# Patient Record
Sex: Male | Born: 1960 | Race: White | Hispanic: No | Marital: Married
Health system: Midwestern US, Community
[De-identification: ages and names within clinical notes are randomized; demographics above are authoritative.]

## PROBLEM LIST (undated history)

## (undated) DIAGNOSIS — F1021 Alcohol dependence, in remission: Secondary | ICD-10-CM

## (undated) DIAGNOSIS — I472 Ventricular tachycardia: Secondary | ICD-10-CM

## (undated) DIAGNOSIS — I4729 Other ventricular tachycardia: Secondary | ICD-10-CM

## (undated) DIAGNOSIS — Z789 Other specified health status: Secondary | ICD-10-CM

## (undated) DIAGNOSIS — E785 Hyperlipidemia, unspecified: Secondary | ICD-10-CM

## (undated) DIAGNOSIS — I255 Ischemic cardiomyopathy: Secondary | ICD-10-CM

## (undated) DIAGNOSIS — I252 Old myocardial infarction: Secondary | ICD-10-CM

## (undated) DIAGNOSIS — I10 Essential (primary) hypertension: Secondary | ICD-10-CM

## (undated) DIAGNOSIS — F329 Major depressive disorder, single episode, unspecified: Secondary | ICD-10-CM

## (undated) DIAGNOSIS — I2510 Atherosclerotic heart disease of native coronary artery without angina pectoris: Secondary | ICD-10-CM

## (undated) HISTORY — PX: CORONARY ANGIOPLASTY WITH STENT PLACEMENT: SHX49

## (undated) HISTORY — DX: Major depressive disorder, single episode, unspecified: F32.9

## (undated) HISTORY — DX: Other specified health status: Z78.9

## (undated) HISTORY — PX: CARDIAC CATHETERIZATION: SHX172

## (undated) HISTORY — DX: Ischemic cardiomyopathy: I25.5

## (undated) HISTORY — PX: WRIST SURGERY: SHX841

## (undated) HISTORY — PX: TONSILLECTOMY: SUR1361

## (undated) HISTORY — DX: Alcohol dependence, in remission: F10.21

## (undated) HISTORY — DX: Old myocardial infarction: I25.2

---

## 1998-05-24 ENCOUNTER — Emergency Department (HOSPITAL_COMMUNITY): Admission: EM | Admit: 1998-05-24 | Discharge: 1998-05-25 | Payer: Self-pay | Admitting: Emergency Medicine

## 1998-06-02 ENCOUNTER — Emergency Department (HOSPITAL_COMMUNITY): Admission: EM | Admit: 1998-06-02 | Discharge: 1998-06-02 | Payer: Self-pay | Admitting: Emergency Medicine

## 2006-06-06 ENCOUNTER — Emergency Department (HOSPITAL_COMMUNITY): Admission: EM | Admit: 2006-06-06 | Discharge: 2006-06-06 | Payer: Self-pay | Admitting: Emergency Medicine

## 2014-02-19 DIAGNOSIS — I252 Old myocardial infarction: Secondary | ICD-10-CM

## 2014-02-19 HISTORY — DX: Old myocardial infarction: I25.2

## 2014-02-28 ENCOUNTER — Inpatient Hospital Stay (HOSPITAL_COMMUNITY)
Admission: EM | Admit: 2014-02-28 | Discharge: 2014-03-01 | DRG: 247 | Disposition: A | Discharge status: Home / Self Care | Payer: Self-pay | Attending: Cardiovascular Disease | Admitting: Cardiovascular Disease

## 2014-02-28 ENCOUNTER — Other Ambulatory Visit: Payer: Self-pay

## 2014-02-28 ENCOUNTER — Emergency Department (HOSPITAL_COMMUNITY): Payer: Self-pay

## 2014-02-28 ENCOUNTER — Encounter (HOSPITAL_COMMUNITY)
Admission: EM | Disposition: A | Discharge status: Home / Self Care | Payer: Self-pay | Source: Home / Self Care | Attending: Cardiovascular Disease

## 2014-02-28 ENCOUNTER — Encounter (HOSPITAL_COMMUNITY): Payer: Self-pay | Admitting: Emergency Medicine

## 2014-02-28 DIAGNOSIS — I213 ST elevation (STEMI) myocardial infarction of unspecified site: Secondary | ICD-10-CM

## 2014-02-28 DIAGNOSIS — E780 Pure hypercholesterolemia: Secondary | ICD-10-CM | POA: Diagnosis present

## 2014-02-28 DIAGNOSIS — R072 Precordial pain: Secondary | ICD-10-CM

## 2014-02-28 DIAGNOSIS — I252 Old myocardial infarction: Secondary | ICD-10-CM | POA: Diagnosis present

## 2014-02-28 DIAGNOSIS — I2510 Atherosclerotic heart disease of native coronary artery without angina pectoris: Secondary | ICD-10-CM

## 2014-02-28 DIAGNOSIS — I472 Ventricular tachycardia: Secondary | ICD-10-CM | POA: Diagnosis not present

## 2014-02-28 DIAGNOSIS — I2119 ST elevation (STEMI) myocardial infarction involving other coronary artery of inferior wall: Principal | ICD-10-CM | POA: Diagnosis present

## 2014-02-28 DIAGNOSIS — I959 Hypotension, unspecified: Secondary | ICD-10-CM | POA: Diagnosis present

## 2014-02-28 DIAGNOSIS — R001 Bradycardia, unspecified: Secondary | ICD-10-CM | POA: Diagnosis present

## 2014-02-28 DIAGNOSIS — I4729 Other ventricular tachycardia: Secondary | ICD-10-CM

## 2014-02-28 DIAGNOSIS — Z8249 Family history of ischemic heart disease and other diseases of the circulatory system: Secondary | ICD-10-CM

## 2014-02-28 DIAGNOSIS — I255 Ischemic cardiomyopathy: Secondary | ICD-10-CM | POA: Diagnosis present

## 2014-02-28 DIAGNOSIS — I2109 ST elevation (STEMI) myocardial infarction involving other coronary artery of anterior wall: Secondary | ICD-10-CM

## 2014-02-28 DIAGNOSIS — F419 Anxiety disorder, unspecified: Secondary | ICD-10-CM | POA: Diagnosis present

## 2014-02-28 HISTORY — PX: LEFT HEART CATH: SHX5478

## 2014-02-28 LAB — MRSA PCR SCREENING: MRSA by PCR: NEGATIVE

## 2014-02-28 LAB — LIPID PANEL
Cholesterol: 163 mg/dL (ref 0–200)
HDL: 54 mg/dL (ref >39)
LDL CALC: 94 mg/dL (ref 0–99)
Total CHOL/HDL Ratio: 3 RATIO
Triglycerides: 73 mg/dL (ref <150)
VLDL: 15 mg/dL (ref 0–40)

## 2014-02-28 LAB — BASIC METABOLIC PANEL
Anion gap: 14 (ref 5–15)
BUN: 12 mg/dL (ref 6–23)
CO2: 22 meq/L (ref 19–32)
Calcium: 8.4 mg/dL (ref 8.4–10.5)
Chloride: 105 mEq/L (ref 96–112)
Creatinine, Ser: 0.8 mg/dL (ref 0.50–1.35)
GFR calc Af Amer: >90 mL/min (ref >90)
GFR calc non Af Amer: >90 mL/min (ref >90)
GLUCOSE: 118 mg/dL — AB (ref 70–99)
POTASSIUM: 4.3 meq/L (ref 3.7–5.3)
SODIUM: 141 meq/L (ref 137–147)

## 2014-02-28 LAB — CBC
HEMATOCRIT: 38.6 % — AB (ref 39.0–52.0)
Hemoglobin: 13.7 g/dL (ref 13.0–17.0)
MCH: 33.2 pg (ref 26.0–34.0)
MCHC: 35.5 g/dL (ref 30.0–36.0)
MCV: 93.5 fL (ref 78.0–100.0)
PLATELETS: 210 10*3/uL (ref 150–400)
RBC: 4.13 MIL/uL — AB (ref 4.22–5.81)
RDW: 12.1 % (ref 11.5–15.5)
WBC: 8.1 10*3/uL (ref 4.0–10.5)

## 2014-02-28 LAB — GLUCOSE, CAPILLARY: GLUCOSE-CAPILLARY: 110 mg/dL — AB (ref 70–99)

## 2014-02-28 LAB — HEMOGLOBIN A1C
Hgb A1c MFr Bld: 5.3 % (ref <5.7)
MEAN PLASMA GLUCOSE: 105 mg/dL (ref <117)

## 2014-02-28 LAB — TROPONIN I: Troponin I: 6.18 ng/mL (ref <0.30)

## 2014-02-28 SURGERY — LEFT HEART CATH
Anesthesia: LOCAL | Laterality: Bilateral

## 2014-02-28 MED ORDER — LISINOPRIL 5 MG PO TABS
5.0000 mg | ORAL_TABLET | Freq: Every day | ORAL | Status: DC
  Administered 2014-02-28 – 2014-03-01 (×2): 5 mg via ORAL
  Filled 2014-02-28 (×2): qty 1

## 2014-02-28 MED ORDER — SODIUM CHLORIDE 0.9 % IV SOLN
INTRAVENOUS | Status: AC
  Administered 2014-02-28 (×2): via INTRAVENOUS

## 2014-02-28 MED ORDER — TICAGRELOR 90 MG PO TABS
ORAL_TABLET | ORAL | Status: AC
  Filled 2014-02-28: qty 2

## 2014-02-28 MED ORDER — ONDANSETRON HCL 4 MG/2ML IJ SOLN: 4.0000 mg | Freq: Four times a day (QID) | INTRAMUSCULAR | Status: DC | PRN

## 2014-02-28 MED ORDER — BIVALIRUDIN 250 MG IV SOLR
INTRAVENOUS | Status: AC
  Filled 2014-02-28: qty 250

## 2014-02-28 MED ORDER — FENTANYL CITRATE 0.05 MG/ML IJ SOLN
INTRAMUSCULAR | Status: AC
  Filled 2014-02-28: qty 2

## 2014-02-28 MED ORDER — TICAGRELOR 90 MG PO TABS
ORAL_TABLET | ORAL | Status: DC
Start: 2014-02-28 — End: 2014-02-28
  Filled 2014-02-28: qty 2

## 2014-02-28 MED ORDER — NITROGLYCERIN 1 MG/10 ML FOR IR/CATH LAB
INTRA_ARTERIAL | Status: AC
  Filled 2014-02-28: qty 10

## 2014-02-28 MED ORDER — ACETAMINOPHEN 325 MG PO TABS
650.0000 mg | ORAL_TABLET | ORAL | Status: DC | PRN
  Administered 2014-02-28 – 2014-03-01 (×2): 650 mg via ORAL
  Filled 2014-02-28 (×2): qty 2

## 2014-02-28 MED ORDER — MORPHINE SULFATE 4 MG/ML IJ SOLN
4.0000 mg | Freq: Once | INTRAMUSCULAR | Status: AC
  Administered 2014-02-28: 4 mg via INTRAVENOUS

## 2014-02-28 MED ORDER — BIVALIRUDIN BOLUS VIA INFUSION
0.1000 mg/kg | Freq: Once | INTRAVENOUS | Status: DC
  Filled 2014-02-28: qty 10

## 2014-02-28 MED ORDER — MORPHINE SULFATE 4 MG/ML IJ SOLN
INTRAMUSCULAR | Status: AC
  Filled 2014-02-28: qty 1

## 2014-02-28 MED ORDER — ATORVASTATIN CALCIUM 80 MG PO TABS
80.0000 mg | ORAL_TABLET | Freq: Every day | ORAL | Status: DC
  Administered 2014-02-28: 80 mg via ORAL
  Filled 2014-02-28 (×2): qty 1

## 2014-02-28 MED ORDER — CARVEDILOL 3.125 MG PO TABS
3.1250 mg | ORAL_TABLET | Freq: Two times a day (BID) | ORAL | Status: DC
  Administered 2014-02-28 (×2): 3.125 mg via ORAL
  Administered 2014-03-01: 6.25 mg via ORAL
  Filled 2014-02-28 (×5): qty 1

## 2014-02-28 MED ORDER — NITROGLYCERIN IN D5W 200-5 MCG/ML-% IV SOLN
INTRAVENOUS | Status: AC
  Filled 2014-02-28: qty 250

## 2014-02-28 MED ORDER — LIDOCAINE HCL (PF) 1 % IJ SOLN
INTRAMUSCULAR | Status: AC
  Filled 2014-02-28: qty 30

## 2014-02-28 MED ORDER — SODIUM CHLORIDE 0.9 % IV SOLN
0.2500 mg/kg/h | INTRAVENOUS | Status: AC
  Administered 2014-02-28: 0.25 mg/kg/h via INTRAVENOUS
  Filled 2014-02-28: qty 250

## 2014-02-28 MED ORDER — TICAGRELOR 90 MG PO TABS
90.0000 mg | ORAL_TABLET | Freq: Two times a day (BID) | ORAL | Status: DC
  Administered 2014-02-28 – 2014-03-01 (×3): 90 mg via ORAL
  Filled 2014-02-28 (×4): qty 1

## 2014-02-28 MED ORDER — METOPROLOL TARTRATE 25 MG PO TABS
25.0000 mg | ORAL_TABLET | Freq: Two times a day (BID) | ORAL | Status: DC
Start: 2014-02-28 — End: 2014-02-28

## 2014-02-28 MED ORDER — ASPIRIN 81 MG PO CHEW
81.0000 mg | CHEWABLE_TABLET | Freq: Every day | ORAL | Status: DC
  Administered 2014-02-28 – 2014-03-01 (×2): 81 mg via ORAL
  Filled 2014-02-28 (×2): qty 1

## 2014-02-28 MED ORDER — HEPARIN (PORCINE) IN NACL 2-0.9 UNIT/ML-% IJ SOLN
INTRAMUSCULAR | Status: AC
  Filled 2014-02-28: qty 1000

## 2014-02-28 MED ORDER — ATROPINE SULFATE 0.1 MG/ML IJ SOLN
INTRAMUSCULAR | Status: AC
  Filled 2014-02-28: qty 10

## 2014-02-28 MED ORDER — VERAPAMIL HCL 2.5 MG/ML IV SOLN
INTRAVENOUS | Status: AC
  Filled 2014-02-28: qty 2

## 2014-02-28 MED ORDER — ASPIRIN 300 MG RE SUPP
300.0000 mg | RECTAL | Status: AC
  Administered 2014-02-28: 300 mg via RECTAL

## 2014-02-28 NOTE — ED Notes (Signed)
The pt is c/o mid-chest pain  For 45 min - 2 hours.  C/o severe pain .  Iv per ems sl nitro  x1 given enroute.  Pt refused po aspirin.    bp 109/ 68 after sl nitro.  Alert on arrival pale skin warm and dry.  No prev  ihistory

## 2014-02-28 NOTE — H&P (Signed)
   Admission History and Physical     Patient ID: Mitchell [name], MRN: [id number], DOB: 05/16/1961 53 y.o. Date of Encounter: 02/28/2014, 4:54 AM  Primary Physician: None  Chief Complaint:  Chest Pain  History of Present Illness: Mitchell [name] is a 53 y.o. male without past medical history, not on any medications, who presents via EMS 2 hours after onset of chest pain, found to have inferior STEMI.  He initially thought he was having heartburn but pain, n/v worsened.  He received SL ntg in the field which made him hypotensive.   States he was in his usual state of health prior to this, he was out drinking this evening.  Drinks 1x/week.  Does not smoke.  Lives at home with his mother.   On exam in the trauma bay he is pale, uncomfortable.  Significant CP.  Vomited aspirin so received dose PR.  4000u heparin bolus given.    No past medical history on file.   No past surgical history on file.    Current Facility-Administered Medications  Medication Dose Route Frequency Provider Last Rate Last Dose  . morphine 4 MG/ML injection           . nitroGLYCERIN 0.2 mg/mL in dextrose 5 % infusion            No current outpatient prescriptions on file.      Allergies: No Known Allergies   Social History:  The patient  does not smoke.  Drinks socially.  Lives at home with his morhter.   Family History:  The patient denies significant family history of CAD  ROS:  Please see the history of present illness.   All other systems reviewed and negative.   Vital Signs: Blood pressure 116/6, pulse 62, resp. rate 18, height 5\' 10"  (1.778 m), weight 90.719 kg (200 lb), SpO2 100.00%.  PHYSICAL EXAM: General:  Ashen, uncomfortable due to chest pain HEENT: normal Lymph: no adenopathy Neck: no JVD Endocrine:  No thryomegaly Vascular: No carotid bruits; FA pulses 2+ bilaterally without bruits Cardiac:  normal S1, S2; RRR; no murmur Lungs:  clear to auscultation bilaterally, no wheezing,  rhonchi or rales Abd: soft, nontender, no hepatomegaly Ext: no edema Musculoskeletal:  No deformities, BUE and BLE strength normal and equal Skin: warm and dry Neuro:  CNs 2-12 intact, no focal abnormalities noted Psych:  Normal affect   EKG:  3mm inferior ST elevation with anterior reciprocal changes.  Anterolateral 1mm ST elevation.   Labs:   No results found for this basename: WBC, HGB, HCT, MCV, PLT   No results found for this basename: NA, K, CL, CO2, BUN, CREATININE, CALCIUM, LABALBU, PROT, BILITOT, ALKPHOS, ALT, AST, GLUCOSE,  in the last 168 hours No results found for this basename: CKTOTAL, CKMB, TROPONINI,  in the last 72 hours No results found for this basename: CHOL, HDL, LDLCALC, TRIG   No results found for this basename: DDIMER    Radiology/Studies:  No results found.   ASSESSMENT AND PLAN:   1. STEMI - to cath lab for primary PCI.  Post cath management per interventional team. - monitor for RV involvement post cath with low threshold to give fluids.  Hold ntg.    Signed,  Yaakov GuthrieJoseph Sivak, MD 02/28/2014, 4:54 AM

## 2014-02-28 NOTE — CV Procedure (Signed)
Cardiac Catheterization Procedure Note  Name: Mitchell [name] MRN: [id number] DOB: 22-Aug-1960  Procedure: Left Heart Cath, Selective Coronary Angiography, LV angiography,  PTCA/Stent of proximal RCA  Indication: acute inferior STEMI   Medications:  Sedation:  0 mg IV Versed, 100 mcg IV Fentanyl  Contrast:  125 ml Omnipaque  Diagnostic Procedure Details: The right ulnar pulse was absent with no detected flow. Thus, I avoided right radial cath. The right groin was prepped, draped, and anesthetized with 1% lidocaine. Using the modified Seldinger technique, a 6 French sheath was introduced into the right femoral artery. Standard Judkins catheters were used for selective coronary angiography and left ventriculography. Catheter exchanges were performed over a wire.  The diagnostic procedure was well-tolerated without immediate complications.  Procedural Findings:  Hemodynamics: AO:  126/80   mmHg LV:  130/2    mmHg LVEDP: 21  mmHg  Coronary angiography: Coronary dominance: right    Left Main:  Normal.   Left Anterior Descending (LAD):   Normal in size with minor irregularities.  1st diagonal (D1):  Large in size with no significant disease.  2nd diagonal (D2):  Small in size with minor irregularities.  3rd diagonal (D3):  Small in size with minor irregularities.  Circumflex (LCx):  Normal in size and nondominant. The vessel has minor irregularities.  1st obtuse marginal:  Medium in size with minor irregularities.  2nd obtuse marginal:  Normal in size with no significant disease.  3rd obtuse marginal:  Medium in size with no significant disease.   Ramus Intermedius:  Normal in size. The vessel has minor irregularities.  Right Coronary Artery: Large in size and dominant. The vessel is occluded proximally which is the culprit for inferior ST elevation myocardial infarction.  Posterior descending artery: Normal  Posterior AV segment: Normal  Posterolateral branchs:   Normal  Left ventriculography: Left ventricular systolic function is mildly reduced , LVEF is estimated at 45-50 %, there is no significant mitral regurgitation . Moderate nasal in fair wall hypokinesis  PCI Procedure Note:  Following the diagnostic procedure, the decision was made to proceed with PCI.  Weight-based bivalirudin was given for anticoagulation. Once a therapeutic ACT was achieved, a 6 JamaicaFrench JR 4 guide catheter was inserted.  A run through coronary guidewire was used to cross the lesion.  The lesion was predilated with a 2.5 x 12 balloon.  The patient has significant sinus bradycardia and was given 1 mg of atropine with improvement. He was also hypotensive but improved with IV fluids without pressors. The lesion was then stented with a 3.5 x 18 Xience drug-eluting stent.  The stent was postdilated with a 4.0 x 15 and then 4.5 x 15 noncompliant balloon.  Following PCI, there was 0% residual stenosis and TIMI-3 flow. Final angiography confirmed an excellent result. Femoral hemostasis was achieved with Perclose.  The patient tolerated the PCI procedure well. There were no immediate procedural complications.  The patient was transferred to the post catheterization recovery area for further monitoring.  PCI Data: Vessel - right coronary artery/Segment - proximal Percent Stenosis (pre)  : 100% TIMI-flow : 0 Stent : 3.5 x 18 Xience drug-eluting stent Percent Stenosis (post) : 0% TIMI-flow (post) 3  Final Conclusions:  1. Severe one-vessel coronary artery disease with thrombotic occlusion of the proximal right coronary artery. Otherwise no obstructive disease. 2. Mildly reduced LV systolic function with an ejection fraction of 45-40% and mildly elevated left ventricular end-diastolic pressure. 3. Successful angioplasty and drug-eluting stent placement to the proximal  right coronary artery.  Recommendations:  Dual antiplatelet therapy for at least 12 months. I started small dose metoprolol.  Consider a small dose ACE inhibitor if blood pressure tolerates.  Lorine BearsMuhammad Arida MD, [facility] 02/28/2014, 5:51 AM

## 2014-02-28 NOTE — ED Notes (Signed)
\  [id number]\

## 2014-02-28 NOTE — Progress Notes (Signed)
CARDIAC REHAB PHASE I   [id number]  Pt refused ambulation, c/o fatigue and chest discomfort. Pt describes as "soreness" unlike his anginal symptoms.  RN made aware.  Education completed including MI book, stent, medication compliance, NTG use and when to call 911, diet and exercise. Pt given instructions how to watch MI video.  Pt oriented to outpatient cardiac rehab program.  At pt request, referral will be sent to [place] [facility].  Understanding verbalized.   Rion, FranklinJoann Hamilton

## 2014-02-28 NOTE — Progress Notes (Signed)
Patient ID: Mitchell LooseDewayne [name], male   DOB: [birth year], 53 y.o.   MRN: [id number]  Patient s/p inferior STEMI, had PCI earlier this morning.  Resting comfortably with no CP.  BP/HR stable, add low dose Coreg and lisinopril.  To get echo today.   Marca AnconaDalton McLean 02/28/2014 8:33 AM

## 2014-02-28 NOTE — ED Notes (Signed)
To cath lab at [phone]

## 2014-02-28 NOTE — ED Provider Notes (Signed)
CSN: [id number]     Arrival date & time 02/28/14  [id number] History   First MD Initiated Contact with Patient 02/28/14 [phone]     Chief Complaint  Patient presents with  . Chest Pain     (Consider location/radiation/quality/duration/timing/severity/associated sxs/prior Treatment) HPI 53 yo male presents to the ER from home via EMS with complaint of chest pain.  Pt was alerted as a CODE STEMI.  Pt with onset of chest pain about an hour prior to arrival.  Pt has been drinking tonight.  He does not see a doctor on a regular basis, takes no medications, does not smoke, and denies family history of CAD.  Pt with inferior ST elevations.  Pt refused aspirin from EMS.  He took 1 ntg and refused others, no change in pain.  Pt has had sob, nausea, diaphoresis.  Pt with drop in BP to systolic of 100 with ntg.   History reviewed. No pertinent past medical history. History reviewed. No pertinent past surgical history. No family history on file. History  Substance Use Topics  . Smoking status: Never Smoker   . Smokeless tobacco: Not on file  . Alcohol Use: No    Review of Systems  Unable to perform ROS: Acuity of condition      Allergies  Review of patient's allergies indicates no known allergies.  Home Medications   Prior to Admission medications   Not on File   BP 118/79  Pulse 74  Temp(Src) 98.4 F (36.9 C) (Oral)  Resp 14  Ht 5\' 11"  (1.803 m)  Wt 215 lb 6.2 oz (97.7 kg)  BMI 30.05 kg/m2  SpO2 100% Physical Exam  Nursing note and vitals reviewed. Constitutional: He is oriented to person, place, and time. He appears well-developed and well-nourished. He appears distressed.  HENT:  Head: Normocephalic and atraumatic.  Nose: Nose normal.  Mouth/Throat: Oropharynx is clear and moist.  Eyes: Conjunctivae and EOM are normal. Pupils are equal, round, and reactive to light.  Neck: Normal range of motion. Neck supple. No JVD present. No tracheal deviation present. No thyromegaly present.   Cardiovascular: Normal rate, regular rhythm, normal heart sounds and intact distal pulses.  Exam reveals no gallop and no friction rub.   No murmur heard. Pulmonary/Chest: Effort normal and breath sounds normal. No stridor. No respiratory distress. He has no wheezes. He has no rales. He exhibits no tenderness.  Abdominal: Soft. Bowel sounds are normal. He exhibits no distension and no mass. There is no tenderness. There is no rebound and no guarding.  Musculoskeletal: Normal range of motion. He exhibits no edema and no tenderness.  Lymphadenopathy:    He has no cervical adenopathy.  Neurological: He is alert and oriented to person, place, and time. He displays normal reflexes. He exhibits normal muscle tone. Coordination normal.  Skin: Skin is warm. No rash noted. He is diaphoretic. No erythema. There is pallor.    ED Course  Procedures (including critical care time) Labs Review Labs Reviewed  BASIC METABOLIC PANEL - Abnormal; Notable for the following:    Glucose, Bld 118 (*)    All other components within normal limits  CBC - Abnormal; Notable for the following:    RBC 4.13 (*)    HCT 38.6 (*)    All other components within normal limits  TROPONIN I - Abnormal; Notable for the following:    Troponin I 6.18 (*)    All other components within normal limits  MRSA PCR SCREENING  LIPID PANEL  HEMOGLOBIN A1C    Imaging Review Dg Chest Portable 1 View  02/28/2014   CLINICAL DATA:  Code ST elevation myocardial infarction. Initial encounter.  EXAM: PORTABLE CHEST - 1 VIEW  COMPARISON:  None.  FINDINGS: The lungs are well-aerated and clear. There is no evidence of focal opacification, pleural effusion or pneumothorax.  The cardiomediastinal silhouette is borderline normal in size. No acute osseous abnormalities are seen.  IMPRESSION: No acute cardiopulmonary process seen.   Electronically Signed   By: Roanna RaiderJeffery  Chang M.D.   On: 02/28/2014 05:44     EKG Interpretation   Date/Time:   Saturday February 28 2014 04:29:50 EDT Ventricular Rate:  67 PR Interval:  243 QRS Duration: 106 QT Interval:  396 QTC Calculation: 418 R Axis:   -15 Text Interpretation:  Prolonged PR interval Inferior infarct, acute (RCA)  Borderline ST elevation, anterior leads Lateral leads are also involved  Probable RV involvement, suggest recording right precordial leads Baseline  wander in lead(s) II III aVF V3 V6 ** ** ACUTE MI / STEMI ** ** No old  tracing to compare Confirmed by Josha Weekley  MD, Caidon Foti ([id number]) on 02/28/2014  8:41:59 AM     CRITICAL CARE Performed by: Olivia MackieTTER,Altair Appenzeller M Total critical care time: 60 min Critical care time was exclusive of separately billable procedures and treating other patients. Critical care was necessary to treat or prevent imminent or life-threatening deterioration. Critical care was time spent personally by me on the following activities: development of treatment plan with patient and/or surrogate as well as nursing, discussions with consultants, evaluation of patient's response to treatment, examination of patient, obtaining history from patient or surrogate, ordering and performing treatments and interventions, ordering and review of laboratory studies, ordering and review of radiographic studies, pulse oximetry and re-evaluation of patient's condition.  MDM   Final diagnoses:  ST elevation myocardial infarction (STEMI), unspecified artery    53 yo male with inferior MI.  Pt to be taken emergently to the cath lab for PCI.  Pt has received rectal aspirin, heparin, morphine.  Pt is critically ill,     Olivia Mackielga M Akaash Vandewater, MD 02/28/14 [phone]

## 2014-02-28 NOTE — ED Notes (Signed)
Heparin 4000 uinits iv

## 2014-02-28 NOTE — Progress Notes (Signed)
CRITICAL VALUE ALERT  Critical value received:  Troponin 6.18-post PCI  Date of notification:  02/28/14  Time of notification:  0800  Critical value read back:Yes.    Nurse who received alert:  Wilfred CurtisMilford, Uzma Hellmer Marie   MD notified (1st page):    Time of first page:    MD notified (2nd page):  Time of second page:  Responding MD:    Time MD responded:

## 2014-02-28 NOTE — Progress Notes (Signed)
02/28/14 0600  Clinical Encounter Type  Visited With Patient;Family;Health care provider  Visit Type Initial;Code;ED  Stress Factors  Family Stress Factors Health changes;Lack of knowledge   Chaplain responded to a code stemi page at 4:11AM. Patient was placed in Trauma C briefly but was not able to speak much. Chaplain was notified that the patient's mother was in the waiting room. Patient was moved to the Cath Lab. Chaplain escorted patient's mother to the short stay waiting room. Patient was emotional and worried but understood that all she could do was wait for the patient's procedure to conclude for more information. Chaplain informed the medical team in the cath lab of the mother's presence. Chaplain sat with patient's mother as the physician informed her of how the procedure went. During this consultation the patient's mother explained that the patient has been under a lot of stress, especially financially and has also been drinking alcohol and not eating well. Patient's mother also requested a consult with a CSW regarding financial concerns with the patient's medications. Chaplain passed on this information to the patient's RN. Chaplain escorted patient's mother to the 2H waiting area. Patient was alert and talking and patient's mother was able to visit. Chaplain will continue to provide emotional and spiritual support for patient and patient's family as needed. Cranston NeighborStrother, Taniesha Glanz R, Chaplain 6:55 AM

## 2014-03-01 DIAGNOSIS — I472 Ventricular tachycardia: Secondary | ICD-10-CM

## 2014-03-01 DIAGNOSIS — I255 Ischemic cardiomyopathy: Secondary | ICD-10-CM

## 2014-03-01 LAB — BASIC METABOLIC PANEL
ANION GAP: 15 (ref 5–15)
BUN: 16 mg/dL (ref 6–23)
CHLORIDE: 104 meq/L (ref 96–112)
CO2: 21 mEq/L (ref 19–32)
CREATININE: 0.91 mg/dL (ref 0.50–1.35)
Calcium: 8.5 mg/dL (ref 8.4–10.5)
GFR calc Af Amer: >90 mL/min (ref >90)
Glucose, Bld: 102 mg/dL — ABNORMAL HIGH (ref 70–99)
POTASSIUM: 3.8 meq/L (ref 3.7–5.3)
Sodium: 140 mEq/L (ref 137–147)

## 2014-03-01 LAB — CBC
HCT: 38.4 % — ABNORMAL LOW (ref 39.0–52.0)
HEMOGLOBIN: 13.1 g/dL (ref 13.0–17.0)
MCH: 32.5 pg (ref 26.0–34.0)
MCHC: 34.1 g/dL (ref 30.0–36.0)
MCV: 95.3 fL (ref 78.0–100.0)
Platelets: 207 10*3/uL (ref 150–400)
RBC: 4.03 MIL/uL — ABNORMAL LOW (ref 4.22–5.81)
RDW: 12.2 % (ref 11.5–15.5)
WBC: 8 10*3/uL (ref 4.0–10.5)

## 2014-03-01 LAB — MAGNESIUM: MAGNESIUM: 2.1 mg/dL (ref 1.5–2.5)

## 2014-03-01 MED ORDER — LOVASTATIN 40 MG PO TABS: 40.0000 mg | ORAL_TABLET | Freq: Every day | ORAL | Status: DC

## 2014-03-01 MED ORDER — LISINOPRIL 5 MG PO TABS: 5.0000 mg | ORAL_TABLET | Freq: Every day | ORAL | Status: DC

## 2014-03-01 MED ORDER — TICAGRELOR 90 MG PO TABS: 90.0000 mg | ORAL_TABLET | Freq: Two times a day (BID) | ORAL | Status: DC

## 2014-03-01 MED ORDER — ALPRAZOLAM 0.5 MG PO TABS: 0.5000 mg | ORAL_TABLET | Freq: Two times a day (BID) | ORAL | Status: DC | PRN

## 2014-03-01 MED ORDER — CARVEDILOL 6.25 MG PO TABS
6.2500 mg | ORAL_TABLET | Freq: Two times a day (BID) | ORAL | Status: DC
  Filled 2014-03-01 (×2): qty 1

## 2014-03-01 MED ORDER — ALPRAZOLAM 0.5 MG PO TABS
0.5000 mg | ORAL_TABLET | Freq: Two times a day (BID) | ORAL | Status: DC | PRN
  Administered 2014-03-01 (×2): 0.5 mg via ORAL
  Filled 2014-03-01 (×2): qty 1

## 2014-03-01 MED ORDER — CARVEDILOL 6.25 MG PO TABS: 6.2500 mg | ORAL_TABLET | Freq: Two times a day (BID) | ORAL | Status: DC

## 2014-03-01 MED ORDER — ASPIRIN 81 MG PO CHEW: 81.0000 mg | CHEWABLE_TABLET | Freq: Every day | ORAL | Status: DC

## 2014-03-01 NOTE — Progress Notes (Signed)
Attempted to call report to 2W x2. Was told that receiving RN will call back to get report.   Alba DestineMisty L Ennis, RN, BSN

## 2014-03-01 NOTE — Care Management Note (Signed)
    Page 1 of 1   03/01/2014     3:42:28 PM CARE MANAGEMENT NOTE 03/01/2014  Patient:  Bartl,DEWAYNE   Account Number:  [id number]  Date Initiated:  03/01/2014  Documentation initiated by:  [facility]  Subjective/Objective Assessment:   adm: 1. STEMI  2. S/P PCI of RCA DES     Action/Plan:   discharge planning   Anticipated DC Date:  03/01/2014   Anticipated DC Plan:  [place]  CM consult  [facility] [facility]  Medication Assistance      Choice offered to / List presented to:             Status of service:  Completed, signed off Medicare Important Message given?   (If response is "NO", the following Medicare IM given date fields will be blank) Date Medicare IM given:   Medicare IM given by:   Date Additional Medicare IM given:   Additional Medicare IM given by:    Discharge Disposition:  HOME/SELF CARE  Per UR Regulation:    If discussed at Long Length of Stay Meetings, dates discussed:    Comments:  03/01/14 15:30 CM met with pt in room and gave him a free 30 day trial card for Brilinta; a [place] letter with a list of participating pharmacies; and a [facility] pamphlet.  Pt verbalized understanding 30 Day Brilinta card should give the [facility] enough time to get hime established to arrange refills; pt verbalized understanding of [place] letter and it's parameters; pt verbalized understanding he is to [place] to the [facility] any weekday morning from 9-10 and ask for: AN [place]; AN APPOINTMENT FOR A NAVIGATOR TO [place]; and AN APPOINTMENT FOR A FOLLOW UP CARE APPOINTMENT.  No other CM needs were communicated.  Mariane Masters, BSN, CM [phone].

## 2014-03-01 NOTE — Progress Notes (Signed)
SUBJECTIVE: No further chest pain.  No dyspnea.  He is not sleeping well.  Had 25 beat run NSVT last night.   Scheduled Meds: . aspirin  81 mg Oral Daily  . atorvastatin  80 mg Oral q1800  . carvedilol  6.25 mg Oral BID WC  . lisinopril  5 mg Oral Daily  . ticagrelor  90 mg Oral BID   Continuous Infusions:  PRN Meds:.acetaminophen, ALPRAZolam, ondansetron (ZOFRAN) IV    Filed Vitals:   03/01/14 0400 03/01/14 0500 03/01/14 0600 03/01/14 0700  BP: 107/53 115/65 106/52 121/66  Pulse: 59 64 57 74  Temp: 98.4 F (36.9 C)     TempSrc: Oral     Resp: 18 19 20 17   Height:      Weight:      SpO2: 96% 96% 99% 99%    Intake/Output Summary (Last 24 hours) at 03/01/14 0807 Last data filed at 03/01/14 0700  Gross per 24 hour  Intake 2107.5 ml  Output   2675 ml  Net -567.5 ml    LABS: Basic Metabolic Panel:  Recent Labs  16/02/9609/10/15 0700 03/01/14 0253  NA 141 140  K 4.3 3.8  CL 105 104  CO2 22 21  GLUCOSE 118* 102*  BUN 12 16  CREATININE 0.80 0.91  CALCIUM 8.4 8.5   Liver Function Tests: No results found for this basename: AST, ALT, ALKPHOS, BILITOT, PROT, ALBUMIN,  in the last 72 hours No results found for this basename: LIPASE, AMYLASE,  in the last 72 hours CBC:  Recent Labs  02/28/14 0700 03/01/14 0253  WBC 8.1 8.0  HGB 13.7 13.1  HCT 38.6* 38.4*  MCV 93.5 95.3  PLT 210 207   Cardiac Enzymes:  Recent Labs  02/28/14 0700  TROPONINI 6.18*   BNP: No components found with this basename: POCBNP,  D-Dimer: No results found for this basename: DDIMER,  in the last 72 hours Hemoglobin A1C:  Recent Labs  02/28/14 0700  HGBA1C 5.3   Fasting Lipid Panel:  Recent Labs  02/28/14 0700  CHOL 163  HDL 54  LDLCALC 94  TRIG 73  CHOLHDL 3.0   Thyroid Function Tests: No results found for this basename: TSH, T4TOTAL, FREET3, T3FREE, THYROIDAB,  in the last 72 hours Anemia Panel: No results found for this basename: VITAMINB12, FOLATE, FERRITIN, TIBC,  IRON, RETICCTPCT,  in the last 72 hours  RADIOLOGY: Dg Chest Portable 1 View  02/28/2014   CLINICAL DATA:  Code ST elevation myocardial infarction. Initial encounter.  EXAM: PORTABLE CHEST - 1 VIEW  COMPARISON:  None.  FINDINGS: The lungs are well-aerated and clear. There is no evidence of focal opacification, pleural effusion or pneumothorax.  The cardiomediastinal silhouette is borderline normal in size. No acute osseous abnormalities are seen.  IMPRESSION: No acute cardiopulmonary process seen.   Electronically Signed   By: Roanna RaiderJeffery  Chang M.D.   On: 02/28/2014 05:44    PHYSICAL EXAM General: NAD Neck: No JVD, no thyromegaly or thyroid nodule.  Lungs: Clear to auscultation bilaterally with normal respiratory effort. CV: Nondisplaced PMI.  Heart regular S1/S2, no S3/S4, no murmur.  No peripheral edema.  No carotid bruit.  Normal pedal pulses.  Abdomen: Soft, nontender, no hepatosplenomegaly, no distention.  Neurologic: Alert and oriented x 3.  Psych: Normal affect. Extremities: No clubbing or cyanosis.   TELEMETRY: Reviewed telemetry pt in NSR, 25 beat run NSVT last night  ASSESSMENT AND PLAN: [age] yo with family history of premature CAD (brother) presented  with inferior MI after heavy drinking, found to have thrombotic occlusion proximal RCA s/p DES early am 10/10.  1. CAD: s/p DES to proximal RCA. Doing well, no chest pain.  - Walk with rehab - May go to telemetry.  - Continue ASA, ticagrelor, atorvastatin, Coreg, and lisinopril. 2. Ischemic cardiomyopathy: EF 45-50% on LV-gram, echo report not in computer though done yesterday.  - Will need to review echo.  - Increase Coreg to 6.25 mg bid, continue lisinopril.  3. NSVT: Run 25 beats NSVT around 24 hours post-PCI.  I will watch him one more night in the hospital.  As above, increasing Coreg and will add magnesium level to labs.  4. Disposition: Home tomorrow if stable.   Mitchell [name] 03/01/2014 8:12 AM

## 2014-03-01 NOTE — Discharge Summary (Signed)
Physician Discharge Summary  Patient ID: Mitchell [name] MRN: [id number] DOB/AGE: 1961-04-01 53 y.o.  Admit date: 02/28/2014 Discharge date: 03/01/2014  Primary Discharge Diagnosis 1. STEMI 2. S/P PCI of RCA DES  Secondary Discharge Diagnosis 1.  Hypercholesterolemia 2. Anxiety   Primary Cardiologist: McLean,Dalton  Significant Diagnostic Studies: 1 Cardiac Cath 02/28/2014  Dr. Kirke CorinArida Coronary angiography:  Coronary dominance: right  Left Main: Normal.  Left Anterior Descending (LAD): Normal in size with minor irregularities.  1st diagonal (D1): Large in size with no significant disease.  2nd diagonal (D2): Small in size with minor irregularities.  3rd diagonal (D3): Small in size with minor irregularities.  Circumflex (LCx): Normal in size and nondominant. The vessel has minor irregularities.  1st obtuse marginal: Medium in size with minor irregularities.  2nd obtuse marginal: Normal in size with no significant disease.  3rd obtuse marginal: Medium in size with no significant disease.  Ramus Intermedius: Normal in size. The vessel has minor irregularities.  Right Coronary Artery: Large in size and dominant. The vessel is occluded proximally which is the culprit for inferior ST elevation myocardial infarction.  Posterior descending artery: Normal  Posterior AV segment: Normal  Posterolateral branchs: Normal Left ventriculography: Left ventricular systolic function is mildly reduced , LVEF is estimated at 45-50 %, there is no significant mitral regurgitation . Moderate nasal in fair wall hypokinesis  2. PCI Data:  Vessel - right coronary artery/Segment - proximal  Percent Stenosis (pre) : 100%  TIMI-flow : 0  Stent : 3.5 x 18 Xience drug-eluting stent  Percent Stenosis (post) : 0%  TIMI-flow (post) 3  3. Echocardiogram Left ventricle: The cavity size was normal. Wall thickness was normal. Systolic function was normal. The estimated ejection fraction was in the  range of 55% to 60%. There is hypokinesis of the basal-midinferolateral and inferior myocardium. Doppler parameters are consistent with abnormal left ventricular relaxation (grade 1 diastolic dysfunction). - Aortic valve: Mildly calcified annulus. Trileaflet. There was no significant regurgitation. - Left atrium: The atrium was at the upper limits of normal in size. - Right atrium: Central venous pressure (est): 3 mm Hg. 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 No defect or patent foramen ovale was identified. - Tricuspid valve: There was trivial regurgitation. - Pulmonary arteries: Systolic pressure could not be accurately estimated. - Pericardium, extracardiac: There was no pericardial effusion.  Impressions:  - Normal wall thickness with LVEF 55-60%, mid to basal inferolateral hypokinesis, grade 1 diastolic dysfunction. Upper normal left atrial size. 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 Unable to assess PASP.   Consults:  None  Hospital Course:   Mitchell [name] is a 53 year old male patient with no prior cardiac history, or any other medical history. He presented to the emergency room via EMS 2 hours after onset of chest pain was found to have inferior ST elevation MI. The patient senses were heartburn, but not pain with nausea and vomiting.   He was sent emergently to the cardiac catheterization lab where a cardiac catheterization was completed by Dr.Arida. Results revealed severe one vessel coronary artery disease with thrombotic occlusion of the proximal right coronary artery. Otherwise, no obstructive disease. He had mildly reduced LV function with EF of 4045% with mildly elevated LV and diastolic pressure. The patient underwent successful angioplasty with drug-eluting stent placement to the proximal right coronary artery.   Overnight, after his procedure. He did have episode of  nonsustained ventricular tachycardia.  He was seen by Dr. Sharol Harness on rounds and was asked to stay over one more night to evaluate for further ventricular arrhythmias. The patient was unhappy about having to stay. His mother arrived to visit and he was adamant about going home to include leaving AMA. The patient is being discharged today after discussed with Dr. Sharol Harness. He is provided. Prescriptions for antiplatelet therapy, to include a 30 day free coupon for Brlinta. This is gone over in great detail with his mother prior to him leaving. A followup appointment was requested in our office for post hospitalization followup. It is uncertain whether he will keep that appointment.      Discharge Exam: Labs:   Lab Results  Component Value Date   WBC 8.0 03/01/2014   HGB 13.1 03/01/2014   HCT 38.4* 03/01/2014   MCV 95.3 03/01/2014   PLT 207 03/01/2014     Recent Labs Lab 03/01/14 0253  NA 140  K 3.8  CL 104  CO2 21  BUN 16  CREATININE 0.91  CALCIUM 8.5  GLUCOSE 102*   Lab Results  Component Value Date   TROPONINI 6.18* 02/28/2014    Lab Results  Component Value Date   CHOL 163 02/28/2014   Lab Results  Component Value Date   HDL 54 02/28/2014   Lab Results  Component Value Date   LDLCALC 94 02/28/2014   Lab Results  Component Value Date   TRIG 73 02/28/2014   Lab Results  Component Value Date   CHOLHDL 3.0 02/28/2014   No results found for this basename: LDLDIRECT      Radiology: Dg Chest Portable 1 View  02/28/2014   CLINICAL DATA:  Code ST elevation myocardial infarction. Initial encounter.  EXAM: PORTABLE CHEST - 1 VIEW  COMPARISON:  None.  FINDINGS: The lungs are well-aerated and clear. There is no evidence of focal opacification, pleural effusion or pneumothorax.  The cardiomediastinal silhouette is borderline normal in size. No acute osseous abnormalities are seen.  IMPRESSION: No acute cardiopulmonary process seen.   Electronically Signed   By: Roanna Raider  M.D.   On: 02/28/2014 05:44    WUJ:WJXBJ bradycardia with 1st degree A-V block with Premature atrial complexes Left anterior fascicular block Inferior infarct , age undetermined T wave abnormality, consider lateral ischemia  FOLLOW UP PLANS AND APPOINTMENTS     Discharge Instructions   Diet - low sodium heart healthy    Complete by:  As directed      Increase activity slowly    Complete by:  As directed             Medication List         ALPRAZolam 0.5 MG tablet  Commonly known as:  XANAX  Take 1 tablet (0.5 mg total) by mouth 2 (two) times daily as needed for anxiety.     aspirin 81 MG chewable tablet  Chew 1 tablet (81 mg total) by mouth daily.     carvedilol 6.25 MG tablet  Commonly known as:  COREG  Take 1 tablet (6.25 mg total) by mouth 2 (two) times daily with a meal.     lisinopril 5 MG tablet  Commonly known as:  PRINIVIL,ZESTRIL  Take 1 tablet (5 mg total) by mouth daily.     lovastatin 40 MG tablet  Commonly known as:  MEVACOR  Take 1 tablet (40 mg total) by mouth at bedtime.     ticagrelor 90 MG Tabs tablet  Commonly known as:  [employer]  Take 1 tablet (90 mg total) by mouth 2 (two) times daily.     ticagrelor 90 MG Tabs tablet  Commonly known as:  BRILINTA  Take 1 tablet (90 mg total) by mouth 2 (two) times daily.       Follow-up Information   Follow up with [facility]([place]). (Our office will call you for appt.)    Specialty:  Cardiology   Contact information:   [address], [address] [place] [address] [phone]        Time spent with patient to include physician time:35 minutes  Signed: Bettey MareKathryn M. Lawrence NP  03/01/2014, 3:15 PM Co-Sign MD

## 2014-03-01 NOTE — Progress Notes (Signed)
Order for transfer verified, pt informed and verbalized understanding. Room 2W10 available, belongings packed. Elink notified. Pt up and out of bed. C/o pain to back (sciatica), refused pain med and heating pack. Pt transported to 2W via wheelchair with belongings. Pt stated he would leave hospital as soon as mother arrived with clothes,Pt very irritable apologize to RN for behavior but wants to go home.Reiterated the need to be here since just had a heart injury, pt verbalized understanding. Receiving nurse updated. Will cont to monitor.

## 2014-03-01 NOTE — Progress Notes (Signed)
Pt. Refused to stay was going to leave [facility]a but the pa came up and discharged him. He refused to go over his discharge papers so I handed them to him and he left

## 2014-03-02 LAB — POCT I-STAT, CHEM 8
BUN: 12 mg/dL (ref 6–23)
CREATININE: 0.7 mg/dL (ref 0.50–1.35)
Calcium, Ion: 1.11 mmol/L — ABNORMAL LOW (ref 1.12–1.23)
Chloride: 107 mEq/L (ref 96–112)
GLUCOSE: 138 mg/dL — AB (ref 70–99)
HEMATOCRIT: 39 % (ref 39.0–52.0)
Hemoglobin: 13.3 g/dL (ref 13.0–17.0)
POTASSIUM: 3.5 meq/L — AB (ref 3.7–5.3)
Sodium: 145 mEq/L (ref 137–147)
TCO2: 21 mmol/L (ref 0–100)

## 2014-03-02 LAB — POCT ACTIVATED CLOTTING TIME: Activated Clotting Time: 394 seconds

## 2014-03-02 MED FILL — Sodium Chloride IV Soln 0.9%: INTRAVENOUS | Qty: 50 | Status: AC

## 2014-03-04 ENCOUNTER — Inpatient Hospital Stay (HOSPITAL_COMMUNITY)
Admission: EM | Admit: 2014-03-04 | Discharge: 2014-03-06 | DRG: 392 | Disposition: A | Discharge status: Home / Self Care | Payer: Self-pay | Attending: Internal Medicine | Admitting: Internal Medicine

## 2014-03-04 ENCOUNTER — Other Ambulatory Visit: Payer: Self-pay

## 2014-03-04 ENCOUNTER — Emergency Department (HOSPITAL_COMMUNITY): Payer: Self-pay

## 2014-03-04 ENCOUNTER — Encounter (HOSPITAL_COMMUNITY): Payer: Self-pay | Admitting: Emergency Medicine

## 2014-03-04 ENCOUNTER — Observation Stay (HOSPITAL_COMMUNITY): Payer: Self-pay

## 2014-03-04 DIAGNOSIS — I25118 Atherosclerotic heart disease of native coronary artery with other forms of angina pectoris: Secondary | ICD-10-CM

## 2014-03-04 DIAGNOSIS — K5900 Constipation, unspecified: Principal | ICD-10-CM

## 2014-03-04 DIAGNOSIS — E785 Hyperlipidemia, unspecified: Secondary | ICD-10-CM

## 2014-03-04 DIAGNOSIS — F411 Generalized anxiety disorder: Secondary | ICD-10-CM

## 2014-03-04 DIAGNOSIS — I2119 ST elevation (STEMI) myocardial infarction involving other coronary artery of inferior wall: Secondary | ICD-10-CM

## 2014-03-04 DIAGNOSIS — N200 Calculus of kidney: Secondary | ICD-10-CM | POA: Diagnosis present

## 2014-03-04 DIAGNOSIS — R0789 Other chest pain: Secondary | ICD-10-CM

## 2014-03-04 DIAGNOSIS — R079 Chest pain, unspecified: Secondary | ICD-10-CM

## 2014-03-04 DIAGNOSIS — R0602 Shortness of breath: Secondary | ICD-10-CM

## 2014-03-04 DIAGNOSIS — I252 Old myocardial infarction: Secondary | ICD-10-CM

## 2014-03-04 DIAGNOSIS — R1010 Upper abdominal pain, unspecified: Secondary | ICD-10-CM

## 2014-03-04 DIAGNOSIS — R109 Unspecified abdominal pain: Secondary | ICD-10-CM

## 2014-03-04 DIAGNOSIS — R0600 Dyspnea, unspecified: Secondary | ICD-10-CM

## 2014-03-04 DIAGNOSIS — F419 Anxiety disorder, unspecified: Secondary | ICD-10-CM

## 2014-03-04 DIAGNOSIS — I2510 Atherosclerotic heart disease of native coronary artery without angina pectoris: Secondary | ICD-10-CM | POA: Diagnosis present

## 2014-03-04 DIAGNOSIS — I472 Ventricular tachycardia: Secondary | ICD-10-CM | POA: Diagnosis present

## 2014-03-04 HISTORY — DX: Atherosclerotic heart disease of native coronary artery without angina pectoris: I25.10

## 2014-03-04 HISTORY — DX: Atherosclerotic heart disease of native coronary artery with other forms of angina pectoris: I25.118

## 2014-03-04 HISTORY — DX: Hyperlipidemia, unspecified: E78.5

## 2014-03-04 HISTORY — DX: Other ventricular tachycardia: I47.29

## 2014-03-04 HISTORY — DX: Generalized anxiety disorder: F41.1

## 2014-03-04 HISTORY — DX: Ventricular tachycardia: I47.2

## 2014-03-04 HISTORY — DX: Anxiety disorder, unspecified: F41.9

## 2014-03-04 LAB — CBC WITH DIFFERENTIAL/PLATELET
BASOS PCT: 1 % (ref 0–1)
Basophils Absolute: 0 10*3/uL (ref 0.0–0.1)
Eosinophils Absolute: 0.2 10*3/uL (ref 0.0–0.7)
Eosinophils Relative: 3 % (ref 0–5)
HCT: 43.6 % (ref 39.0–52.0)
HEMOGLOBIN: 15.8 g/dL (ref 13.0–17.0)
Lymphocytes Relative: 20 % (ref 12–46)
Lymphs Abs: 1.5 10*3/uL (ref 0.7–4.0)
MCH: 33.2 pg (ref 26.0–34.0)
MCHC: 36.2 g/dL — ABNORMAL HIGH (ref 30.0–36.0)
MCV: 91.6 fL (ref 78.0–100.0)
MONO ABS: 0.6 10*3/uL (ref 0.1–1.0)
MONOS PCT: 8 % (ref 3–12)
NEUTROS ABS: 5 10*3/uL (ref 1.7–7.7)
Neutrophils Relative %: 68 % (ref 43–77)
Platelets: 230 10*3/uL (ref 150–400)
RBC: 4.76 MIL/uL (ref 4.22–5.81)
RDW: 11.8 % (ref 11.5–15.5)
WBC: 7.3 10*3/uL (ref 4.0–10.5)

## 2014-03-04 LAB — COMPREHENSIVE METABOLIC PANEL
ALBUMIN: 4.1 g/dL (ref 3.5–5.2)
ALT: 16 U/L (ref 0–53)
ANION GAP: 17 — AB (ref 5–15)
AST: 22 U/L (ref 0–37)
Alkaline Phosphatase: 122 U/L — ABNORMAL HIGH (ref 39–117)
BILIRUBIN TOTAL: 0.8 mg/dL (ref 0.3–1.2)
BUN: 17 mg/dL (ref 6–23)
CHLORIDE: 99 meq/L (ref 96–112)
CO2: 22 meq/L (ref 19–32)
Calcium: 9.6 mg/dL (ref 8.4–10.5)
Creatinine, Ser: 1.05 mg/dL (ref 0.50–1.35)
GFR calc Af Amer: >90 mL/min (ref >90)
GFR, EST NON AFRICAN AMERICAN: 79 mL/min — AB (ref >90)
Glucose, Bld: 82 mg/dL (ref 70–99)
Potassium: 4.2 mEq/L (ref 3.7–5.3)
Sodium: 138 mEq/L (ref 137–147)
Total Protein: 8.1 g/dL (ref 6.0–8.3)

## 2014-03-04 LAB — HEPARIN LEVEL (UNFRACTIONATED): Heparin Unfractionated: 0.35 IU/mL (ref 0.30–0.70)

## 2014-03-04 LAB — TROPONIN I
TROPONIN I: 1.92 ng/mL — AB (ref <0.30)
Troponin I: 1.63 ng/mL (ref <0.30)

## 2014-03-04 LAB — SEDIMENTATION RATE: Sed Rate: 30 mm/hr — ABNORMAL HIGH (ref 0–16)

## 2014-03-04 LAB — PRO B NATRIURETIC PEPTIDE: Pro B Natriuretic peptide (BNP): 16.2 pg/mL (ref 0–125)

## 2014-03-04 MED ORDER — HEPARIN BOLUS VIA INFUSION
4000.0000 [IU] | Freq: Once | INTRAVENOUS | Status: AC
  Administered 2014-03-04: 4000 [IU] via INTRAVENOUS
  Filled 2014-03-04: qty 4000

## 2014-03-04 MED ORDER — ASPIRIN 81 MG PO CHEW
81.0000 mg | CHEWABLE_TABLET | Freq: Every day | ORAL | Status: DC
  Administered 2014-03-05 – 2014-03-06 (×2): 81 mg via ORAL
  Filled 2014-03-04 (×2): qty 1

## 2014-03-04 MED ORDER — PANTOPRAZOLE SODIUM 40 MG PO TBEC
40.0000 mg | DELAYED_RELEASE_TABLET | Freq: Every day | ORAL | Status: DC
  Administered 2014-03-05 – 2014-03-06 (×2): 40 mg via ORAL
  Filled 2014-03-04 (×2): qty 1

## 2014-03-04 MED ORDER — MORPHINE SULFATE 4 MG/ML IJ SOLN
4.0000 mg | Freq: Once | INTRAMUSCULAR | Status: AC
  Administered 2014-03-04: 4 mg via INTRAVENOUS
  Filled 2014-03-04: qty 1

## 2014-03-04 MED ORDER — ALPRAZOLAM 0.5 MG PO TABS
0.5000 mg | ORAL_TABLET | Freq: Two times a day (BID) | ORAL | Status: DC | PRN
  Administered 2014-03-04 – 2014-03-05 (×3): 0.5 mg via ORAL
  Filled 2014-03-04 (×2): qty 1
  Filled 2014-03-04: qty 2

## 2014-03-04 MED ORDER — HEPARIN (PORCINE) IN NACL 100-0.45 UNIT/ML-% IJ SOLN
1400.0000 [IU]/h | INTRAMUSCULAR | Status: DC
  Administered 2014-03-04: 1200 [IU]/h via INTRAVENOUS
  Administered 2014-03-05 – 2014-03-06 (×2): 1400 [IU]/h via INTRAVENOUS
  Filled 2014-03-04 (×3): qty 250

## 2014-03-04 MED ORDER — DOCUSATE SODIUM 100 MG PO CAPS
100.0000 mg | ORAL_CAPSULE | Freq: Every day | ORAL | Status: DC
  Administered 2014-03-05: 100 mg via ORAL
  Filled 2014-03-04 (×2): qty 1

## 2014-03-04 MED ORDER — ONDANSETRON HCL 4 MG/2ML IJ SOLN
4.0000 mg | Freq: Once | INTRAMUSCULAR | Status: AC
  Administered 2014-03-04: 4 mg via INTRAVENOUS
  Filled 2014-03-04: qty 2

## 2014-03-04 MED ORDER — LISINOPRIL 5 MG PO TABS
5.0000 mg | ORAL_TABLET | Freq: Every day | ORAL | Status: DC
  Administered 2014-03-05 – 2014-03-06 (×2): 5 mg via ORAL
  Filled 2014-03-04 (×2): qty 1

## 2014-03-04 MED ORDER — CARVEDILOL 3.125 MG PO TABS
3.1250 mg | ORAL_TABLET | Freq: Two times a day (BID) | ORAL | Status: DC
  Administered 2014-03-05 – 2014-03-06 (×2): 3.125 mg via ORAL
  Filled 2014-03-04 (×4): qty 1

## 2014-03-04 MED ORDER — ONDANSETRON HCL 4 MG/2ML IJ SOLN: 4.0000 mg | Freq: Four times a day (QID) | INTRAMUSCULAR | Status: DC | PRN

## 2014-03-04 MED ORDER — MORPHINE SULFATE 4 MG/ML IJ SOLN
6.0000 mg | Freq: Once | INTRAMUSCULAR | Status: DC
  Filled 2014-03-04: qty 2

## 2014-03-04 MED ORDER — ACETAMINOPHEN 325 MG PO TABS
650.0000 mg | ORAL_TABLET | ORAL | Status: DC | PRN
  Administered 2014-03-05: 650 mg via ORAL
  Filled 2014-03-04: qty 2

## 2014-03-04 MED ORDER — TICAGRELOR 90 MG PO TABS
90.0000 mg | ORAL_TABLET | Freq: Two times a day (BID) | ORAL | Status: DC
  Administered 2014-03-04 – 2014-03-05 (×2): 90 mg via ORAL
  Filled 2014-03-04 (×3): qty 1

## 2014-03-04 MED ORDER — MORPHINE SULFATE 4 MG/ML IJ SOLN
4.0000 mg | Freq: Once | INTRAMUSCULAR | Status: AC
  Administered 2014-03-04: 4 mg via INTRAVENOUS

## 2014-03-04 MED ORDER — GI COCKTAIL ~~LOC~~
30.0000 mL | Freq: Once | ORAL | Status: DC
  Filled 2014-03-04: qty 30

## 2014-03-04 MED ORDER — PRAVASTATIN SODIUM 10 MG PO TABS
10.0000 mg | ORAL_TABLET | Freq: Every day | ORAL | Status: DC
  Administered 2014-03-04 – 2014-03-05 (×2): 10 mg via ORAL
  Filled 2014-03-04 (×3): qty 1

## 2014-03-04 NOTE — ED Notes (Signed)
PA at the bedside.

## 2014-03-04 NOTE — H&P (Signed)
Pt. Seen and examined. Agree with the NP/PA-C note as written. Mr. Mitchell [name] is a 53 year old male who presented with acute onset chest pain this weekend and was noted to have significant inferior ST elevation. He had an occluded right coronary artery and underwent PCI and drug-eluting stent placement. Subsequent to this his symptoms resolved. He was noted to have nonsustained VT overnight and the following day was anxious to leave and left AGAINST MEDICAL ADVICE. He re\re presents today with upper midepigastric/subxiphoid pain which is similar in intensity to his MI but in a different location. He reports his symptoms are worse when lying down and better when sitting up. He also has not had a bowel movement in 5 days, and previously had bowel movements twice daily. He denies any flatulence. He has also had no nausea or vomiting. He denies dysuria. His EKG shows inferior Q waves with anterolateral T wave inversions and is essentially unchanged from his EKG after his MI.  Troponin is elevated however lower than the previous troponin on presentation and could represent continued decline.  Differential diagnosis includes: Unlikely acute coronary syndrome, constipation, gastritis and/or gastric ulcer, GERD, or possibly post MI pericarditis (however it is quite early for this finding and there are no supportive EKG changes).  PLAN: Agree with admission and had a cardiac enzymes. We will heparinize in case this is recurrent acute coronary syndrome however I am fairly certain that it is not. He does have a tender abdomen on exam which is somewhat distended. Bowel sounds were present, but somewhat high-pitched. I suspect he may have significant constipation. We will check an abdominal x-ray, start a PPI and stool softener and try a GI cocktail to see if that resolves his symptoms. I also recommend a limited echocardiogram to evaluate for pericarditis and/or pericardial effusion as well as changes in LV/RV function,  given his presentation with hypotension and bradycardia.  Chrystie NoseKenneth C. Breyonna Nault, MD, [facility]ACC Attending Cardiologist [facility]

## 2014-03-04 NOTE — Progress Notes (Signed)
ANTICOAGULATION CONSULT NOTE - Follow Up Consult  Pharmacy Consult for heparin Indication: chest pain/ACS  Labs:  Recent Labs  03/04/14 1155 03/04/14 1950 03/04/14 2300  HGB 15.8  --   --   HCT 43.6  --   --   PLT 230  --   --   HEPARINUNFRC  --   --  0.35  CREATININE 1.05  --   --   TROPONINI 1.92* 1.63*  --     Assessment/Plan:  53yo male therapeutic on heparin with initial dosing for CP. Will continue gtt at current rate and confirm stable with am labs.   Vernard GamblesVeronda Kylan Veach, PharmD, BCPS  03/04/2014,11:41 PM

## 2014-03-04 NOTE — Discharge Planning (Signed)
[facility] Community Health & Eligibility Specialist  Spoke to the patient regarding primary care resources and the [facility] orange [facility]. Orange card application and instructions given. Patient and family at bedside instructed to contact me once discharged to further go over orange card application. Resource guide and my contact information also provided for any future questions or concerns.

## 2014-03-04 NOTE — ED Notes (Signed)
Pt reports onset last night of sob, dull mid chest pains, and headache. Having back pain but hx of same. Pt had recent stent placed on Saturday and left hospital ama. ekg done at triage, airway intact.

## 2014-03-04 NOTE — ED Provider Notes (Signed)
CSN: [id number]     Arrival date & time 03/04/14  1121 History   First MD Initiated Contact with Patient 03/04/14 1130     Chief Complaint  Patient presents with  . Shortness of Breath  . Chest Pain     (Consider location/radiation/quality/duration/timing/severity/associated sxs/prior Treatment) HPI Comments: Pt comes in today with worsening sob over the last couple of days. Pt left ama after having a stemi and stent placed. Pt states that the sob has gotten worse over the last couple of days. States that he is having generalized left chest pain which is similar to what he had while in the hospital. Sob is constant. Denies fever or cough. Denies extremity swelling. Pt states that he is also having an extremely bad headache and dizziness. Pt states that he feels off balance the room is not spinning. Unable to access visual changes as states that he needs glasses already.state that the xanax help with some of the symptoms  The history is provided by the patient. No language interpreter was used.    History reviewed. No pertinent past medical history. History reviewed. No pertinent past surgical history. History reviewed. No pertinent family history. History  Substance Use Topics  . Smoking status: Never Smoker   . Smokeless tobacco: Not on file  . Alcohol Use: No    Review of Systems  All other systems reviewed and are negative.     Allergies  Review of patient's allergies indicates no known allergies.  Home Medications   Prior to Admission medications   Medication Sig Start Date End Date Taking? Authorizing Provider  ALPRAZolam Prudy Feeler(XANAX) 0.5 MG tablet Take 1 tablet (0.5 mg total) by mouth 2 (two) times daily as needed for anxiety. 03/01/14   Jodelle GrossKathryn M Lawrence, NP  aspirin 81 MG chewable tablet Chew 1 tablet (81 mg total) by mouth daily. 03/01/14   Jodelle GrossKathryn M Lawrence, NP  carvedilol (COREG) 6.25 MG tablet Take 1 tablet (6.25 mg total) by mouth 2 (two) times daily with a meal.  03/01/14   Jodelle GrossKathryn M Lawrence, NP  lisinopril (PRINIVIL,ZESTRIL) 5 MG tablet Take 1 tablet (5 mg total) by mouth daily. 03/01/14   Jodelle GrossKathryn M Lawrence, NP  lovastatin (MEVACOR) 40 MG tablet Take 1 tablet (40 mg total) by mouth at bedtime. 03/01/14   Jodelle GrossKathryn M Lawrence, NP  ticagrelor (BRILINTA) 90 MG TABS tablet Take 1 tablet (90 mg total) by mouth 2 (two) times daily. 03/01/14   Jodelle GrossKathryn M Lawrence, NP  ticagrelor (BRILINTA) 90 MG TABS tablet Take 1 tablet (90 mg total) by mouth 2 (two) times daily. 03/01/14   Jodelle GrossKathryn M Lawrence, NP   BP 106/78  Pulse 64  Temp(Src) 97.6 F (36.4 C) (Oral)  Resp 16  SpO2 100% Physical Exam  Nursing note and vitals reviewed. Constitutional: He is oriented to person, place, and time. He appears well-developed and well-nourished.  HENT:  Head: Normocephalic and atraumatic.  Cardiovascular: Normal rate and regular rhythm.   Pulmonary/Chest: Breath sounds normal.  Pt is sob, but able to speak in full sentences  Abdominal: Soft. Bowel sounds are normal. There is no tenderness.  Musculoskeletal: Normal range of motion.  Neurological: He is alert and oriented to person, place, and time. He exhibits normal muscle tone. Coordination normal.  Skin: Skin is warm and dry.    ED Course  Procedures (including critical care time) Labs Review Labs Reviewed  CBC WITH DIFFERENTIAL - Abnormal; Notable for the following:    MCHC 36.2 (*)  All other components within normal limits  COMPREHENSIVE METABOLIC PANEL - Abnormal; Notable for the following:    Alkaline Phosphatase 122 (*)    GFR calc non Af Amer 79 (*)    Anion gap 17 (*)    All other components within normal limits  TROPONIN I - Abnormal; Notable for the following:    Troponin I 1.92 (*)    All other components within normal limits  PRO B NATRIURETIC PEPTIDE    Imaging Review Ct Head Wo Contrast  03/04/2014   CLINICAL DATA:  Headache and dizziness beginning last night. No known injury. Initial  encounter.  EXAM: CT HEAD WITHOUT CONTRAST  TECHNIQUE: Contiguous axial images were obtained from the base of the skull through the vertex without intravenous contrast.  COMPARISON:  None.  FINDINGS: The brain appears normal without hemorrhage, infarct, mass lesion, mass effect, midline shift or abnormal extra-axial fluid collection. The calvarium is intact. No hydrocephalus or pneumocephalus. There is partial visualization of mild appearing mucosal thickening in the right maxillary sinus.  IMPRESSION: No acute abnormality.   Electronically Signed   By: Drusilla Kannerhomas  Dalessio M.D.   On: 03/04/2014 13:12   Dg Chest Portable 1 View  03/04/2014   CLINICAL DATA:  53 year old male with acute chest pain and shortness of Breath. Initial encounter.  EXAM: PORTABLE CHEST - 1 VIEW  COMPARISON:  02/28/2014.  FINDINGS: Portable AP upright view at 1217 hr. Lung volumes remain normal. Allowing for portable technique, the lungs are clear. Cardiac and mediastinal contours stable and within normal limits for AP technique. No pneumothorax or effusion.  IMPRESSION: Negative, no acute cardiopulmonary abnormality.   Electronically Signed   By: Augusto GambleLee  Hall M.D.   On: 03/04/2014 12:55     Date: 03/04/2014  Rate: 84  Rhythm: normal sinus rhythm  QRS Axis: left  Intervals: normal  ST/T Wave abnormalities: nonspecific T wave changes  Conduction Disutrbances:none  Narrative Interpretation:   Old EKG Reviewed: unchanged   MDM   Final diagnoses:  SOB (shortness of breath)  Chest pain, unspecified chest pain type    Pt to be seen by cardiology for likely admission.    Teressa LowerVrinda Gavina Dildine, NP 03/04/14 1433

## 2014-03-04 NOTE — ED Notes (Signed)
Troponin 1.92 received from lab. EDP aware.

## 2014-03-04 NOTE — ED Notes (Signed)
Patient returned from X-ray 

## 2014-03-04 NOTE — ED Notes (Signed)
Mitchell [name], Mother, [phone]

## 2014-03-04 NOTE — ED Notes (Signed)
Attempted report 

## 2014-03-04 NOTE — ED Notes (Signed)
Pt reporting severe sudden onset epigastric pain and pressure. Pt states that it feels the same as when he had his MI but in a different spot.. EDP aware.

## 2014-03-04 NOTE — Progress Notes (Signed)
I agree with the assessment and plan  Lysle PearlRachel Renly Guedes, PharmD, BCPS Pager # [phone] 03/04/2014 4:25 PM

## 2014-03-04 NOTE — H&P (Signed)
Patient ID: Mitchell [name] MRN: [id number], DOB/AGE: [birth year]   Admit date: 03/04/2014   Primary Physician: No primary provider on file. Primary Cardiologist: Dr. Shirlee LatchMcLean  Pt. Profile:  53 year old Caucasian male with past medical history of hyperlipidemia and recently diagnosed coronary artery disease status post inferior STEMI present with persistent dyspnea  Problem List  Past Medical History  Diagnosis Date  . Hyperlipidemia   . CAD (coronary artery disease)     inferior STEMI 02/28/2014 single v dx prox RCA occlusion treated with DES  . NSVT (nonsustained ventricular tachycardia)     in the setting of inferior STEMI    Past Surgical History  Procedure Laterality Date  . Cardiac catheterization      02/28/2014 prox RCA single vessel occlusion treated with DES     Allergies  No Known Allergies  HPI  The patient is a 53 year old Caucasian male with past medical history of hyperlipidemia and recently diagnosed coronary artery disease status post inferior STEMI. Patient was admitted on 02/28/2014 for interior STEMI, he underwent cardiac catheterization in the morning of 03/01/2014, which showed single-vessel disease with proximal RCA occlusion treated with drug-eluting stent. Per cath report, his EF was 45-50% on LV gram. Prior to cardiac catheterization, he had a hypotensive episodes with sublingual nitroglycerin. Echocardiogram obtained on admission showed EF 55-60%, hypokinesis of the basal mid inferior lateral and inferior myocardium, grade 1 diastolic dysfunction, atrial septal aneurysm, no pericardial effusion. Post cardiac catheterization, he had a 25 beat run of nonsustained VT. Patient was planned to stay overnight for observation, however he eventually decided to leave AMA. He was given carvedilol, lisinopril, lovastatin, aspirin and Brilinta.  Since his discharge, he had some chest soreness after MI worse with palpation. He also noted a constant dyspnea and  dizziness since his cardiac catheterization. He is not sure what causing this symptom however he states the symptom is constant and is not related to exertion. He has also been constipated, he has not had any bowel movement since Sunday 03/01/2014. During the mean time, he states he has been compliant with his cardiac medications since discharge. He decided [facility]. While in the ED, patient had one episode of mid substernal/epigastric pain. Better sitting up, worse when laying down. He denies any significant nausea or vomiting, lower extremity edema orthopnea or paroxysmal nocturnal dyspnea. He has no recent fever or chill, however does have sore throat started yesterday. EKG was obtained in the ED which showed T-wave inversions in inferior lead and T wave inversion in V5 and V6 is unchanged since the EKG from his prior admission, however does have more poor R wave progression in anterior lead. Cardiology was consulted for chest/epigastric pain. Of note, his troponin is now 1.9 decreased from over 6 from 4 days ago. His chest x-ray shows no sign of heart failure and no acute pulmonary abnormalities. CT of the head was obtained in the ED which did not reveal any his acute abnormality.  Home Medications  Prior to Admission medications   Medication Sig Start Date End Date Taking? Authorizing Provider  ALPRAZolam Prudy Feeler(XANAX) 0.5 MG tablet Take 1 tablet (0.5 mg total) by mouth 2 (two) times daily as needed for anxiety. 03/01/14  Yes Jodelle GrossKathryn M Lawrence, NP  aspirin 81 MG chewable tablet Chew 1 tablet (81 mg total) by mouth daily. 03/01/14  Yes Jodelle GrossKathryn M Lawrence, NP  carvedilol (COREG) 6.25 MG tablet Take 1 tablet (6.25 mg total) by mouth  2 (two) times daily with a meal. 03/01/14  Yes Jodelle Gross, NP  lisinopril (PRINIVIL,ZESTRIL) 5 MG tablet Take 1 tablet (5 mg total) by mouth daily. 03/01/14  Yes Jodelle Gross, NP  lovastatin (MEVACOR) 40 MG tablet  Take 1 tablet (40 mg total) by mouth at bedtime. 03/01/14  Yes Jodelle Gross, NP  ticagrelor (BRILINTA) 90 MG TABS tablet Take 1 tablet (90 mg total) by mouth 2 (two) times daily. 03/01/14  Yes Jodelle Gross, NP    Family History  Family History  Problem Relation Age of Onset  . Heart attack Brother 51  . Hypertension Father     Social History  History   Social History  . Marital Status: Legally Separated    Spouse Name: N/A    Number of Children: N/A  . Years of Education: N/A   Occupational History  . Not on file.   Social History Main Topics  . Smoking status: Never Smoker   . Smokeless tobacco: Not on file  . Alcohol Use: No  . Drug Use: Not on file  . Sexual Activity: Not on file   Other Topics Concern  . Not on file   Social History Narrative  . No narrative on file     Review of Systems General:  No chills, fever, night sweats or weight changes.  Cardiovascular:  No dyspnea on exertion, edema, orthopnea, palpitations, paroxysmal nocturnal dyspnea. + chest pain Dermatological: No rash, lesions/masses Respiratory: +cough x 1 day, dyspnea since last MI Urologic: No hematuria, dysuria Abdominal:   No nausea, vomiting, diarrhea, bright red blood per rectum, melena, or hematemesis Neurologic:  No visual changes, wkns, changes in mental status. All other systems reviewed and are otherwise negative except as noted above.  Physical Exam  Blood pressure 110/78, pulse 56, temperature 97.6 F (36.4 C), temperature source Oral, resp. rate 16, SpO2 100.00%.  General: Pleasant, NAD Psych: Normal affect. Neuro: Alert and oriented X 3. Moves all extremities spontaneously. HEENT: Normal  Neck: Supple without bruits or JVD. Lungs:  Resp regular and unlabored, CTA. Heart: RRR no s3, s4, or murmurs. Abdomen: Soft, non-tender, non-distended, BS + x 4.  Extremities: No clubbing, cyanosis or edema. DP/PT/Radials 2+ and equal bilaterally.  Labs  Troponin  [facility]) No results found for this basename: TROPIPOC,  in the last 72 hours  Recent Labs  03/04/14 1155  TROPONINI 1.92*   Lab Results  Component Value Date   WBC 7.3 03/04/2014   HGB 15.8 03/04/2014   HCT 43.6 03/04/2014   MCV 91.6 03/04/2014   PLT 230 03/04/2014     Recent Labs Lab 03/04/14 1155  NA 138  K 4.2  CL 99  CO2 22  BUN 17  CREATININE 1.05  CALCIUM 9.6  PROT 8.1  BILITOT 0.8  ALKPHOS 122*  ALT 16  AST 22  GLUCOSE 82   Lab Results  Component Value Date   CHOL 163 02/28/2014   HDL 54 02/28/2014   LDLCALC 94 02/28/2014   TRIG 73 02/28/2014   No results found for this basename: DDIMER     Radiology/Studies  Ct Head Wo Contrast  03/04/2014   CLINICAL DATA:  Headache and dizziness beginning last night. No known injury. Initial encounter.  EXAM: CT HEAD WITHOUT CONTRAST  TECHNIQUE: Contiguous axial images were obtained from the base of the skull through the vertex without intravenous contrast.  COMPARISON:  None.  FINDINGS: The brain appears normal without hemorrhage, infarct,  mass lesion, mass effect, midline shift or abnormal extra-axial fluid collection. The calvarium is intact. No hydrocephalus or pneumocephalus. There is partial visualization of mild appearing mucosal thickening in the right maxillary sinus.  IMPRESSION: No acute abnormality.   Electronically Signed   By: Drusilla Kannerhomas  Dalessio M.D.   On: 03/04/2014 13:12   Dg Chest Portable 1 View  03/04/2014   CLINICAL DATA:  53 year old male with acute chest pain and shortness of Breath. Initial encounter.  EXAM: PORTABLE CHEST - 1 VIEW  COMPARISON:  02/28/2014.  FINDINGS: Portable AP upright view at 1217 hr. Lung volumes remain normal. Allowing for portable technique, the lungs are clear. Cardiac and mediastinal contours stable and within normal limits for AP technique. No pneumothorax or effusion.  IMPRESSION: Negative, no acute cardiopulmonary abnormality.   Electronically Signed   By: Augusto GambleLee   Hall M.D.   On: 03/04/2014 12:55   Dg Chest Portable 1 View  02/28/2014   CLINICAL DATA:  Code ST elevation myocardial infarction. Initial encounter.  EXAM: PORTABLE CHEST - 1 VIEW  COMPARISON:  None.  FINDINGS: The lungs are well-aerated and clear. There is no evidence of focal opacification, pleural effusion or pneumothorax.  The cardiomediastinal silhouette is borderline normal in size. No acute osseous abnormalities are seen.  IMPRESSION: No acute cardiopulmonary process seen.   Electronically Signed   By: Roanna RaiderJeffery  Chang M.D.   On: 02/28/2014 05:44    ECG   EKG was obtained in the ED which showed T-wave inversions in inferior leads and T wave inversion in V5 and V6 is unchanged since the EKG from his prior admission, however does have more poor R wave progression in anterior lead  Echocardiogram 10/10/20o15  - Left ventricle: The cavity size was normal. Wall thickness was normal. Systolic function was normal. The estimated ejection fraction was in the range of 55% to 60%. There is hypokinesis of the basal-midinferolateral and inferior myocardium. Doppler parameters are consistent with abnormal left ventricular relaxation (grade 1 diastolic dysfunction). - Aortic valve: Mildly calcified annulus. Trileaflet. There was no significant regurgitation. - Left atrium: The atrium was at the upper limits of normal in size. - Right atrium: Central venous pressure (est): 3 mm Hg. 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 No defect or patent foramen ovale was identified. - Tricuspid valve: There was trivial regurgitation. - Pulmonary arteries: Systolic pressure could not be accurately estimated. - Pericardium, extracardiac: There was no pericardial effusion.  Impressions:  - Normal wall thickness with LVEF 55-60%, mid to basal inferolateral hypokinesis, grade 1 diastolic dysfunction. Upper normal left atrial size. The interatrial  septum was hypermobile and suggestive of atrial septal aneurysm. Unable to assess PASP.      ASSESSMENT AND PLAN  1. Chest pain/Epigastric pain  - location is different from prior MI, admit to tele, trend trop  - may be related to constipation, will give stool softener and GI cocktail  - avoid nitrate with recent inferior MI, esp since he had hypotension with nitrate during last admission  - low suspicion for instent rethrombosis, will get limited echo to check wall motion, RV function and pericardial effusion (unlikely to be dressler's only 3 days after MI, however CP does improve with leaning forward and sitting up)  2. Dyspnea: maybe related to constipation, if does not improve, may change Brilinta to Effient to see if has SE with Brilinta  - no significant HF sign on exam, however suffered major inferior MI, check RV  function on echo to make sure no RV failure  3. Constipation: will get abdominal X ray, stool softener 4. Hyperlipidemia 5. Anxiety 6. H/o NSVT after MI  Signed, Azalee Course, Cordelia Poche 03/04/2014, 3:45 PM

## 2014-03-04 NOTE — Progress Notes (Signed)
ANTICOAGULATION CONSULT NOTE - Initial Consult  Pharmacy Consult for heparin Indication: ACS  No Known Allergies  Patient Measurements:   Heparin Dosing Weight: 93.2 kg  Vital Signs: Temp: 97.6 F (36.4 C) (10/14 1135) Temp Source: Oral (10/14 1135) BP: 110/78 mmHg (10/14 1538) Pulse Rate: 56 (10/14 1538)  Labs:  Recent Labs  03/04/14 1155  HGB 15.8  HCT 43.6  PLT 230  CREATININE 1.05  TROPONINI 1.92*    The CrCl is unknown because both a height and weight (above a minimum accepted value) are required for this calculation.   Medical History: Past Medical History  Diagnosis Date  . Hyperlipidemia   . CAD (coronary artery disease)     inferior STEMI 02/28/2014 single v dx prox RCA occlusion treated with DES  . NSVT (nonsustained ventricular tachycardia)     in the setting of inferior STEMI    Medications:  No PTA anticoagulation  Assessment: 53 y/o M w/ SOB, chest pain, HA, back pain.  Pt had STEMI and stent placed on Saturday and left hospital AMA. CXR shows no acute cardiopulmonary abnormality.  POC troponin 1.92, pulse 56, BP 110/78, afebrile.  CrCl 96.8 mL/min.  CBC, Hgb, Hct WNL.  Goal of Therapy:  Goal heparin level 0.3 - 0.7 Monitor antiplatelets by anticoagulation protocol: yes  Plan:  Heparin bolus IV 4000u x 1, then heparin 1200 units/hr F/u CBC, 6 hour heparin level, daily heparin level  Mitchell [name], Mitchell [name] 03/04/2014,4:12 PM

## 2014-03-04 NOTE — ED Notes (Signed)
Pt given ginger ale, sitting up at bedside.

## 2014-03-05 DIAGNOSIS — I2510 Atherosclerotic heart disease of native coronary artery without angina pectoris: Secondary | ICD-10-CM

## 2014-03-05 DIAGNOSIS — R1010 Upper abdominal pain, unspecified: Secondary | ICD-10-CM

## 2014-03-05 DIAGNOSIS — R0602 Shortness of breath: Secondary | ICD-10-CM

## 2014-03-05 LAB — BASIC METABOLIC PANEL
Anion gap: 14 (ref 5–15)
BUN: 21 mg/dL (ref 6–23)
CHLORIDE: 102 meq/L (ref 96–112)
CO2: 22 meq/L (ref 19–32)
CREATININE: 1.07 mg/dL (ref 0.50–1.35)
Calcium: 8.9 mg/dL (ref 8.4–10.5)
GFR calc Af Amer: 90 mL/min — ABNORMAL LOW (ref >90)
GFR calc non Af Amer: 77 mL/min — ABNORMAL LOW (ref >90)
GLUCOSE: 92 mg/dL (ref 70–99)
Potassium: 4.2 mEq/L (ref 3.7–5.3)
Sodium: 138 mEq/L (ref 137–147)

## 2014-03-05 LAB — CBC
HCT: 38.2 % — ABNORMAL LOW (ref 39.0–52.0)
Hemoglobin: 13.5 g/dL (ref 13.0–17.0)
MCH: 32.4 pg (ref 26.0–34.0)
MCHC: 35.3 g/dL (ref 30.0–36.0)
MCV: 91.6 fL (ref 78.0–100.0)
Platelets: 204 10*3/uL (ref 150–400)
RBC: 4.17 MIL/uL — ABNORMAL LOW (ref 4.22–5.81)
RDW: 11.9 % (ref 11.5–15.5)
WBC: 6.6 10*3/uL (ref 4.0–10.5)

## 2014-03-05 LAB — LIPID PANEL
CHOL/HDL RATIO: 3.8 ratio
Cholesterol: 140 mg/dL (ref 0–200)
HDL: 37 mg/dL — AB (ref >39)
LDL CALC: 83 mg/dL (ref 0–99)
TRIGLYCERIDES: 102 mg/dL (ref <150)
VLDL: 20 mg/dL (ref 0–40)

## 2014-03-05 LAB — HEPARIN LEVEL (UNFRACTIONATED)
HEPARIN UNFRACTIONATED: 0.26 [IU]/mL — AB (ref 0.30–0.70)
HEPARIN UNFRACTIONATED: 0.53 [IU]/mL (ref 0.30–0.70)

## 2014-03-05 LAB — TROPONIN I
TROPONIN I: 1.73 ng/mL — AB (ref <0.30)
Troponin I: 1.38 ng/mL (ref <0.30)

## 2014-03-05 MED ORDER — MORPHINE SULFATE 2 MG/ML IJ SOLN
1.0000 mg | INTRAMUSCULAR | Status: DC | PRN
  Administered 2014-03-05: 1 mg via INTRAVENOUS

## 2014-03-05 MED ORDER — PRASUGREL HCL 10 MG PO TABS
10.0000 mg | ORAL_TABLET | Freq: Every day | ORAL | Status: DC
  Administered 2014-03-05: 10 mg via ORAL
  Filled 2014-03-05 (×2): qty 1

## 2014-03-05 MED ORDER — PRASUGREL HCL 10 MG PO TABS
10.0000 mg | ORAL_TABLET | Freq: Every day | ORAL | Status: DC
  Filled 2014-03-05: qty 1

## 2014-03-05 MED ORDER — MORPHINE SULFATE 2 MG/ML IJ SOLN
INTRAMUSCULAR | Status: AC
  Filled 2014-03-05: qty 1

## 2014-03-05 MED ORDER — SENNOSIDES-DOCUSATE SODIUM 8.6-50 MG PO TABS
1.0000 | ORAL_TABLET | Freq: Two times a day (BID) | ORAL | Status: DC
  Administered 2014-03-05 – 2014-03-06 (×3): 1 via ORAL
  Filled 2014-03-05 (×4): qty 1

## 2014-03-05 NOTE — Progress Notes (Signed)
ANTICOAGULATION CONSULT NOTE - Follow Up Consult  Pharmacy Consult for heparin Indication: ACS  No Known Allergies  Patient Measurements: Height: 5' 10.87" (180 cm) Weight: 213 lb 6.5 oz (96.8 kg) IBW/kg (Calculated) : 74.99 Heparin Dosing Weight: 93.2 kg  Vital Signs: Temp: 97.8 F (36.6 C) (10/15 1345) Temp Source: Oral (10/15 1345) BP: 110/62 mmHg (10/15 1345) Pulse Rate: 68 (10/15 1345)  Labs:  Recent Labs  03/04/14 1155 03/04/14 1950 03/04/14 2300 03/05/14 0420 03/05/14 0612 03/05/14 1200  HGB 15.8  --   --  13.5  --   --   HCT 43.6  --   --  38.2*  --   --   PLT 230  --   --  204  --   --   HEPARINUNFRC  --   --  0.35 0.26*  --  0.53  CREATININE 1.05  --   --  1.07  --   --   TROPONINI 1.92* 1.63* 1.73*  --  1.38*  --     Estimated Creatinine Clearance: 94.5 ml/min (by C-G formula based on Cr of 1.07).   Medical History: Past Medical History  Diagnosis Date  . Hyperlipidemia   . CAD (coronary artery disease)     inferior STEMI 02/28/2014 single v dx prox RCA occlusion treated with DES  . NSVT (nonsustained ventricular tachycardia)     in the setting of inferior STEMI      Assessment: 53 y/o M w/ SOB, chest pain, HA, back pain.  Pt had STEMI and stent placed on Saturday 02/28/14 and left hospital AMA. CXR shows no acute cardiopulmonary abnormality. No ECG changes, Tp trending down from initial event last week, VSS.  CP improved today.  Cr 1, K > 4,  CBC, Hgb, Hct WNL.  Heparin drip 1400 uts/hr HL 0.53 at goal, no bleeding   Goal of Therapy:  Goal heparin level 0.3 - 0.7 Monitor antiplatelets by anticoagulation protocol: yes  Plan:  Continue heparin drip 1400 uts/hr HL, CBC daily  Leota SauersLisa Padraic Marinos Pharm.D. CPP, BCPS Clinical Pharmacist [phone] 03/05/2014 2:58 PM

## 2014-03-05 NOTE — Progress Notes (Signed)
Patient ID: Mitchell [name], male   DOB: May 26, 1960, 53 y.o.   MRN: [id number]    Subjective:  No pain still a little light headed and abdomen a bit better with small BM   Objective:  Filed Vitals:   03/04/14 1744 03/04/14 1822 03/04/14 2000 03/05/14 0451  BP: 105/71 109/75 136/88 108/58  Pulse: 80 69 63 61  Temp:  97.8 F (36.6 C) 97.9 F (36.6 C) 98.3 F (36.8 C)  TempSrc:  Oral Oral Oral  Resp: 19 20 19 20   Height:      Weight:    213 lb 6.5 oz (96.8 kg)  SpO2: 97% 99% 97% 98%    Intake/Output from previous day: No intake or output data in the 24 hours ending 03/05/14 1007  Physical Exam: .Affect appropriate Healthy:  appears stated age HEENT: normal Neck supple with no adenopathy JVP normal no bruits no thyromegaly Lungs clear with no wheezing and good diaphragmatic motion Heart:  S1/S2 no murmur, no rub, gallop or click PMI normal Abdomen: benighn, BS positve, no tenderness, no AAA no bruit.  No HSM or HJR Distal pulses intact with no bruits No edema Neuro non-focal Skin warm and dry No muscular weakness   Lab Results: Basic Metabolic Panel:  Recent Labs  11/91/4710/14/15 1155 03/05/14 0420  NA 138 138  K 4.2 4.2  CL 99 102  CO2 22 22  GLUCOSE 82 92  BUN 17 21  CREATININE 1.05 1.07  CALCIUM 9.6 8.9   Liver Function Tests:  Recent Labs  03/04/14 1155  AST 22  ALT 16  ALKPHOS 122*  BILITOT 0.8  PROT 8.1  ALBUMIN 4.1   CBC:  Recent Labs  03/04/14 1155 03/05/14 0420  WBC 7.3 6.6  NEUTROABS 5.0  --   HGB 15.8 13.5  HCT 43.6 38.2*  MCV 91.6 91.6  PLT 230 204   Cardiac Enzymes:  Recent Labs  03/04/14 1950 03/04/14 2300 03/05/14 0612  TROPONINI 1.63* 1.73* 1.38*   Fasting Lipid Panel:  Recent Labs  03/05/14 0420  CHOL 140  HDL 37*  LDLCALC 83  TRIG [id number]  CHOLHDL 3.8    Imaging: Dg Abd 1 View  03/04/2014   CLINICAL DATA:  Heart catheterization 02/28/2014. Epigastric pain. Abdominal pain.  EXAM: ABDOMEN - 1 VIEW   COMPARISON:  None.  FINDINGS: There is no bowel dilatation to suggest obstruction. There is no evidence of pneumoperitoneum, portal venous gas or pneumatosis. There is a 11 mm calcification projecting over the lower pole of the left kidney concerning for nephrolithiasis. There are no pathologic calcifications along the expected course of the ureters.The osseous structures are unremarkable.  IMPRESSION: 1. Left nephrolithiasis.   Electronically Signed   By: Elige KoHetal  Patel   On: 03/04/2014 19:30   Ct Head Wo Contrast  03/04/2014   CLINICAL DATA:  Headache and dizziness beginning last night. No known injury. Initial encounter.  EXAM: CT HEAD WITHOUT CONTRAST  TECHNIQUE: Contiguous axial images were obtained from the base of the skull through the vertex without intravenous contrast.  COMPARISON:  None.  FINDINGS: The brain appears normal without hemorrhage, infarct, mass lesion, mass effect, midline shift or abnormal extra-axial fluid collection. The calvarium is intact. No hydrocephalus or pneumocephalus. There is partial visualization of mild appearing mucosal thickening in the right maxillary sinus.  IMPRESSION: No acute abnormality.   Electronically Signed   By: Drusilla Kannerhomas  Dalessio M.D.   On: 03/04/2014 13:12   Dg Chest Portable 1 View  03/04/2014  CLINICAL DATA:  53 year old male with acute chest pain and shortness of Breath. Initial encounter.  EXAM: PORTABLE CHEST - 1 VIEW  COMPARISON:  02/28/2014.  FINDINGS: Portable AP upright view at 1217 hr. Lung volumes remain normal. Allowing for portable technique, the lungs are clear. Cardiac and mediastinal contours stable and within normal limits for AP technique. No pneumothorax or effusion.  IMPRESSION: Negative, no acute cardiopulmonary abnormality.   Electronically Signed   By: Augusto GambleLee  Hall M.D.   On: 03/04/2014 12:55    Cardiac Studies:  ECG:   SR IMI no acute ST elevation    Telemetry:   NSR no VT  Echo:  EF 55-60%  No pericardial effusion    Medications:   . aspirin  81 mg Oral Daily  . carvedilol  3.125 mg Oral BID WC  . docusate sodium  100 mg Oral Daily  . gi cocktail  30 mL Oral Once  . lisinopril  5 mg Oral Daily  . morphine      . pantoprazole  40 mg Oral Daily  . pravastatin  10 mg Oral q1800  . ticagrelor  90 mg Oral BID     . heparin 1,400 Units/hr (03/05/14 0959)    Assessment/Plan:  Chest Pain:  Atypical  No acute ECG changes  Troponin coming down from initial event 5 days ago.  No indication for repeat cath Dyspnea:  Likely from Brillinta change to Effient No signs CHF EF normal no effusion Constipation:  Improved continue stool softener KUB ok    Charlton HawsPeter Willard Farquharson 03/05/2014, 10:07 AM

## 2014-03-05 NOTE — Progress Notes (Signed)
ANTICOAGULATION CONSULT NOTE - Follow Up Consult  Pharmacy Consult for Heparin  Indication: chest pain/ACS  No Known Allergies  Patient Measurements: Height: 5' 10.87" (180 cm) Weight: 213 lb 6.5 oz (96.8 kg) IBW/kg (Calculated) : 74.99  Vital Signs: Temp: 98.3 F (36.8 C) (10/15 0451) Temp Source: Oral (10/15 0451) BP: 108/58 mmHg (10/15 0451) Pulse Rate: 61 (10/15 0451)  Labs:  Recent Labs  03/04/14 1155 03/04/14 1950 03/04/14 2300 03/05/14 0420  HGB 15.8  --   --  13.5  HCT 43.6  --   --  38.2*  PLT 230  --   --  204  HEPARINUNFRC  --   --  0.35 0.26*  CREATININE 1.05  --   --   --   TROPONINI 1.92* 1.63* 1.73*  --     Estimated Creatinine Clearance: 96.3 ml/min (by C-G formula based on Cr of 1.05).   Assessment: Sub-therapeutic heparin level, other labs as above, no issues per RN.   Goal of Therapy:  Heparin level 0.3-0.7 units/ml Monitor platelets by anticoagulation protocol: Yes   Plan:  -Increase heparin to 1400 units/hr -1300 HL -Daily CBC/HL -Monitor for bleeding  Abran DukeLedford, Ople Girgis 03/05/2014,5:44 AM

## 2014-03-05 NOTE — ED Provider Notes (Signed)
Medical screening examination/treatment/procedure(s) were performed by non-physician practitioner and as supervising physician I was immediately available for consultation/collaboration.   EKG Interpretation None        Shon Batonourtney F Demetrius Barrell, MD 03/05/14 1736

## 2014-03-05 NOTE — Care Management Note (Signed)
    Page 1 of 1   03/05/2014     12:21:13 PM CARE MANAGEMENT NOTE 03/05/2014  Patient:  Mitchell [name],Mitchell [name]   Account Number:  [id number]  Date Initiated:  03/05/2014  Documentation initiated by:  [facility]  Subjective/Objective Assessment:   adm: presented with acute onset chest pain     Action/Plan:   discharge planning   Anticipated DC Date:  03/06/2014   Anticipated DC Plan:  [place]  CM consult      Choice offered to / List presented to:             Status of service:  Completed, signed off Medicare Important Message given?   (If response is "NO", the following Medicare IM given date fields will be blank) Date Medicare IM given:   Medicare IM given by:   Date Additional Medicare IM given:   Additional Medicare IM given by:    Discharge Disposition:  HOME/SELF CARE  Per UR Regulation:    If discussed at Long Length of Stay Meetings, dates discussed:    Comments:  03/05/14 11:00 CM met with pt and pt's mother (caretaker) and gave pt free 30 day trial Effient card.  Pt's mother states they have an appointment with the [facility] on March 11, 2014 at 3:00pm and are motivated to keep it.  Pt was recently discharge from [facility] this past weekend and came back to the ED with SOB.  RN states the change in medication (to Effient) may prevent SOB symptoms.  No other CM needs were communicated.  Mariane Masters, BSN, CM [phone].

## 2014-03-05 NOTE — Progress Notes (Signed)
UR completed 

## 2014-03-06 ENCOUNTER — Other Ambulatory Visit: Payer: Self-pay

## 2014-03-06 ENCOUNTER — Telehealth: Payer: Self-pay | Admitting: Nurse Practitioner

## 2014-03-06 ENCOUNTER — Encounter (HOSPITAL_COMMUNITY): Payer: Self-pay | Admitting: Nurse Practitioner

## 2014-03-06 DIAGNOSIS — R0789 Other chest pain: Secondary | ICD-10-CM

## 2014-03-06 DIAGNOSIS — K5900 Constipation, unspecified: Secondary | ICD-10-CM

## 2014-03-06 DIAGNOSIS — I2119 ST elevation (STEMI) myocardial infarction involving other coronary artery of inferior wall: Secondary | ICD-10-CM

## 2014-03-06 HISTORY — DX: Constipation, unspecified: K59.00

## 2014-03-06 LAB — CBC
HCT: 37.3 % — ABNORMAL LOW (ref 39.0–52.0)
HEMOGLOBIN: 13.3 g/dL (ref 13.0–17.0)
MCH: 32.8 pg (ref 26.0–34.0)
MCHC: 35.7 g/dL (ref 30.0–36.0)
MCV: 92.1 fL (ref 78.0–100.0)
Platelets: 205 10*3/uL (ref 150–400)
RBC: 4.05 MIL/uL — ABNORMAL LOW (ref 4.22–5.81)
RDW: 12.1 % (ref 11.5–15.5)
WBC: 7.1 10*3/uL (ref 4.0–10.5)

## 2014-03-06 LAB — HEPARIN LEVEL (UNFRACTIONATED): HEPARIN UNFRACTIONATED: 0.54 [IU]/mL (ref 0.30–0.70)

## 2014-03-06 MED ORDER — NITROGLYCERIN 0.4 MG SL SUBL: 0.4000 mg | SUBLINGUAL_TABLET | SUBLINGUAL | Status: DC | PRN

## 2014-03-06 MED ORDER — PRASUGREL HCL 10 MG PO TABS: 10.0000 mg | ORAL_TABLET | Freq: Every day | ORAL | Status: DC

## 2014-03-06 NOTE — Telephone Encounter (Signed)
New message     TCM appt on 03-12-14 per Thayer Ohmhris.

## 2014-03-06 NOTE — Discharge Instructions (Signed)
**  PLEASE REMEMBER TO BRING ALL OF YOUR MEDICATIONS TO EACH OF YOUR FOLLOW-UP OFFICE VISITS. ° °NO HEAVY LIFTING X 4 WEEKS. °NO SEXUAL ACTIVITY X 4 WEEKS. °NO DRIVING X 2 WEEKS. °NO SOAKING BATHS, HOT TUBS, POOLS, ETC., X 7 DAYS. ° ° °

## 2014-03-06 NOTE — Progress Notes (Signed)
Patient ID: Mitchell [name], male   DOB: [birth year], 53 y.o.   MRN: [id number]    Subjective:  No pain or dyspnea  Objective:  Filed Vitals:   03/05/14 0451 03/05/14 1345 03/05/14 2036 03/06/14 0611  BP: 108/58 110/62 119/75 105/68  Pulse: 61 68 65 58  Temp: 98.3 F (36.8 C) 97.8 F (36.6 C) 98.4 F (36.9 C) 97.7 F (36.5 C)  TempSrc: Oral Oral Oral Oral  Resp: 20 20 18 18   Height:      Weight: 213 lb 6.5 oz (96.8 kg)     SpO2: 98% 99% 98% 98%    Intake/Output from previous day:  Intake/Output Summary (Last 24 hours) at 03/06/14 0826 Last data filed at 03/05/14 1700  Gross per 24 hour  Intake    480 ml  Output      0 ml  Net    480 ml    Physical Exam: .Affect appropriate Healthy:  appears stated age HEENT: normal Neck supple with no adenopathy JVP normal no bruits no thyromegaly Lungs clear with no wheezing and good diaphragmatic motion Heart:  S1/S2 no murmur, no rub, gallop or click PMI normal Abdomen: benighn, BS positve, no tenderness, no AAA no bruit.  No HSM or HJR Distal pulses intact with no bruits No edema Neuro non-focal Skin warm and dry No muscular weakness   Lab Results: Basic Metabolic Panel:  Recent Labs  40/98/1110/14/15 1155 03/05/14 0420  NA 138 138  K 4.2 4.2  CL 99 102  CO2 22 22  GLUCOSE 82 92  BUN 17 21  CREATININE 1.05 1.07  CALCIUM 9.6 8.9   Liver Function Tests:  Recent Labs  03/04/14 1155  AST 22  ALT 16  ALKPHOS 122*  BILITOT 0.8  PROT 8.1  ALBUMIN 4.1   CBC:  Recent Labs  03/04/14 1155 03/05/14 0420 03/06/14 0305  WBC 7.3 6.6 7.1  NEUTROABS 5.0  --   --   HGB 15.8 13.5 13.3  HCT 43.6 38.2* 37.3*  MCV 91.6 91.6 92.1  PLT 230 204 205   Cardiac Enzymes:  Recent Labs  03/04/14 1950 03/04/14 2300 03/05/14 0612  TROPONINI 1.63* 1.73* 1.38*   Fasting Lipid Panel:  Recent Labs  03/05/14 0420  CHOL 140  HDL 37*  LDLCALC 83  TRIG [id number]  CHOLHDL 3.8    Imaging: Dg Abd 1 View  03/04/2014    CLINICAL DATA:  Heart catheterization 02/28/2014. Epigastric pain. Abdominal pain.  EXAM: ABDOMEN - 1 VIEW  COMPARISON:  None.  FINDINGS: There is no bowel dilatation to suggest obstruction. There is no evidence of pneumoperitoneum, portal venous gas or pneumatosis. There is a 11 mm calcification projecting over the lower pole of the left kidney concerning for nephrolithiasis. There are no pathologic calcifications along the expected course of the ureters.The osseous structures are unremarkable.  IMPRESSION: 1. Left nephrolithiasis.   Electronically Signed   By: Elige KoHetal  Patel   On: 03/04/2014 19:30   Ct Head Wo Contrast  03/04/2014   CLINICAL DATA:  Headache and dizziness beginning last night. No known injury. Initial encounter.  EXAM: CT HEAD WITHOUT CONTRAST  TECHNIQUE: Contiguous axial images were obtained from the base of the skull through the vertex without intravenous contrast.  COMPARISON:  None.  FINDINGS: The brain appears normal without hemorrhage, infarct, mass lesion, mass effect, midline shift or abnormal extra-axial fluid collection. The calvarium is intact. No hydrocephalus or pneumocephalus. There is partial visualization of mild appearing mucosal thickening in the  right maxillary sinus.  IMPRESSION: No acute abnormality.   Electronically Signed   By: Drusilla Kannerhomas  Dalessio M.D.   On: 03/04/2014 13:12   Dg Chest Portable 1 View  03/04/2014   CLINICAL DATA:  53 year old male with acute chest pain and shortness of Breath. Initial encounter.  EXAM: PORTABLE CHEST - 1 VIEW  COMPARISON:  02/28/2014.  FINDINGS: Portable AP upright view at 1217 hr. Lung volumes remain normal. Allowing for portable technique, the lungs are clear. Cardiac and mediastinal contours stable and within normal limits for AP technique. No pneumothorax or effusion.  IMPRESSION: Negative, no acute cardiopulmonary abnormality.   Electronically Signed   By: Augusto GambleLee  Hall M.D.   On: 03/04/2014 12:55    Cardiac Studies:  ECG:   SR IMI no  acute ST elevation    Telemetry:   NSR no VT  Echo:  EF 55-60%  No pericardial effusion   Medications:   . aspirin  81 mg Oral Daily  . carvedilol  3.125 mg Oral BID WC  . gi cocktail  30 mL Oral Once  . lisinopril  5 mg Oral Daily  . pantoprazole  40 mg Oral Daily  . prasugrel  10 mg Oral QHS  . pravastatin  10 mg Oral q1800  . senna-docusate  1 tablet Oral BID     . heparin 1,400 Units/hr (03/06/14 0457)    Assessment/Plan:  Chest Pain:  Atypical  No acute ECG changes  Troponin coming down from initial event 5 days ago.  No indication for repeat cath Dyspnea:  Likely from Brillinta changed  to Effient No signs CHF EF normal no effusion and normal CXR Constipation:  Improved  KUB ok  D/C home only med change will be Effient   Mitchell [name] 03/06/2014, 8:26 AM

## 2014-03-06 NOTE — Progress Notes (Signed)
Pt discharge home. Verbal discharge order given by Ward Givenshris Berge, NP. Pt was given prescriptions and effient card. Discharge instructions reviewed.

## 2014-03-06 NOTE — Telephone Encounter (Signed)
Patient contacted regarding discharge from [facility] on 03/06/2014.  Patient understands to follow up with provider Ward Givenshris Berge on 10/22 at [facility] am [facility]. Patient understands discharge instructions? yes Patient understands medications and regiment? yes  Patient understands to bring all medications to this visit? yes  Spoke with pt - he reports having chest congestion and obvious nasal congestion.  Denies any SOB or CP.  He will call back if any questions or needs.

## 2014-03-06 NOTE — Discharge Summary (Signed)
Discharge Summary   Patient ID: Mitchell LooseDewayne [name],  MRN: [id number], DOB/AGE: 29-Jun-1960 53 y.o.  Admit date: 03/04/2014 Discharge date: 03/06/2014  Primary Care Provider: No primary provider on file. Primary Cardiologist: Golden Circle. McLean, MD   Discharge Diagnoses Principal Problem:   Dyspnea  **Brilinta d/c'd and placed on effient.  Active Problems:   CAD (coronary artery disease), native coronary artery   ST elevation myocardial infarction (STEMI) of inferior wall, subsequent episode of care  **Inf STEMI 02/28/2014 with DES to the RCA @ that time.   Constipation   Midsternal chest pain   Hyperlipidemia   Anxiety   Left Nephrolithiasis noted on abd u/s 03/04/2014  Allergies No Known Allergies  Procedures  KUB 10.14.2015  FINDINGS:  There is no bowel dilatation to suggest obstruction. There is no  evidence of pneumoperitoneum, portal venous gas or pneumatosis.  There is a 11 mm calcification projecting over the lower pole of the  left kidney concerning for nephrolithiasis. There are no pathologic  calcifications along the expected course of the ureters.The osseous  structures are unremarkable. _____________   History of Present Illness  53 y/o male with the above problem list.  He was admitted to [facility] on 02/28/2014 with acute inferior STEMI and underwent cath revealing an occluded RCA, which was successfully treated with a DES.  EF was 55-60% by echo.  Post-cath, he was placed on ASA/Brilinta and subsequently left AMA.  Since leaving the hospital, he experienced persistent dyspnea and dizziness along with chest soreness and constipation.  He presented back [facility] ED on 10/14, where ECG was not significantly changed from his prior admission.  His troponin was elevated but lower than d/c troponin.  He was admitted for further evaluation.  Hospital Course  Troponin continued to trend down and was felt to be elevated secondary to prior event as opposed to recurrent  ischemia/infarct.  Because of persistent subjective dyspnea, brilinta therapy was discontinued and effient was started.  With this switch, dyspnea improved.  Pt complained of constipation throughout hospitalization.  KUB showed only left nephrolithiasis.  He was treated with stool softeners and did have a small BM with some relief.  He will be discharged home today in good condition and we have arranged for f/u next week in the office.  Discharge Vitals Blood pressure 105/68, pulse 58, temperature 97.7 F (36.5 C), temperature source Oral, resp. rate 18, height 5' 10.87" (1.8 m), weight 213 lb 6.5 oz (96.8 kg), SpO2 98.00%.  Filed Weights   03/04/14 1539 03/05/14 0451  Weight: 215 lb 6.2 oz (97.7 kg) 213 lb 6.5 oz (96.8 kg)    Labs  CBC  Recent Labs  03/04/14 1155 03/05/14 0420 03/06/14 0305  WBC 7.3 6.6 7.1  NEUTROABS 5.0  --   --   HGB 15.8 13.5 13.3  HCT 43.6 38.2* 37.3*  MCV 91.6 91.6 92.1  PLT 230 204 205   Basic Metabolic Panel  Recent Labs  03/04/14 1155 03/05/14 0420  NA 138 138  K 4.2 4.2  CL 99 102  CO2 22 22  GLUCOSE 82 92  BUN 17 21  CREATININE 1.05 1.07  CALCIUM 9.6 8.9   Liver Function Tests  Recent Labs  03/04/14 1155  AST 22  ALT 16  ALKPHOS 122*  BILITOT 0.8  PROT 8.1  ALBUMIN 4.1   Cardiac Enzymes  Recent Labs  03/04/14 1950 03/04/14 2300 03/05/14 0612  TROPONINI 1.63* 1.73* 1.38*   Fasting Lipid Panel  Recent Labs  03/05/14 0420  CHOL 140  HDL 37*  LDLCALC 83  TRIG 161102  CHOLHDL 3.8   Disposition  Pt is being discharged home today in good condition.  Follow-up Plans & Appointments  Follow-up Information   Follow up with Nicolasa Duckinghristopher Berge, NP On 03/12/2014. (10:00 AM)    Specialty:  Nurse Practitioner   Contact information:   [address] [place] [address] [phone]      Discharge Medications    Medication List    STOP taking these medications       ticagrelor 90 MG Tabs tablet    Commonly known as:  BRILINTA      TAKE these medications       ALPRAZolam 0.5 MG tablet  Commonly known as:  XANAX  Take 1 tablet (0.5 mg total) by mouth 2 (two) times daily as needed for anxiety.     aspirin 81 MG chewable tablet  Chew 1 tablet (81 mg total) by mouth daily.     carvedilol 6.25 MG tablet  Commonly known as:  COREG  Take 1 tablet (6.25 mg total) by mouth 2 (two) times daily with a meal.     lisinopril 5 MG tablet  Commonly known as:  PRINIVIL,ZESTRIL  Take 1 tablet (5 mg total) by mouth daily.     lovastatin 40 MG tablet  Commonly known as:  MEVACOR  Take 1 tablet (40 mg total) by mouth at bedtime.     nitroGLYCERIN 0.4 MG SL tablet  Commonly known as:  NITROSTAT  Place 1 tablet (0.4 mg total) under the tongue every 5 (five) minutes as needed for chest pain.     prasugrel 10 MG Tabs tablet  Commonly known as:  EFFIENT  Take 1 tablet (10 mg total) by mouth at bedtime.       Outstanding Labs/Studies  F/U lipids/lft's in 6 wks.  Duration of Discharge Encounter   Greater than 30 minutes including physician time.  SignedNicolasa Ducking, Christopher Berge NP 03/06/2014, 10:02 AM

## 2014-03-11 ENCOUNTER — Ambulatory Visit: Payer: Self-pay | Attending: Internal Medicine | Admitting: Internal Medicine

## 2014-03-11 ENCOUNTER — Encounter: Payer: Self-pay | Admitting: Internal Medicine

## 2014-03-11 VITALS — BP 126/82 | HR 67 | Temp 98.0°F | Resp 16

## 2014-03-11 DIAGNOSIS — I2119 ST elevation (STEMI) myocardial infarction involving other coronary artery of inferior wall: Secondary | ICD-10-CM | POA: Insufficient documentation

## 2014-03-11 DIAGNOSIS — R079 Chest pain, unspecified: Secondary | ICD-10-CM | POA: Insufficient documentation

## 2014-03-11 DIAGNOSIS — F419 Anxiety disorder, unspecified: Secondary | ICD-10-CM | POA: Insufficient documentation

## 2014-03-11 DIAGNOSIS — Z139 Encounter for screening, unspecified: Secondary | ICD-10-CM | POA: Insufficient documentation

## 2014-03-11 DIAGNOSIS — G8929 Other chronic pain: Secondary | ICD-10-CM

## 2014-03-11 DIAGNOSIS — K5900 Constipation, unspecified: Secondary | ICD-10-CM | POA: Insufficient documentation

## 2014-03-11 DIAGNOSIS — R0789 Other chest pain: Secondary | ICD-10-CM

## 2014-03-11 DIAGNOSIS — M545 Low back pain, unspecified: Secondary | ICD-10-CM | POA: Insufficient documentation

## 2014-03-11 DIAGNOSIS — E785 Hyperlipidemia, unspecified: Secondary | ICD-10-CM | POA: Insufficient documentation

## 2014-03-11 HISTORY — DX: Other chronic pain: G89.29

## 2014-03-11 HISTORY — DX: Low back pain, unspecified: M54.50

## 2014-03-11 LAB — COMPLETE METABOLIC PANEL WITH GFR
ALT: 21 U/L (ref 0–53)
AST: 37 U/L (ref 0–37)
Albumin: 4.5 g/dL (ref 3.5–5.2)
Alkaline Phosphatase: 91 U/L (ref 39–117)
BUN: 15 mg/dL (ref 6–23)
CALCIUM: 9.1 mg/dL (ref 8.4–10.5)
CHLORIDE: 105 meq/L (ref 96–112)
CO2: 19 mEq/L (ref 19–32)
Creat: 0.83 mg/dL (ref 0.50–1.35)
GFR, Est Non African American: >89 mL/min
Glucose, Bld: 90 mg/dL (ref 70–99)
Potassium: 5.6 mEq/L — ABNORMAL HIGH (ref 3.5–5.3)
SODIUM: 137 meq/L (ref 135–145)
TOTAL PROTEIN: 7.2 g/dL (ref 6.0–8.3)
Total Bilirubin: 0.6 mg/dL (ref 0.2–1.2)

## 2014-03-11 LAB — HEMOGLOBIN A1C
HEMOGLOBIN A1C: 5.3 % (ref <5.7)
Mean Plasma Glucose: 105 mg/dL (ref <117)

## 2014-03-11 MED ORDER — MAGNESIUM HYDROXIDE 400 MG/5ML PO SUSP: 5.0000 mL | Freq: Every day | ORAL | Status: DC | PRN

## 2014-03-11 MED ORDER — ALPRAZOLAM 0.5 MG PO TABS: 0.5000 mg | ORAL_TABLET | Freq: Two times a day (BID) | ORAL | Status: DC | PRN

## 2014-03-11 NOTE — Progress Notes (Signed)
Patient Demographics  Mitchell LooseDewayne Kosloski, is a 53 y.o. male  ZOX:[id number]:[id number]  WJX:[id number]  DOB - [birth year]  CC:  Chief Complaint  Patient presents with  . Hospitalization Follow-up       HPI: Mitchell [name] is a 53 y.o. male here today to establish medical care.Patient was recently hospitalized and found to have an acute inferior ST elevation MI with occluded RCA patient underwent cardiac cath her and had  drug-eluting stent placed, initially was started on bladder which caused him symptoms of shortness of breath subsequently it was changed to Effient patient reports improvement in shortness of breath but currently he mentioned he still has on and off chest pain which is more on right side nonradiating which is sharp and last for 10-15 seconds, as per patient he does not have the pain right now, chest pain which he had prior to the MI which was heavy pressure-like, his blood pressure is well controlled  and has been compliant with his medications his EKG today shows sinus bradycardia but has minimal ST-T changes compared to last one . Patient is also complaining of constipation he has tried S2 soft but with minimal BM, also has chronic low back pain patient denies any recent fall or trauma, and the pain sometimes radiate down to the legs. Patient has No headache, No chest pain, No abdominal pain - No Nausea, No new weakness tingling or numbness, No Cough - SOB.  No Known Allergies Past Medical History  Diagnosis Date  . Hyperlipidemia   . CAD (coronary artery disease)     a. 02/2014 Inf STEMI/PCI: LM nl, LAD min irregs, Diags 1/2/3 min irregs, LCX min irregs, OM1/2/3 min irregs, RI min irregs, RCA 100p (3.5x18 Xience DES), PDA/PL nl, EF 45-50% basal inf HK.  Marland Kitchen. NSVT (nonsustained ventricular tachycardia)     a. in the setting of inferior STEMI;  b. 02/2014 Echo: EF 55-60%, Gr 1 DD, basal-midinferolateral/inf HK, Gr 1 DD, triv TR.   Current Outpatient Prescriptions on File Prior  to Visit  Medication Sig Dispense Refill  . aspirin 81 MG chewable tablet Chew 1 tablet (81 mg total) by mouth daily.      . carvedilol (COREG) 6.25 MG tablet Take 1 tablet (6.25 mg total) by mouth 2 (two) times daily with a meal.  60 tablet  10  . lisinopril (PRINIVIL,ZESTRIL) 5 MG tablet Take 1 tablet (5 mg total) by mouth daily.  30 tablet  10  . lovastatin (MEVACOR) 40 MG tablet Take 1 tablet (40 mg total) by mouth at bedtime.  30 tablet  6  . nitroGLYCERIN (NITROSTAT) 0.4 MG SL tablet Place 1 tablet (0.4 mg total) under the tongue every 5 (five) minutes as needed for chest pain.  25 tablet  3  . prasugrel (EFFIENT) 10 MG TABS tablet Take 1 tablet (10 mg total) by mouth at bedtime.  30 tablet  6   No current facility-administered medications on file prior to visit.   Family History  Problem Relation Age of Onset  . Heart attack Brother 51  . Hypertension Father    History   Social History  . Marital Status: Legally Separated    Spouse Name: N/A    Number of Children: N/A  . Years of Education: N/A   Occupational History  . Not on file.   Social History Main Topics  . Smoking status: Never Smoker   . Smokeless tobacco: Not on file  . Alcohol Use: No  . Drug  Use: Not on file  . Sexual Activity: Not on file   Other Topics Concern  . Not on file   Social History Narrative  . No narrative on file    Review of Systems: Constitutional: Negative for fever, chills, diaphoresis, activity change, appetite change and fatigue. HENT: Negative for ear pain, nosebleeds, congestion, facial swelling, rhinorrhea, neck pain, neck stiffness and ear discharge.  Eyes: Negative for pain, discharge, redness, itching and visual disturbance. Respiratory: Negative for cough, choking, chest tightness, shortness of breath, wheezing and stridor.  Cardiovascular: Negative for chest pain, palpitations and leg swelling. Gastrointestinal: Negative for abdominal distention. Genitourinary: Negative  for dysuria, urgency, frequency, hematuria, flank pain, decreased urine volume, difficulty urinating and dyspareunia.  Musculoskeletal: Negative for back pain, joint swelling, arthralgia and gait problem. Neurological: Negative for dizziness, tremors, seizures, syncope, facial asymmetry, speech difficulty, weakness, light-headedness, numbness and headaches.  Hematological: Negative for adenopathy. Does not bruise/bleed easily. Psychiatric/Behavioral: Negative for hallucinations, behavioral problems, confusion, dysphoric mood, decreased concentration and agitation.    Objective:   Filed Vitals:   03/11/14 1443  BP: 126/82  Pulse: 67  Temp: 98 F (36.7 C)  Resp: 16    Physical Exam: Constitutional: Patient appears well-developed and well-nourished. No distress. HENT: Normocephalic, atraumatic, External right and left ear normal. Oropharynx is clear and moist.  Eyes: Conjunctivae and EOM are normal. PERRLA, no scleral icterus. Neck: Normal ROM. Neck supple. No JVD. No tracheal deviation. No thyromegaly. CVS: RRR, S1/S2 +, no murmurs, no gallops, no carotid bruit.  Pulmonary: Effort and breath sounds normal, no stridor, rhonchi, wheezes, rales.  Abdominal: Soft. BS +, no distension, tenderness, rebound or guarding.  Musculoskeletal: Normal range of motion. No edema and no tenderness.  Lymphadenopathy: No lymphadenopathy noted, cervical, inguinal or axillary Neuro: Alert. Normal reflexes, muscle tone coordination. No cranial nerve deficit. Skin: Skin is warm and dry. No rash noted. Not diaphoretic. No erythema. No pallor. Psychiatric: Normal mood and affect. Behavior, judgment, thought content normal.  Lab Results  Component Value Date   WBC 7.1 03/06/2014   HGB 13.3 03/06/2014   HCT 37.3* 03/06/2014   MCV 92.1 03/06/2014   PLT 205 03/06/2014   Lab Results  Component Value Date   CREATININE 1.07 03/05/2014   BUN 21 03/05/2014   NA 138 03/05/2014   K 4.2 03/05/2014   CL 102  03/05/2014   CO2 22 03/05/2014    Lab Results  Component Value Date   HGBA1C 5.3 02/28/2014   Lipid Panel     Component Value Date/Time   CHOL 140 03/05/2014 0420   TRIG 102 03/05/2014 0420   HDL 37* 03/05/2014 0420   CHOLHDL 3.8 03/05/2014 0420   VLDL 20 03/05/2014 0420   LDLCALC 83 03/05/2014 0420       Assessment and plan:   1. Other chest pain  EKG shows sinus bradycardia has minimal ST-T changes in V2 V4 V5., I have sent troponin level, if there is elevation from the baseline then patient will need to go to the ER.  2. history of ST elevation myocardial infarction (STEMI) of inferior wall  Patient is currently on aspirin statin and ACE inhibitor Effient. Scheduled to follow up with cardiologist tomorrow  3. Constipation, unspecified constipation type  - magnesium hydroxide (MILK OF MAGNESIA) 400 MG/5ML suspension; Take 5 mLs by mouth daily as needed for mild constipation.  Dispense: 360 mL; Refill: 0  4. Hyperlipidemia Patient is currently on lovastatin, will do repeat panel in 3 months.  5. Anxiety  - ALPRAZolam (XANAX) 0.5 MG tablet; Take 1 tablet (0.5 mg total) by mouth 2 (two) times daily as needed for anxiety.  Dispense: 20 tablet; Refill: 0  6. Chronic low back pain  - DG Lumbar Spine Complete; Future  7. Screening  - Vit D  25 hydroxy (rtn osteoporosis monitoring) - Hemoglobin A1c        Health Maintenance Patient declined flu shot.  Return in about 3 months (around 06/11/2014) for hypertension, hyperipidemia.    The patient was given clear instructions to go to ER or return to medical center if symptoms don't improve, worsen or new problems develop. The patient verbalized understanding. The patient was told to call to get lab results if they haven't heard anything in the next week.     Doris CheadleADVANI, Anara Cowman, MD

## 2014-03-11 NOTE — Progress Notes (Deleted)
MRN: [id number] Name: Mitchell LooseDewayne Maertens  Sex: male Age: 53 y.o. DOB: April 05, 1961  Allergies: Review of patient's allergies indicates no known allergies.  Chief Complaint  Patient presents with  . Hospitalization Follow-up    HPI: Patient is 53 y.o. male who   Past Medical History  Diagnosis Date  . Hyperlipidemia   . CAD (coronary artery disease)     a. 02/2014 Inf STEMI/PCI: LM nl, LAD min irregs, Diags 1/2/3 min irregs, LCX min irregs, OM1/2/3 min irregs, RI min irregs, RCA 100p (3.5x18 Xience DES), PDA/PL nl, EF 45-50% basal inf HK.  Marland Kitchen. NSVT (nonsustained ventricular tachycardia)     a. in the setting of inferior STEMI;  b. 02/2014 Echo: EF 55-60%, Gr 1 DD, basal-midinferolateral/inf HK, Gr 1 DD, triv TR.    Past Surgical History  Procedure Laterality Date  . Cardiac catheterization      02/28/2014 prox RCA single vessel occlusion treated with DES      Medication List       This list is accurate as of: 03/11/14  3:35 PM.  Always use your most recent med list.               ALPRAZolam 0.5 MG tablet  Commonly known as:  XANAX  Take 1 tablet (0.5 mg total) by mouth 2 (two) times daily as needed for anxiety.     aspirin 81 MG chewable tablet  Chew 1 tablet (81 mg total) by mouth daily.     carvedilol 6.25 MG tablet  Commonly known as:  COREG  Take 1 tablet (6.25 mg total) by mouth 2 (two) times daily with a meal.     lisinopril 5 MG tablet  Commonly known as:  PRINIVIL,ZESTRIL  Take 1 tablet (5 mg total) by mouth daily.     lovastatin 40 MG tablet  Commonly known as:  MEVACOR  Take 1 tablet (40 mg total) by mouth at bedtime.     magnesium hydroxide 400 MG/5ML suspension  Commonly known as:  MILK OF MAGNESIA  Take 5 mLs by mouth daily as needed for mild constipation.     nitroGLYCERIN 0.4 MG SL tablet  Commonly known as:  NITROSTAT  Place 1 tablet (0.4 mg total) under the tongue every 5 (five) minutes as needed for chest pain.     prasugrel 10 MG  Tabs tablet  Commonly known as:  EFFIENT  Take 1 tablet (10 mg total) by mouth at bedtime.        Meds ordered this encounter  Medications  . magnesium hydroxide (MILK OF MAGNESIA) 400 MG/5ML suspension    Sig: Take 5 mLs by mouth daily as needed for mild constipation.    Dispense:  360 mL    Refill:  0  . ALPRAZolam (XANAX) 0.5 MG tablet    Sig: Take 1 tablet (0.5 mg total) by mouth 2 (two) times daily as needed for anxiety.    Dispense:  20 tablet    Refill:  0     There is no immunization history on file for this patient.  Family History  Problem Relation Age of Onset  . Heart attack Brother 51  . Hypertension Father     History  Substance Use Topics  . Smoking status: Never Smoker   . Smokeless tobacco: Not on file  . Alcohol Use: No    Review of Systems   As noted in HPI  Filed Vitals:   03/11/14 1443  BP: 126/82  Pulse: 67  Temp: 98 F (36.7 C)  Resp: 16    Physical Exam  Physical Exam  CBC    Component Value Date/Time   WBC 7.1 03/06/2014 0305   RBC 4.05* 03/06/2014 0305   HGB 13.3 03/06/2014 0305   HCT 37.3* 03/06/2014 0305   PLT 205 03/06/2014 0305   MCV 92.1 03/06/2014 0305   LYMPHSABS 1.5 03/04/2014 1155   MONOABS 0.6 03/04/2014 1155   EOSABS 0.2 03/04/2014 1155   BASOSABS 0.0 03/04/2014 1155    CMP     Component Value Date/Time   NA 138 03/05/2014 0420   K 4.2 03/05/2014 0420   CL 102 03/05/2014 0420   CO2 22 03/05/2014 0420   GLUCOSE 92 03/05/2014 0420   BUN 21 03/05/2014 0420   CREATININE 1.07 03/05/2014 0420   CALCIUM 8.9 03/05/2014 0420   PROT 8.1 03/04/2014 1155   ALBUMIN 4.1 03/04/2014 1155   AST 22 03/04/2014 1155   ALT 16 03/04/2014 1155   ALKPHOS 122* 03/04/2014 1155   BILITOT 0.8 03/04/2014 1155   GFRNONAA 77* 03/05/2014 0420   GFRAA 90* 03/05/2014 0420    Lab Results  Component Value Date/Time   CHOL 140 03/05/2014  4:20 AM    No components found with this basename: hga1c    Lab Results  Component  Value Date/Time   AST 22 03/04/2014 11:55 AM    Assessment and Plan  Constipation, unspecified constipation type - Plan: magnesium hydroxide (MILK OF MAGNESIA) 400 MG/5ML suspension  ST elevation myocardial infarction (STEMI) of inferior wall - Plan: Troponin I  Hyperlipidemia  Anxiety - Plan: ALPRAZolam (XANAX) 0.5 MG tablet  Chronic low back pain - Plan: DG Lumbar Spine Complete  Other chest pain - Plan: COMPLETE METABOLIC PANEL WITH GFR  Screening - Plan: Vit D  25 hydroxy (rtn osteoporosis monitoring), Hemoglobin A1c    Return in about 3 months (around 06/11/2014) for hypertension, hyperipidemia.  Doris CheadleADVANI, Royer Cristobal, MD

## 2014-03-11 NOTE — Progress Notes (Signed)
Patient here for hospital follow up Was admitted for heart attack-stent was placed to right side Today complains of chest pain with some SOB Patient also complains of lower back pain that radiates down his legs

## 2014-03-12 ENCOUNTER — Encounter: Payer: Self-pay | Admitting: Nurse Practitioner

## 2014-03-12 ENCOUNTER — Ambulatory Visit (INDEPENDENT_AMBULATORY_CARE_PROVIDER_SITE_OTHER): Payer: Self-pay | Admitting: Nurse Practitioner

## 2014-03-12 VITALS — BP 124/80 | HR 62 | Ht 70.0 in | Wt 211.4 lb

## 2014-03-12 DIAGNOSIS — I2510 Atherosclerotic heart disease of native coronary artery without angina pectoris: Secondary | ICD-10-CM

## 2014-03-12 DIAGNOSIS — I10 Essential (primary) hypertension: Secondary | ICD-10-CM

## 2014-03-12 DIAGNOSIS — E785 Hyperlipidemia, unspecified: Secondary | ICD-10-CM

## 2014-03-12 DIAGNOSIS — I2119 ST elevation (STEMI) myocardial infarction involving other coronary artery of inferior wall: Secondary | ICD-10-CM

## 2014-03-12 LAB — TROPONIN I: TROPONIN I: 0.04 ng/mL (ref <0.06)

## 2014-03-12 LAB — VITAMIN D 25 HYDROXY (VIT D DEFICIENCY, FRACTURES): Vit D, 25-Hydroxy: 14 ng/mL — ABNORMAL LOW (ref 30–89)

## 2014-03-12 NOTE — Patient Instructions (Addendum)
Your physician recommends that you continue on your current medications as directed. Please refer to the Current Medication list given to you today.   Your physician recommends that you return for lab work in: IN 6 WEEKS FASTING LIPIDS LFT AND LIPIDS    Your physician recommends that you schedule a follow-up appointment in: [facility] MON[facility]

## 2014-03-12 NOTE — Progress Notes (Signed)
Patient Name: Mitchell [name] Date of Encounter: 03/12/2014  Primary Care Provider:  Doris CheadleADVANI, DEEPAK, MD Primary Cardiologist:  Golden Circle. McLean, MD   Patient Profile  [age] y/oDoyce [name] male s/p recent STEMI and PCI of the RCA who presents for f/u.  Problem List   Past Medical History  Diagnosis Date  . Hyperlipidemia   . CAD (coronary artery disease)     a. 02/2014 Inf STEMI/PCI: LM nl, LAD min irregs, Diags 1/2/3 min irregs, LCX min irregs, OM1/2/3 min irregs, RI min irregs, RCA 100p (3.5x18 Xience DES), PDA/PL nl, EF 45-50% basal inf HK.  Marland Kitchen. NSVT (nonsustained ventricular tachycardia)     a. in the setting of inferior STEMI;  b. 02/2014 Echo: EF 55-60%, Gr 1 DD, basal-midinferolateral/inf HK, Gr 1 DD, triv TR.   Past Surgical History  Procedure Laterality Date  . Cardiac catheterization      02/28/2014 prox RCA single vessel occlusion treated with DES    Allergies  No Known Allergies  HPI  53 y/o male with the above problem list.  [facility] and underwent DES to the RCA.  He was subsequently d/c'd on asa and brilinta but developed dyspnea at home and returned to the ER.  His troponin was still up but subsequently trended down.  He was switched from brilinta to effient.  Since his most recent discharge, he has been experiencing a productive cough without fevers/chills.  This has been getting better.  He has been afraid to use cough medicine b/c of his recent events.  With coughing, he has had some chest wall pain/pleuritic pain and he has also had general malaise and intermittent lightheadedness.  He denies angina, palpitations, dyspnea, pnd, orthopnea, n, v, dizziness, syncope, edema, weight gain, or early satiety.   He has been compliant with meds.  Home Medications  Prior to Admission medications   Medication Sig Start Date End Date Taking? Authorizing Provider  ALPRAZolam Prudy Feeler(XANAX) 0.5 MG tablet Take 1 tablet (0.5 mg total) by mouth 2 (two) times  daily as needed for anxiety. 03/11/14  Yes Doris Cheadleeepak Advani, MD  aspirin 81 MG chewable tablet Chew 1 tablet (81 mg total) by mouth daily. 03/01/14  Yes Jodelle GrossKathryn M Lawrence, NP  carvedilol (COREG) 6.25 MG tablet Take 1 tablet (6.25 mg total) by mouth 2 (two) times daily with a meal. 03/01/14  Yes Jodelle GrossKathryn M Lawrence, NP  lisinopril (PRINIVIL,ZESTRIL) 5 MG tablet Take 1 tablet (5 mg total) by mouth daily. 03/01/14  Yes Jodelle GrossKathryn M Lawrence, NP  lovastatin (MEVACOR) 40 MG tablet Take 1 tablet (40 mg total) by mouth at bedtime. 03/01/14  Yes Jodelle GrossKathryn M Lawrence, NP  magnesium hydroxide (MILK OF MAGNESIA) 400 MG/5ML suspension Take 5 mLs by mouth daily as needed for mild constipation. 03/11/14  Yes Doris Cheadleeepak Advani, MD  nitroGLYCERIN (NITROSTAT) 0.4 MG SL tablet Place 1 tablet (0.4 mg total) under the tongue every 5 (five) minutes as needed for chest pain. 03/06/14  Yes Ok Anishristopher R Laylani Pudwill, NP  prasugrel (EFFIENT) 10 MG TABS tablet Take 1 tablet (10 mg total) by mouth at bedtime. 03/06/14  Yes Ok Anishristopher R Estrella Alcaraz, NP    Review of Systems  Productive cough - improving.  Chest wall/pleuritic cp as above.  All other systems reviewed and are otherwise negative except as noted above.  Physical Exam  Blood pressure 124/80, pulse 62, height 5\' 10"  (1.778 m), weight 211 lb 6.4 oz (95.89 kg).  Orthostatic VS for the past 24 hrs:  BP- Lying Pulse- Lying BP- Sitting Pulse- Sitting BP- Standing at 0 minutes Pulse- Standing at 0 minutes  03/12/14 1049 112/68 mmHg 53 117/75 mmHg 59 110/73 mmHg 72  03/12/14 1046 112/53 mmHg 53 117/75 mmHg 59 110/73 mmHg 72   General: Pleasant, NAD Psych: Normal affect. Neuro: Alert and oriented X 3. Moves all extremities spontaneously. HEENT: Normal  Neck: Supple without bruits or JVD. Lungs:  Resp regular and unlabored, CTA. Heart: RRR no s3, s4, or murmurs. Abdomen: Soft, non-tender, non-distended, BS + x 4.  Extremities: No clubbing, cyanosis or edema. DP/PT/Radials 2+ and equal  bilaterally.  Accessory Clinical Findings  ECG - SB, 58, inf infarct, inf twi - no acute st/t changes.  Assessment & Plan  1.  Inferior STEMI, subsequent episode of care/CAD:  S/p PCI/DES of the RCA earlier this month w/ subsequent readmission 2/2 dyspnea in the setting of brilinta usage.  This has since resolved since switching to effient, which he is tolerating well.  He is not having angina or doe.  Cont asa, bb, acei, statin, effient.  He does not have insurance and thus is not planning on enrolling in cardiac rehab.  2.  Cough:  He's had a productive cough since his hospitalization.  This has been gradually getting better.  He has not had fever or chills.  He does have some pleuritic c/p in relationship to his cough.  Lung exam is nl.  I've advised that he may use otc robitussin/guiafenesin so long as he is not using decongestants.  3.  HTN: BP stable.  Checked orthostatics 2/2 complaints of lightheadedness when he stands sometimes.  He is not significantly orthostatic on exam and c/o lightheadedness throughout the exam - even while lying.  I recommended that he get up more slowly but will not change his meds today.  I suspect some of his dizziness may be tied to his URI.  4.  HL:  Cont statin therapy.  LDL 83, nl LFT's last week.  5.  NSVT:  In setting of MI.  Cont bb.  6.  Dispo: F/U Dr. Shirlee LatchMcLean in 3 mos or sooner if necessary.  F/U lipids/lft's in 6 wks.  Nicolasa Duckinghristopher Neyra Pettie, NP 03/12/2014, 1:26 PM

## 2014-03-16 ENCOUNTER — Telehealth: Payer: Self-pay | Admitting: Emergency Medicine

## 2014-03-16 ENCOUNTER — Encounter: Payer: Self-pay | Admitting: Internal Medicine

## 2014-03-16 ENCOUNTER — Ambulatory Visit (HOSPITAL_COMMUNITY)
Admission: RE | Admit: 2014-03-16 | Discharge: 2014-03-16 | Disposition: A | Discharge status: Home / Self Care | Payer: Self-pay | Source: Ambulatory Visit | Attending: Internal Medicine | Admitting: Internal Medicine

## 2014-03-16 DIAGNOSIS — M545 Low back pain, unspecified: Secondary | ICD-10-CM

## 2014-03-16 DIAGNOSIS — G8929 Other chronic pain: Secondary | ICD-10-CM

## 2014-03-16 DIAGNOSIS — N200 Calculus of kidney: Secondary | ICD-10-CM | POA: Insufficient documentation

## 2014-03-16 MED ORDER — VITAMIN D (ERGOCALCIFEROL) 1.25 MG (50000 UNIT) PO CAPS: 50000.0000 [IU] | ORAL_CAPSULE | ORAL | Status: DC

## 2014-03-16 NOTE — Telephone Encounter (Signed)
Message copied by Darlis LoanSMITH, Ulice Follett D on Mon Mar 16, 2014  3:04 PM ------      Message from: Doris CheadleADVANI, DEEPAK      Created: Thu Mar 12, 2014 10:28 AM       Blood work reviewed, noticed low vitamin D, call patient advise to start ergocalciferol 50,000 units once a week for the duration of  12 weeks.      Also let the patient know that his cardiac enzyme is in normal range. ------

## 2014-03-16 NOTE — Telephone Encounter (Signed)
Pt given lab results with instructions to take prescribed Vitamin D 50,000 units x 12 weeks Medication e-scribed to [facility]  Also informed normal blood work

## 2014-03-17 ENCOUNTER — Telehealth: Payer: Self-pay | Admitting: *Deleted

## 2014-03-17 DIAGNOSIS — R0789 Other chest pain: Secondary | ICD-10-CM

## 2014-03-17 NOTE — ED Notes (Signed)
Pt started experiencing chest pressure that he states feels like gas or muscle tightening. Pt reports starting BP medication a couple of days ago for the first time.He denies shortness of breath,nausea or vomiting.

## 2014-03-17 NOTE — ED Provider Notes (Signed)
HPI Comments: 53 y.o. male with past medical history significant for HTN who presents from home with chief complaint of chest pain. Pt reports that he ran this morning per his usual exercise regimen and "felt fine" before experiencing his intermittent left-sided chest pain that "feels muscular or the bones, not my heart" with episodes that last approximately 10 seconds. Pt states his pain started approximately 13.5 hours ago. Pt notes that after experiencing his CP he noticed blood pressure readings being in the "140's, 160's, and 170's" throughout the day. Pt notes he has been under increased stress lately. Pt also reports he was recently rx'd HTN medications that he has been taking for 4 days since being prescribed his meds. Pt denies n/v/d/c, fever, cough, congestion, abdominal pain, back pain, dizziness, lightheadedness currently in the ED, hx of abdominal (outside of hernia repair as a child) or chest surgeries, HA, current numbness/pain/swelling to BLE or BUE, or chills. There are no other acute medical concerns at this time.    Social hx: -tobacco, -EtOH, -drugs  Significant FMHx: grandfather had an MI  PCP: Dr. Randell Loop    Note written by Agustin Cree, Scribe, as dictated by Kathrene Alu, MD 11:19 PM    -      The history is provided by the patient. No language interpreter was used.                                    Past Medical History   Diagnosis Date   ??? Hypertension    ;     Past Surgical History   Procedure Laterality Date   ??? Hx hernia repair  1961         History reviewed. No pertinent family history.     History     Social History   ??? Marital Status: N/A     Spouse Name: N/A     Number of Children: N/A   ??? Years of Education: N/A     Occupational History   ??? Not on file.     Social History Main Topics   ??? Smoking status: Never Smoker    ??? Smokeless tobacco: Not on file   ??? Alcohol Use: No   ??? Drug Use: No   ??? Sexual Activity: Not on file     Other Topics Concern   ??? Not on file      Social History Narrative   ??? No narrative on file                  ALLERGIES: Pcn and Sulfa (sulfonamide antibiotics)      Review of Systems   Constitutional: Negative for fever and chills.        +HTN (see HPI)   HENT: Negative for congestion.    Respiratory: Negative for cough.    Cardiovascular: Positive for chest pain (left-sided). Negative for leg swelling.   Gastrointestinal: Negative for nausea, vomiting, abdominal pain, diarrhea and constipation.   Musculoskeletal: Negative for myalgias (BUE or BLE), back pain and joint swelling (BUE).   Neurological: Negative for dizziness, light-headedness, numbness (BLE or BUE) and headaches.   All other systems reviewed and are negative.      Filed Vitals:    03/17/14 2301   BP: 178/88   Pulse: 91   Temp: 98.3 ??F (36.8 ??C)   Resp: 18   Height: 5\' 9"  (1.753 m)   Weight:  56.7 kg (125 lb)   SpO2: 99%            Physical Exam   Nursing note and vitals reviewed.       CONSTITUTIONAL: Well-appearing; well-nourished; in no apparent distress  HEAD: Normocephalic; atraumatic  EYES: PERRL; EOM intact; conjunctiva and sclera are clear bilaterally.  ENT: No rhinorrhea; normal pharynx with no tonsillar hypertrophy; mucous membranes pink/moist, no erythema, no exudate.  NECK: Supple; non-tender; no cervical lymphadenopathy  CARD: Normal S1, S2; no murmurs, rubs, or gallops. Regular rate and rhythm.  RESP: Normal respiratory effort; breath sounds clear and equal bilaterally; no wheezes, rhonchi, or rales.  ABD: Normal bowel sounds; non-distended; non-tender; no palpable organomegaly, no masses, no bruits.  Back Exam: Normal inspection; no vertebral point tenderness, no CVA tenderness. Normal range of motion.  EXT: Normal ROM in all four extremities; non-tender to palpation; no swelling or deformity; distal pulses are normal, no edema.  SKIN: Warm; dry; no rash.  NEURO:Alert and oriented x 3, coherent, NII-XII grossly intact, sensory and motor are non-focal.        MDM   Number of Diagnoses or Management Options  Atypical chest pain:   Essential hypertension:   Diagnosis management comments: Assessment: atypical chest pain rule out ACS/ hypertension urgency in a patient with stable vital signs and COPD. The patient appears very anxious and restless.        Plan: EKG/ chest x-ray/ lab/ IV fluid/ ultrasound renal/ Monitor and Reevaluate.         Amount and/or Complexity of Data Reviewed  Clinical lab tests: ordered and reviewed  Tests in the radiology section of CPT??: ordered and reviewed  Tests in the medicine section of CPT??: reviewed and ordered  Discussion of test results with the performing providers: yes  Decide to obtain previous medical records or to obtain history from someone other than the patient: yes  Obtain history from someone other than the patient: yes  Review and summarize past medical records: yes  Discuss the patient with other providers: yes  Independent visualization of images, tracings, or specimens: yes    Risk of Complications, Morbidity, and/or Mortality  Presenting problems: moderate  Diagnostic procedures: moderate  Management options: moderate                            Diagnoses that have been ruled out:   None   Diagnoses that are still under consideration:   None   Final diagnoses:   None       Procedures     ED EKG interpretation:  Rhythm: normal sinus rhythm; and regular . Rate (approx.): 82; Axis: normal; P wave: normal; QRS interval: normal ; ST/T wave: normal; in  Lead: diffusely; LVH; Other findings: borderline ekg. This EKG was interpreted by Kathrene AluWilliam N Mildreth Reek, MD,ED Provider.    XRAY INTERPRETATION (ED MD)  Chest Xray  No acute process seen. Normal heart size. No bony abnormalities. No infiltrate.  Kathrene AluWilliam N Yasmen Cortner, MD 11:32 PM        Progress Note:   Pt has been reexamined by Kathrene AluWilliam N Kamil Hanigan, MD. Pt is feeling much better. Symptoms have improved. All available results have been reviewed with pt  and any available family. Pt understands sx, dx, and tx in ED. Care plan has been outlined and questions have been answered. Pt is ready to go home. Will send home on HTN/ Chest pain instructions. Outpatient referral with PCP as  needed. Written by Kathrene AluWilliam N Jaylin Roundy, MD,12:53 AM    .   .

## 2014-03-17 NOTE — ED Notes (Addendum)
Pt ambulated to XR via XR tech

## 2014-03-17 NOTE — Telephone Encounter (Signed)
Message copied by Farrell OursEVANS, Thomasene Dubow K on Tue Mar 17, 2014  4:08 PM ------      Message from: Doris CheadleADVANI, DEEPAK      Created: Thu Mar 12, 2014 10:28 AM       Blood work reviewed, noticed low vitamin D, call patient advise to start ergocalciferol 50,000 units once a week for the duration of  12 weeks.      Also let the patient know that his cardiac enzyme is in normal range. ------

## 2014-03-17 NOTE — Telephone Encounter (Signed)
Spoke with patient and informed him of normal labs and rx sent in

## 2014-03-18 ENCOUNTER — Inpatient Hospital Stay
Admit: 2014-03-18 | Discharge: 2014-03-18 | Disposition: A | Discharge status: Home / Self Care | Payer: BLUE CROSS/BLUE SHIELD | Attending: Emergency Medicine

## 2014-03-18 LAB — METABOLIC PANEL, COMPREHENSIVE
A-G Ratio: 1.3 (ref 1.1–2.2)
ALT (SGPT): 42 U/L (ref 12–78)
AST (SGOT): 29 U/L (ref 15–37)
Albumin: 4.2 g/dL (ref 3.5–5.0)
Alk. phosphatase: 51 U/L (ref 45–117)
Anion gap: 10 mmol/L (ref 5–15)
BUN/Creatinine ratio: 15 (ref 12–20)
BUN: 14 MG/DL (ref 6–20)
Bilirubin, total: 0.6 MG/DL (ref 0.2–1.0)
CO2: 26 mmol/L (ref 21–32)
Calcium: 8.9 MG/DL (ref 8.5–10.1)
Chloride: 102 mmol/L (ref 97–108)
Creatinine: 0.94 MG/DL (ref 0.70–1.30)
GFR est AA: >60 mL/min/{1.73_m2} (ref >60)
GFR est non-AA: >60 mL/min/{1.73_m2} (ref >60)
Globulin: 3.3 g/dL (ref 2.0–4.0)
Glucose: 99 mg/dL (ref 65–100)
Potassium: 3.6 mmol/L (ref 3.5–5.1)
Protein, total: 7.5 g/dL (ref 6.4–8.2)
Sodium: 138 mmol/L (ref 136–145)

## 2014-03-18 LAB — CBC WITH AUTOMATED DIFF
ABS. BASOPHILS: 0 10*3/uL (ref 0.0–0.1)
ABS. EOSINOPHILS: 0.1 10*3/uL (ref 0.0–0.4)
ABS. LYMPHOCYTES: 1.8 10*3/uL (ref 0.8–3.5)
ABS. MONOCYTES: 0.9 10*3/uL (ref 0.0–1.0)
ABS. NEUTROPHILS: 4.6 10*3/uL (ref 1.8–8.0)
BASOPHILS: 0 % (ref 0–1)
EOSINOPHILS: 1 % (ref 0–7)
HCT: 42.8 % (ref 36.6–50.3)
HGB: 14.6 g/dL (ref 12.1–17.0)
LYMPHOCYTES: 25 % (ref 12–49)
MCH: 31.6 PG (ref 26.0–34.0)
MCHC: 34.1 g/dL (ref 30.0–36.5)
MCV: 92.6 FL (ref 80.0–99.0)
MONOCYTES: 13 % (ref 5–13)
NEUTROPHILS: 61 % (ref 32–75)
PLATELET: 209 10*3/uL (ref 150–400)
RBC: 4.62 M/uL (ref 4.10–5.70)
RDW: 12.7 % (ref 11.5–14.5)
WBC: 7.4 10*3/uL (ref 4.1–11.1)

## 2014-03-18 LAB — PTT: aPTT: 25.8 s (ref 22.1–32.5)

## 2014-03-18 LAB — EKG, 12 LEAD, INITIAL
Atrial Rate: 82 {beats}/min
Calculated P Axis: 70 degrees
Calculated R Axis: 77 degrees
Calculated T Axis: 60 degrees
Diagnosis: NORMAL
P-R Interval: 146 ms
Q-T Interval: 360 ms
QRS Duration: 94 ms
QTC Calculation (Bezet): 420 ms
Ventricular Rate: 82 {beats}/min

## 2014-03-18 LAB — D DIMER: D-dimer: <0.17 mg/L FEU (ref 0.00–0.65)

## 2014-03-18 LAB — CK W/ CKMB & INDEX
CK - MB: 1.6 NG/ML (ref 0.5–3.6)
CK-MB Index: 1 (ref 0–2.5)
CK: 160 U/L (ref 39–308)

## 2014-03-18 LAB — TSH 3RD GENERATION: TSH: 1.95 u[IU]/mL (ref 0.36–3.74)

## 2014-03-18 LAB — PROTHROMBIN TIME + INR
INR: 1 (ref 0.9–1.1)
Prothrombin time: 10.5 s (ref 9.0–11.1)

## 2014-03-18 LAB — BNP: BNP: 15 pg/mL (ref 0–100)

## 2014-03-18 LAB — TROPONIN I: Troponin-I, Qt.: <0.04 ng/mL (ref <0.05)

## 2014-03-18 LAB — D-DIMER, QUANTITATIVE: D-Dimer, Quant: <0.17 mg/L FEU (ref 0.00–0.65)

## 2014-03-18 MED ORDER — ASPIRIN 325 MG TAB
325 mg | Freq: Once | ORAL | Status: AC
Start: 2014-03-18 — End: 2014-03-17
  Administered 2014-03-18: 04:00:00 via ORAL

## 2014-03-18 MED FILL — BAYER ASPIRIN 325 MG TABLET: 325 mg | ORAL | Qty: 1

## 2014-03-18 NOTE — ED Notes (Signed)
I have reviewed discharge instructions with the patient.  The patient verbalized understanding.

## 2014-03-18 NOTE — ED Notes (Signed)
Pt taken to [facility]

## 2014-03-30 ENCOUNTER — Encounter: Payer: Self-pay | Admitting: Internal Medicine

## 2014-04-03 ENCOUNTER — Encounter (HOSPITAL_COMMUNITY): Payer: Self-pay | Admitting: *Deleted

## 2014-04-03 ENCOUNTER — Emergency Department (HOSPITAL_COMMUNITY)
Admission: EM | Admit: 2014-04-03 | Discharge: 2014-04-03 | Disposition: A | Discharge status: Home / Self Care | Payer: Self-pay | Attending: Emergency Medicine | Admitting: Emergency Medicine

## 2014-04-03 ENCOUNTER — Telehealth: Payer: Self-pay | Admitting: Internal Medicine

## 2014-04-03 ENCOUNTER — Telehealth: Payer: Self-pay | Admitting: Cardiology

## 2014-04-03 ENCOUNTER — Emergency Department (HOSPITAL_COMMUNITY): Payer: Self-pay

## 2014-04-03 DIAGNOSIS — Z7982 Long term (current) use of aspirin: Secondary | ICD-10-CM | POA: Insufficient documentation

## 2014-04-03 DIAGNOSIS — K5900 Constipation, unspecified: Secondary | ICD-10-CM | POA: Insufficient documentation

## 2014-04-03 DIAGNOSIS — M791 Myalgia: Secondary | ICD-10-CM | POA: Insufficient documentation

## 2014-04-03 DIAGNOSIS — Z79899 Other long term (current) drug therapy: Secondary | ICD-10-CM | POA: Insufficient documentation

## 2014-04-03 DIAGNOSIS — I252 Old myocardial infarction: Secondary | ICD-10-CM | POA: Insufficient documentation

## 2014-04-03 DIAGNOSIS — Z87442 Personal history of urinary calculi: Secondary | ICD-10-CM | POA: Insufficient documentation

## 2014-04-03 DIAGNOSIS — R10A1 Flank pain, right side: Secondary | ICD-10-CM

## 2014-04-03 DIAGNOSIS — R109 Unspecified abdominal pain: Secondary | ICD-10-CM | POA: Insufficient documentation

## 2014-04-03 DIAGNOSIS — Z9861 Coronary angioplasty status: Secondary | ICD-10-CM | POA: Insufficient documentation

## 2014-04-03 DIAGNOSIS — I2510 Atherosclerotic heart disease of native coronary artery without angina pectoris: Secondary | ICD-10-CM | POA: Insufficient documentation

## 2014-04-03 LAB — I-STAT CHEM 8, ED
BUN: 13 mg/dL (ref 6–23)
Calcium, Ion: 1.16 mmol/L (ref 1.12–1.23)
Chloride: 104 mEq/L (ref 96–112)
Creatinine, Ser: 0.9 mg/dL (ref 0.50–1.35)
Glucose, Bld: 93 mg/dL (ref 70–99)
HCT: 40 % (ref 39.0–52.0)
Hemoglobin: 13.6 g/dL (ref 13.0–17.0)
Potassium: 4 mEq/L (ref 3.7–5.3)
Sodium: 138 mEq/L (ref 137–147)
TCO2: 22 mmol/L (ref 0–100)

## 2014-04-03 LAB — URINALYSIS, ROUTINE W REFLEX MICROSCOPIC
Glucose, UA: NEGATIVE mg/dL
Hgb urine dipstick: NEGATIVE
Ketones, ur: NEGATIVE mg/dL
Leukocytes, UA: NEGATIVE
Nitrite: NEGATIVE
Protein, ur: NEGATIVE mg/dL
Specific Gravity, Urine: 1.026 (ref 1.005–1.030)
Urobilinogen, UA: 1 mg/dL (ref 0.0–1.0)
pH: 5 (ref 5.0–8.0)

## 2014-04-03 MED ORDER — CYCLOBENZAPRINE HCL 10 MG PO TABS: 10.0000 mg | ORAL_TABLET | Freq: Two times a day (BID) | ORAL | Status: DC | PRN

## 2014-04-03 MED ORDER — MORPHINE SULFATE 4 MG/ML IJ SOLN
4.0000 mg | Freq: Once | INTRAMUSCULAR | Status: AC
  Administered 2014-04-03: 4 mg via INTRAVENOUS
  Filled 2014-04-03: qty 1

## 2014-04-03 MED ORDER — SODIUM CHLORIDE 0.9 % IV BOLUS (SEPSIS)
1000.0000 mL | Freq: Once | INTRAVENOUS | Status: AC
  Administered 2014-04-03: 1000 mL via INTRAVENOUS

## 2014-04-03 MED ORDER — IBUPROFEN 600 MG PO TABS: 600.0000 mg | ORAL_TABLET | Freq: Four times a day (QID) | ORAL | Status: DC | PRN

## 2014-04-03 MED ORDER — TRAMADOL HCL 50 MG PO TABS: 50.0000 mg | ORAL_TABLET | Freq: Four times a day (QID) | ORAL | Status: DC | PRN

## 2014-04-03 MED ORDER — KETOROLAC TROMETHAMINE 30 MG/ML IJ SOLN
30.0000 mg | Freq: Once | INTRAMUSCULAR | Status: AC
  Administered 2014-04-03: 30 mg via INTRAVENOUS
  Filled 2014-04-03: qty 1

## 2014-04-03 NOTE — Telephone Encounter (Signed)
Pt. Came into facility stating that he can no longer afford prasugrel (EFFIENT) 10 MG TABS tablet medication. Pt. States that he is having lower right side abdominal pain, pt. Would like some advice. Please f/u with pt.

## 2014-04-03 NOTE — Telephone Encounter (Signed)
Finish the Effient that he has (1 more month), can then switch to Plavix 75 mg daily.

## 2014-04-03 NOTE — Telephone Encounter (Signed)
I called the pt to advise him that I have left a month supply of Effient samples at the front desk for him to pick up.  He stated that he is on 3 or 4 medications at this time that he cannot afford. He wants to know if there is a less expensive medication he can take instead of Effient. He is aware that I am sending this message to Dr Shirlee LatchMcLean for his recommendations.  Please advise.

## 2014-04-03 NOTE — ED Notes (Signed)
Pt reports right side lower back pain x 1 week, pain is now radiating around and his right groin. Denies urinary symptoms, but has hx of kidney stones.

## 2014-04-03 NOTE — Telephone Encounter (Signed)
PT  AWARE./CY 

## 2014-04-03 NOTE — Telephone Encounter (Signed)
New message     Pt cannot afford effient-  He has 1 pill left then he will stop it.  Is there something else he can get?

## 2014-04-03 NOTE — ED Provider Notes (Signed)
CSN: [id number]     Arrival date & time 04/03/14  4098 History   First MD Initiated Contact with Patient 04/03/14 [phone]     Chief Complaint  Patient presents with  . Back Pain  . Groin Pain     (Consider location/radiation/quality/duration/timing/severity/associated sxs/prior Treatment) HPI  Pt is a 53yo male with hx of MI and CAD presenting to ED with c/o right sided lower back pain x1 week that has gradually started to radiate into right flank and right groin. Pain is constant, aching, occasionally sharp in nature, 10/10 at worst. States his back and skin are tender to touch but also feels a burning sensation at times. Denies rash or redness to his skin. Denies urinary symptoms including no dysuria or hematuria. Denies scrotal pain or swelling but does state pain radiates into groin.  Denies fever, n/v/d. Reports having had plain films of his back a few weeks ago by his PCP and was advised he does have a left sided kidney stone but states pain is only on right side.  Pt does not believe he has ever passed a stone.  Denies hx of abdominal surgeries. Does states he has been more constipated since being on his cardiac medications. LBM about 3 days ago, which has been normal for him since starting his medications.     Past Medical History  Diagnosis Date  . MI (myocardial infarction)   . Coronary artery disease    Past Surgical History  Procedure Laterality Date  . Coronary angioplasty with stent placement     History reviewed. No pertinent family history. History  Substance Use Topics  . Smoking status: Not on file  . Smokeless tobacco: Not on file  . Alcohol Use: No    Review of Systems  Constitutional: Negative for fever and chills.  Respiratory: Negative for cough and shortness of breath.   Gastrointestinal: Positive for abdominal pain ( righit side) and constipation. Negative for nausea, vomiting and diarrhea.  Genitourinary: Positive for flank pain ( right). Negative for  dysuria, urgency, frequency, hematuria, decreased urine volume, discharge, penile swelling, scrotal swelling, difficulty urinating, penile pain and testicular pain.  Musculoskeletal: Positive for myalgias ( right side of back) and back pain ( right lower). Negative for neck pain and neck stiffness.  Skin: Negative for color change, rash and wound.  All other systems reviewed and are negative.     Allergies  Review of patient's allergies indicates no known allergies.  Home Medications   Prior to Admission medications   Medication Sig Start Date End Date Taking? Authorizing Provider  aspirin EC 81 MG tablet Take 81 mg by mouth daily.   Yes Historical Provider, MD  carvedilol (COREG) 6.25 MG tablet Take 6.25 mg by mouth 2 (two) times daily with a meal.   Yes Historical Provider, MD  lisinopril (PRINIVIL,ZESTRIL) 5 MG tablet Take 5 mg by mouth every evening.   Yes Historical Provider, MD  lovastatin (MEVACOR) 40 MG tablet Take 40 mg by mouth at bedtime.   Yes Historical Provider, MD  prasugrel (EFFIENT) 10 MG TABS tablet Take 10 mg by mouth daily.   Yes Historical Provider, MD  Vitamin D, Ergocalciferol, (DRISDOL) 50000 UNITS CAPS capsule Take 50,000 Units by mouth every 7 (seven) days. mondays   Yes Historical Provider, MD  cyclobenzaprine (FLEXERIL) 10 MG tablet Take 1 tablet (10 mg total) by mouth 2 (two) times daily as needed for muscle spasms. 04/03/14   Junius Finner, PA-C  traMADol (ULTRAM) 50 MG tablet  Take 1 tablet (50 mg total) by mouth every 6 (six) hours as needed. 04/03/14   Junius FinnerErin O'Malley, PA-C   BP 125/56 mmHg  Pulse 45  Temp(Src) 97.8 F (36.6 C) (Oral)  Resp 18  SpO2 98% Physical Exam  Constitutional: He appears well-developed and well-nourished.  HENT:  Head: Normocephalic and atraumatic.  Eyes: Conjunctivae are normal. No scleral icterus.  Neck: Normal range of motion.  Cardiovascular: Normal rate, regular rhythm and normal heart sounds.   Pulmonary/Chest: Effort  normal and breath sounds normal. No respiratory distress. He has no wheezes. He has no rales. He exhibits no tenderness.  Abdominal: Soft. Bowel sounds are normal. He exhibits no distension and no mass. There is tenderness (right flank). There is no rebound and no guarding.  Soft, non-distended. Tenderness along the right flank. No CVAT.    Genitourinary:  Chaperoned exam. Tenderness along right groin. No scrotal swelling or tenderness. No hernia or mass palpated.   Musculoskeletal: Normal range of motion.  Neurological: He is alert.  Skin: Skin is warm and dry. No erythema.  Skin in tact. No ecchymosis, erythema, or rash.  Nursing note and vitals reviewed.   ED Course  Procedures (including critical care time) Labs Review Labs Reviewed  URINALYSIS, ROUTINE W REFLEX MICROSCOPIC - Abnormal; Notable for the following:    Bilirubin Urine SMALL (*)    All other components within normal limits  URINE CULTURE  I-STAT CHEM 8, ED    Imaging Review Ct Renal Stone Study  04/03/2014   CLINICAL DATA:  One week history of right flank pain  EXAM: CT ABDOMEN AND PELVIS WITHOUT CONTRAST  TECHNIQUE: Multidetector CT imaging of the abdomen and pelvis was performed following the standard protocol without oral or intravenous contrast material administration.  COMPARISON:  January 19, 2009  FINDINGS: Lung bases are clear.  No focal liver lesions are identified on this noncontrast enhanced study. The gallbladder wall is not thickened. There is no appreciable biliary duct dilatation.  Spleen, pancreas, and adrenals appear normal.  There is again noted a calculus in the mid to lower pole left kidney measuring 1.0 x 0.7 cm. No other calculus is seen on the left. There is no left renal mass or hydronephrosis. There is no left-sided ureteral calculus. On the right, there is a prominent column of Bertin, an anatomic variant. No renal mass is seen. There is no hydronephrosis or calculus on the right. There is no  right-sided ureteral calculus.  In the pelvis, the urinary bladder is largely decompressed. The wall of the urinary bladder is borderline thickened. There is no pelvic mass or fluid. There is fat in the right inguinal ring. There are sigmoid diverticula without diverticulitis. Appendix appears normal.  There is no bowel obstruction. No free air or portal venous air. There are scattered diverticula in the transverse colon without diverticulitis.  There is no ascites, adenopathy, or abscess in the abdomen or pelvis. There is a small ventral hernia containing only fat. There is no demonstrable abdominal aortic aneurysm. There is degenerative change in the lumbar spine. There are no blastic or lytic bone lesions.  IMPRESSION: Stable calculus, nonobstructing 1, in the left kidney. No calculi are identified in the right kidney or ureter. There is no hydronephrosis on either side. An etiology for the patient's right flank pain is uncertain.  There is colonic diverticulosis without diverticulitis. There is a small ventral hernia containing only fat.  Appendix appears normal.  No bowel obstruction.  No abscess.  Electronically Signed   By: Bretta BangWilliam  Woodruff M.D.   On: 04/03/2014 11:51     EKG Interpretation None      MDM   Final diagnoses:  Right flank pain    Pt is a 52yo male presenting to ED with c/o right sided flank pain that started about 1 week ago. Hx of left renal calculus.  No fever, n/v/d. No urinary symptoms. Denies falls or trauma. Pain is reproducible with palpation w/o ecchymosis or erythema. No evidence of shingles.  No scrotal swelling or tenderness. No hernia palpated.   UA: unreamakrable. Chem-8: WNL. CT abd stone study: stable calculus, nonobstructing 1 in left kidney.  No calculi identified in right kidney or ureter. No hydronephrosis. No etiology for pt's right flank pain.  No diverticulitis. Appendix appears normal.  Pt was given IV morphine, fluids and toradol while in ED. Pt states  pain improved from 10/10 to 5/10.    Discussed labs and imaging with pt.  Reassured pt no emergent cause of his pain.  May be due to a strained muscle.  Advised he may be developing shingles as well due to c/o burning sensation.  Advised to f/u with his PCP on Monday, 11/16 as previously scheduled for routine f/u.  Home care instructions provided. Rx: tramadol.  At time of discharge, pt's HR 45, pt was given morphine in ED which likely caused HR to decrease. Pt states he feels well, denies dizziness. States his HR has been low in the past since starting his heart medications. Pt states he feels comfortable being discharged home. Rx: tramadol and flexeril.   Return precautions provided. Pt verbalized understanding and agreement with tx plan.  Discussed pt with Dr. Devoria AlbeIva Knapp, agrees with plan.     Junius FinnerErin O'Malley, PA-C 04/03/14 1249  Ward GivensIva L Knapp, MD 04/03/14 1257

## 2014-04-03 NOTE — Discharge Instructions (Signed)
Flank Pain °Flank pain refers to pain that is located on the side of the body between the upper abdomen and the back. The pain may occur over a short period of time (acute) or may be long-term or reoccurring (chronic). It may be mild or severe. Flank pain can be caused by many things. °CAUSES  °Some of the more common causes of flank pain include: °· Muscle strains.   °· Muscle spasms.   °· A disease of your spine (vertebral disk disease).   °· A lung infection (pneumonia).   °· Fluid around your lungs (pulmonary edema).   °· A kidney infection.   °· Kidney stones.   °· A very painful skin rash caused by the chickenpox virus (shingles).   °· Gallbladder disease.   °HOME CARE INSTRUCTIONS  °Home care will depend on the cause of your pain. In general, °· Rest as directed by your caregiver. °· Drink enough fluids to keep your urine clear or pale yellow. °· Only take over-the-counter or prescription medicines as directed by your caregiver. Some medicines may help relieve the pain. °· Tell your caregiver about any changes in your pain. °· Follow up with your caregiver as directed. °SEEK IMMEDIATE MEDICAL CARE IF:  °· Your pain is not controlled with medicine.   °· You have new or worsening symptoms. °· Your pain increases.   °· You have abdominal pain.   °· You have shortness of breath.   °· You have persistent nausea or vomiting.   °· You have swelling in your abdomen.   °· You feel faint or pass out.   °· You have blood in your urine. °· You have a fever or persistent symptoms for more than 2-3 days. °· You have a fever and your symptoms suddenly get worse. °MAKE SURE YOU:  °· Understand these instructions. °· Will watch your condition. °· Will get help right away if you are not doing well or get worse. °Document Released: 06/29/2005 Document Revised: 01/31/2012 Document Reviewed: 12/21/2011 °ExitCare® Patient Information ©2015 [employer]. This information is not intended to replace advice given to you by your  health care provider. Make sure you discuss any questions you have with your health care provider. ° °

## 2014-04-04 DIAGNOSIS — I2119 ST elevation (STEMI) myocardial infarction involving other coronary artery of inferior wall: Secondary | ICD-10-CM

## 2014-04-04 DIAGNOSIS — I10 Essential (primary) hypertension: Secondary | ICD-10-CM

## 2014-04-04 DIAGNOSIS — E785 Hyperlipidemia, unspecified: Secondary | ICD-10-CM

## 2014-04-04 DIAGNOSIS — I2510 Atherosclerotic heart disease of native coronary artery without angina pectoris: Secondary | ICD-10-CM

## 2014-04-04 LAB — URINE CULTURE: Colony Count: 3000

## 2014-04-06 ENCOUNTER — Ambulatory Visit: Payer: Self-pay | Attending: Internal Medicine | Admitting: Internal Medicine

## 2014-04-06 ENCOUNTER — Encounter: Payer: Self-pay | Admitting: Internal Medicine

## 2014-04-06 VITALS — BP 112/76 | HR 63 | Temp 98.0°F | Resp 16 | Wt 208.2 lb

## 2014-04-06 DIAGNOSIS — R109 Unspecified abdominal pain: Secondary | ICD-10-CM

## 2014-04-06 DIAGNOSIS — M62830 Muscle spasm of back: Secondary | ICD-10-CM

## 2014-04-06 LAB — POCT URINALYSIS DIPSTICK
BILIRUBIN UA: NEGATIVE
GLUCOSE UA: NEGATIVE
Ketones, UA: NEGATIVE
Leukocytes, UA: NEGATIVE
Nitrite, UA: NEGATIVE
Protein, UA: NEGATIVE
RBC UA: NEGATIVE
Spec Grav, UA: 1.01
UROBILINOGEN UA: 1
pH, UA: 6.5

## 2014-04-06 NOTE — Progress Notes (Signed)
Patient here for follow up Was recently seen in the ED for flank pain The pain starts on his right side of his back and extends down his right leg

## 2014-04-06 NOTE — Progress Notes (Signed)
MRN: [id number] Name: Mitchell [name]  Sex: male Age: 53 y.o. DOB: February 04, 1961  Allergies: Review of patient's allergies indicates no known allergies.  Chief Complaint  Patient presents with  . Follow-up    HPI: Patient is 53 y.o. male who Patient has history of MI, CAD, hypertension, hyperlipidemia, recently went to the emergency room with symptoms of right-sided lower back pain radiating to the groin, EMR reviewed, patient had CT abdomen done reported to have nonobstructive left kidney calculus, no right kidney or ureter stones, no hydronephrosis, colonic diverticulosis without diverticulitis, no bowel obstruction or abscess, patient was prescribed muscle relaxant and tramadol as per patient there is improvement in the symptoms, today's urine is negative for any infection.  Past Medical History  Diagnosis Date  . Hyperlipidemia   . CAD (coronary artery disease)     a. 02/2014 Inf STEMI/PCI: LM nl, LAD min irregs, Diags 1/2/3 min irregs, LCX min irregs, OM1/2/3 min irregs, RI min irregs, RCA 100p (3.5x18 Xience DES), PDA/PL nl, EF 45-50% basal inf HK.  Marland Kitchen. NSVT (nonsustained ventricular tachycardia)     a. in the setting of inferior STEMI;  b. 02/2014 Echo: EF 55-60%, Gr 1 DD, basal-midinferolateral/inf HK, Gr 1 DD, triv TR.    Past Surgical History  Procedure Laterality Date  . Cardiac catheterization      02/28/2014 prox RCA single vessel occlusion treated with DES      Medication List       This list is accurate as of: 04/06/14 11:10 AM.  Always use your most recent med list.               ALPRAZolam 0.5 MG tablet  Commonly known as:  XANAX  Take 1 tablet (0.5 mg total) by mouth 2 (two) times daily as needed for anxiety.     aspirin 81 MG chewable tablet  Chew 1 tablet (81 mg total) by mouth daily.     carvedilol 6.25 MG tablet  Commonly known as:  COREG  Take 1 tablet (6.25 mg total) by mouth 2 (two) times daily with a meal.     lisinopril 5 MG tablet    Commonly known as:  PRINIVIL,ZESTRIL  Take 1 tablet (5 mg total) by mouth daily.     lovastatin 40 MG tablet  Commonly known as:  MEVACOR  Take 1 tablet (40 mg total) by mouth at bedtime.     magnesium hydroxide 400 MG/5ML suspension  Commonly known as:  MILK OF MAGNESIA  Take 5 mLs by mouth daily as needed for mild constipation.     nitroGLYCERIN 0.4 MG SL tablet  Commonly known as:  NITROSTAT  Place 1 tablet (0.4 mg total) under the tongue every 5 (five) minutes as needed for chest pain.     prasugrel 10 MG Tabs tablet  Commonly known as:  EFFIENT  Take 1 tablet (10 mg total) by mouth at bedtime.     Vitamin D (Ergocalciferol) 50000 UNITS Caps capsule  Commonly known as:  DRISDOL  Take 1 capsule (50,000 Units total) by mouth every 7 (seven) days.        No orders of the defined types were placed in this encounter.     There is no immunization history on file for this patient.  Family History  Problem Relation Age of Onset  . Heart attack Brother 51  . Hypertension Father     History  Substance Use Topics  . Smoking status: Never Smoker   . Smokeless  tobacco: Not on file  . Alcohol Use: No    Review of Systems   As noted in HPI  Filed Vitals:   04/06/14 1028  BP: 112/76  Pulse: 63  Temp: 98 F (36.7 C)  Resp: 16    Physical Exam  Physical Exam  Constitutional: No distress.  Eyes: EOM are normal. Pupils are equal, round, and reactive to light.  Cardiovascular: Normal rate and regular rhythm.   Pulmonary/Chest: Breath sounds normal. No respiratory distress. He has no wheezes. He has no rales.  Musculoskeletal: He exhibits no edema.  With SLR on the right  patient complains of back discomfort on shooting pain in the leg     CBC    Component Value Date/Time   WBC 7.1 03/06/2014 0305   RBC 4.05* 03/06/2014 0305   HGB 13.3 03/06/2014 0305   HCT 37.3* 03/06/2014 0305   PLT 205 03/06/2014 0305   MCV 92.1 03/06/2014 0305   LYMPHSABS 1.5  03/04/2014 1155   MONOABS 0.6 03/04/2014 1155   EOSABS 0.2 03/04/2014 1155   BASOSABS 0.0 03/04/2014 1155    CMP     Component Value Date/Time   NA 137 03/11/2014 1531   K 5.6* 03/11/2014 1531   CL 105 03/11/2014 1531   CO2 19 03/11/2014 1531   GLUCOSE 90 03/11/2014 1531   BUN 15 03/11/2014 1531   CREATININE 0.83 03/11/2014 1531   CREATININE 1.07 03/05/2014 0420   CALCIUM 9.1 03/11/2014 1531   PROT 7.2 03/11/2014 1531   ALBUMIN 4.5 03/11/2014 1531   AST 37 03/11/2014 1531   ALT 21 03/11/2014 1531   ALKPHOS 91 03/11/2014 1531   BILITOT 0.6 03/11/2014 1531   GFRNONAA >89 03/11/2014 1531   GFRNONAA 77* 03/05/2014 0420   GFRAA >89 03/11/2014 1531   GFRAA 90* 03/05/2014 0420    Lab Results  Component Value Date/Time   CHOL 140 03/05/2014 04:20 AM    No components found for: HGA1C  Lab Results  Component Value Date/Time   AST 37 03/11/2014 03:31 PM    Assessment and Plan  Flank pain - Plan:  Results for orders placed or performed in visit on 04/06/14  Urinalysis Dipstick  Result Value Ref Range   Color, UA yellow    Clarity, UA clear    Glucose, UA neg    Bilirubin, UA neg    Ketones, UA neg    Spec Grav, UA 1.010    Blood, UA neg    pH, UA 6.5    Protein, UA neg    Urobilinogen, UA 1.0    Nitrite, UA neg    Leukocytes, UA Negative    Urinalysis DipstickIs negative for infection, recent CT scan also negative most likely muscular pain patient will continue with muscle relaxant and tramadol when necessary  Back muscle spasm Advised patient to apply heating pad.  Patient's ER visit is documented in  the Different MR# MRN:  [id number]   Health Maintenance Patient declines flu shot   Follow up as scheduled.  Doris CheadleADVANI, Johnetta Sloniker, MD

## 2014-04-10 ENCOUNTER — Emergency Department (HOSPITAL_COMMUNITY)
Admission: EM | Admit: 2014-04-10 | Discharge: 2014-04-10 | Disposition: A | Discharge status: Home / Self Care | Payer: Self-pay | Attending: Emergency Medicine | Admitting: Emergency Medicine

## 2014-04-10 ENCOUNTER — Emergency Department (HOSPITAL_COMMUNITY): Payer: Self-pay

## 2014-04-10 ENCOUNTER — Encounter (HOSPITAL_COMMUNITY): Payer: Self-pay | Admitting: Nurse Practitioner

## 2014-04-10 DIAGNOSIS — Z7982 Long term (current) use of aspirin: Secondary | ICD-10-CM | POA: Insufficient documentation

## 2014-04-10 DIAGNOSIS — R202 Paresthesia of skin: Secondary | ICD-10-CM | POA: Insufficient documentation

## 2014-04-10 DIAGNOSIS — I10 Essential (primary) hypertension: Secondary | ICD-10-CM | POA: Insufficient documentation

## 2014-04-10 DIAGNOSIS — R1031 Right lower quadrant pain: Secondary | ICD-10-CM | POA: Insufficient documentation

## 2014-04-10 DIAGNOSIS — Z79899 Other long term (current) drug therapy: Secondary | ICD-10-CM | POA: Insufficient documentation

## 2014-04-10 DIAGNOSIS — Z9861 Coronary angioplasty status: Secondary | ICD-10-CM | POA: Insufficient documentation

## 2014-04-10 DIAGNOSIS — I2510 Atherosclerotic heart disease of native coronary artery without angina pectoris: Secondary | ICD-10-CM | POA: Insufficient documentation

## 2014-04-10 DIAGNOSIS — E785 Hyperlipidemia, unspecified: Secondary | ICD-10-CM | POA: Insufficient documentation

## 2014-04-10 DIAGNOSIS — I252 Old myocardial infarction: Secondary | ICD-10-CM | POA: Insufficient documentation

## 2014-04-10 LAB — BASIC METABOLIC PANEL
ANION GAP: 14 (ref 5–15)
BUN: 17 mg/dL (ref 6–23)
CALCIUM: 9.4 mg/dL (ref 8.4–10.5)
CHLORIDE: 101 meq/L (ref 96–112)
CO2: 22 mEq/L (ref 19–32)
CREATININE: 0.88 mg/dL (ref 0.50–1.35)
GFR calc Af Amer: >90 mL/min (ref >90)
GFR calc non Af Amer: >90 mL/min (ref >90)
GLUCOSE: 95 mg/dL (ref 70–99)
POTASSIUM: 4.4 meq/L (ref 3.7–5.3)
SODIUM: 137 meq/L (ref 137–147)

## 2014-04-10 LAB — CBC
HEMATOCRIT: 39.4 % (ref 39.0–52.0)
HEMOGLOBIN: 13.9 g/dL (ref 13.0–17.0)
MCH: 32.1 pg (ref 26.0–34.0)
MCHC: 35.3 g/dL (ref 30.0–36.0)
MCV: 91 fL (ref 78.0–100.0)
Platelets: 222 10*3/uL (ref 150–400)
RBC: 4.33 MIL/uL (ref 4.22–5.81)
RDW: 11.9 % (ref 11.5–15.5)
WBC: 5 10*3/uL (ref 4.0–10.5)

## 2014-04-10 LAB — PRO B NATRIURETIC PEPTIDE: Pro B Natriuretic peptide (BNP): 70.8 pg/mL (ref 0–125)

## 2014-04-10 LAB — I-STAT TROPONIN, ED: TROPONIN I, POC: 0 ng/mL (ref 0.00–0.08)

## 2014-04-10 MED ORDER — SODIUM CHLORIDE 0.9 % IV BOLUS (SEPSIS)
1000.0000 mL | Freq: Once | INTRAVENOUS | Status: AC
  Administered 2014-04-10: 1000 mL via INTRAVENOUS

## 2014-04-10 NOTE — ED Notes (Signed)
Pt reports numbness and tingling in his face, lips and L arm x 2 days. hes also noticed some SOB on exertion and lightheadedness when standing. He was here in early October for MI. His only c/o pain is in his R hip,he was seen here last week for that and given pain medications.

## 2014-04-10 NOTE — ED Notes (Signed)
Patient states the numbness has been ongoing x 2 days.  Patient states he is numb in his whole face (forehead, roof of mouth, tongue), feels like "novacane wearing off".  Feels more lightheaded than normal.  Patient has decreased weakness to the left arm comparable to the right.  No ataxia or drift noted.

## 2014-04-10 NOTE — Discharge Instructions (Signed)
Return here as needed. Follow up with your doctor for further evaluation of this issue. Your CT scan was normal. This could be related to the Ultram as well.

## 2014-04-10 NOTE — ED Provider Notes (Signed)
CSN: [id number]     Arrival date & time 04/10/14  1114 History   First MD Initiated Contact with Patient 04/10/14 1146     Chief Complaint  Patient presents with  . Numbness    HPI:  Mitchell [name] is a 53 year old Caucasian male with PMH of HTN, HLD, and CAD (STEMI 02/28/2014 of proximal RCA with DES) presenting to the ED on 04/10/2014 for numbness and tingling along his face and left arm. Patient says he first started experiencing numbness along his left arm and down to his fingers 3 days ago. He says the numbness and tingling in his face began the following day and is present along his forehead, cheeks, nose, lips, chin, and his tongue. He describes the sensation as feeling like "novocaine wearing off" following a dental procedure. He says the sensation has been constant since it began and nothing makes it better or worse. The patient reports "Kissing Bugs" were found in his house this past month by an exterminator and he is worried he might have been exposed to Chagas Disease. He denies any recent illnesses or recent travel.  The patient was seen in the ED on 04/03/2014 for right flank and groin pain and was treated for a MSK injury with Flexeril and Tramadol. He has been taking the medications regularly and says they have successfully helped with his pain, but he stopped them yesterday due to thinking they might be causing his symptoms since those are new medications to him. However, he says the symptoms have not changed since stopping the medications. He says he has experienced some lightheadedness recently, which is worse with standing or taking deep breaths. He also endorses shortness of breath with exertion but says it has been present since his STEMI in October.  He denies any headaches, syncope, blurry vision, floaters, changes in taste, difficulty with speech, chest pain, palpitations, nausea, vomiting, diarrhea, or urinary changes. He reports having constipation (LBM was 4 days ago) and  having persistent right groin pain (improved since last week).   Past Medical History  Diagnosis Date  . MI (myocardial infarction)   . Coronary artery disease    Past Surgical History  Procedure Laterality Date  . Coronary angioplasty with stent placement     History reviewed. No pertinent family history. History  Substance Use Topics  . Smoking status: Not on file  . Smokeless tobacco: Not on file  . Alcohol Use: No    Review of Systems: All other systems negative except as documented in the HPI. All pertinent positives and negatives as reviewed in the HPI.   Allergies  Review of patient's allergies indicates no known allergies.  Home Medications   Prior to Admission medications   Medication Sig Start Date End Date Taking? Authorizing Provider  aspirin EC 81 MG tablet Take 81 mg by mouth daily.   Yes Historical Provider, MD  carvedilol (COREG) 6.25 MG tablet Take 6.25 mg by mouth 2 (two) times daily with a meal.   Yes Historical Provider, MD  cyclobenzaprine (FLEXERIL) 10 MG tablet Take 1 tablet (10 mg total) by mouth 2 (two) times daily as needed for muscle spasms. 04/03/14  Yes Junius FinnerErin O'Malley, PA-C  lisinopril (PRINIVIL,ZESTRIL) 5 MG tablet Take 5 mg by mouth every evening.   Yes Historical Provider, MD  lovastatin (MEVACOR) 40 MG tablet Take 40 mg by mouth at bedtime.   Yes Historical Provider, MD  prasugrel (EFFIENT) 10 MG TABS tablet Take 10 mg by mouth daily.  Yes Historical Provider, MD  traMADol (ULTRAM) 50 MG tablet Take 1 tablet (50 mg total) by mouth every 6 (six) hours as needed. 04/03/14  Yes Junius FinnerErin O'Malley, PA-C  Vitamin D, Ergocalciferol, (DRISDOL) 50000 UNITS CAPS capsule Take 50,000 Units by mouth every 7 (seven) days. mondays   Yes Historical Provider, MD  NITROSTAT 0.4 MG SL tablet  03/06/14   Historical Provider, MD   BP 123/81 mmHg  Pulse 63  Temp(Src) 97.8 F (36.6 C) (Oral)  Resp 16  SpO2 99% Physical Exam  Constitutional: He is oriented to  person, place, and time. He appears well-developed and well-nourished.  HENT:  Head: Normocephalic and atraumatic.  Mouth/Throat: Oropharynx is clear and moist. No oropharyngeal exudate.  Eyes: Conjunctivae and EOM are normal. Pupils are equal, round, and reactive to light. Right eye exhibits no discharge. Left eye exhibits no discharge. No scleral icterus.  Neck: Neck supple.  Cardiovascular: Normal rate, regular rhythm, normal heart sounds and intact distal pulses.  Exam reveals no gallop and no friction rub.   No murmur heard. Pulmonary/Chest: Effort normal and breath sounds normal. No stridor. No respiratory distress. He has no wheezes. He has no rales. He exhibits no tenderness.  Abdominal: Soft. Bowel sounds are normal. He exhibits no distension and no mass. There is tenderness. There is no rebound and no guarding.  Tender to palpation along right lower quadrant.  Musculoskeletal: Normal range of motion. He exhibits no edema or tenderness.  Lymphadenopathy:    He has no cervical adenopathy.  Neurological: He is alert and oriented to person, place, and time. He has normal reflexes. No cranial nerve deficit. Coordination normal.  Sensation intact along facial area and all extremities. Pronator drift negative. Finger to nose and rapid alternating movement testing negative. Muscular strength 4/5 in right upper extremity. Muscular strength 5/5 in left upper extremity.  Skin: Skin is warm and dry. No rash noted. No erythema. No pallor.  Nursing note and vitals reviewed.   ED Course  Procedures (including critical care time) Labs Review Labs Reviewed  CBC  BASIC METABOLIC PANEL  PRO B NATRIURETIC PEPTIDE  I-STAT TROPOININ, ED    Imaging Review No results found.   EKG Interpretation   Date/Time:  Friday April 10 2014 11:21:29 EST Ventricular Rate:  77 PR Interval:  194 QRS Duration: 102 QT Interval:  366 QTC Calculation: 414 R Axis:   -74 Text Interpretation:  Normal sinus  rhythm Left anterior fascicular block  Possible Lateral infarct , age undetermined Cannot rule out Inferior  infarct (masked by fascicular block?) , age undetermined , new T wave  inversion Inferior leads Confirmed by Anitra LauthPLUNKETT  MD, Alphonzo LemmingsWHITNEY ([id number]) on  04/10/2014 12:36:05 PM      MDM   Final diagnoses:  Tingling   Advised the patient that this could be some other type of neurologic condition, other than a stroke and that he will need further testing by a primary care doctor also checked for Lyme disease.  The patient was worried about Chagas disease as he states that a pest control company and are found a kissing bug in his house.       Carlyle DollyChristopher W Britain Anagnos, PA-C 04/15/14 [id number]  Gwyneth SproutWhitney Plunkett, MD 04/19/14 2256

## 2014-04-13 LAB — B. BURGDORFI ANTIBODIES: B burgdorferi Ab IgG+IgM: 0.53 {ISR}

## 2014-04-15 ENCOUNTER — Encounter (HOSPITAL_COMMUNITY): Payer: Self-pay | Admitting: Nurse Practitioner

## 2014-04-20 ENCOUNTER — Encounter: Payer: Self-pay | Admitting: Internal Medicine

## 2014-04-23 ENCOUNTER — Telehealth: Payer: Self-pay | Admitting: Cardiology

## 2014-04-23 ENCOUNTER — Other Ambulatory Visit: Payer: Self-pay

## 2014-04-23 ENCOUNTER — Other Ambulatory Visit (INDEPENDENT_AMBULATORY_CARE_PROVIDER_SITE_OTHER): Payer: Self-pay | Admitting: *Deleted

## 2014-04-23 DIAGNOSIS — E785 Hyperlipidemia, unspecified: Secondary | ICD-10-CM

## 2014-04-23 LAB — HEPATIC FUNCTION PANEL
ALT: 22 U/L (ref 0–53)
AST: 24 U/L (ref 0–37)
Albumin: 4.1 g/dL (ref 3.5–5.2)
Alkaline Phosphatase: 89 U/L (ref 39–117)
Bilirubin, Direct: 0 mg/dL (ref 0.0–0.3)
Total Bilirubin: 0.7 mg/dL (ref 0.2–1.2)
Total Protein: 6.9 g/dL (ref 6.0–8.3)

## 2014-04-23 LAB — LIPID PANEL
CHOL/HDL RATIO: 3
Cholesterol: 131 mg/dL (ref 0–200)
HDL: 39.8 mg/dL (ref >39.00)
LDL Cholesterol: 55 mg/dL (ref 0–99)
NonHDL: 91.2
TRIGLYCERIDES: 182 mg/dL — AB (ref 0.0–149.0)
VLDL: 36.4 mg/dL (ref 0.0–40.0)

## 2014-04-23 NOTE — Telephone Encounter (Signed)
Walk In pt Form " [name] Assistance" paper Dropped Off gave to Anne/KM

## 2014-04-30 ENCOUNTER — Encounter (HOSPITAL_COMMUNITY): Payer: Self-pay | Admitting: Cardiovascular Disease

## 2014-04-30 ENCOUNTER — Telehealth: Payer: Self-pay | Admitting: *Deleted

## 2014-04-30 NOTE — Telephone Encounter (Signed)
Patient approved for Effient 04/30/2014-05/01/2015 through [employer] [facility].

## 2014-05-04 ENCOUNTER — Telehealth: Payer: Self-pay | Admitting: Cardiology

## 2014-05-04 NOTE — Telephone Encounter (Signed)
Follow Up ° °Pt returning call from earlier. Please call. °

## 2014-05-04 NOTE — Telephone Encounter (Signed)
Mailbox is full and cannot accept messages. Forward to [place].

## 2014-05-04 NOTE — Telephone Encounter (Signed)
Patient states he is need of Effient 10 mg. Almost out.  i believe he is referring to the Patient assistance program. Will check for samples. Have 3 bottles for patient #21 tablets. Informed of this.

## 2014-05-04 NOTE — Telephone Encounter (Signed)
New Prob   Pt is calling to follow up on paperwork  To get refills without charge. Please call.

## 2014-05-06 ENCOUNTER — Telehealth: Payer: Self-pay | Admitting: *Deleted

## 2014-05-06 NOTE — Telephone Encounter (Signed)
Patient is aware that his application for Effient was approved and that we would contact him when the shipment gets here. I also advised him to let us know when he is about half way through the third month so we can reorder for him, he verbalized his understanding.

## 2014-05-06 NOTE — Telephone Encounter (Signed)
Does the patient know he has been approved?

## 2014-05-11 ENCOUNTER — Telehealth: Payer: Self-pay | Admitting: *Deleted

## 2014-05-11 NOTE — Telephone Encounter (Signed)
Called patient to let him know that his effient had arrived from the company.

## 2014-06-04 ENCOUNTER — Other Ambulatory Visit: Payer: Self-pay | Admitting: Internal Medicine

## 2014-06-22 ENCOUNTER — Encounter (HOSPITAL_COMMUNITY): Payer: Self-pay | Admitting: Neurology

## 2014-06-22 ENCOUNTER — Emergency Department (HOSPITAL_COMMUNITY)
Admission: EM | Admit: 2014-06-22 | Discharge: 2014-06-22 | Discharge status: Against Medical Advice | Payer: Self-pay | Attending: Emergency Medicine | Admitting: Emergency Medicine

## 2014-06-22 ENCOUNTER — Emergency Department (HOSPITAL_COMMUNITY): Payer: Self-pay

## 2014-06-22 DIAGNOSIS — Z9861 Coronary angioplasty status: Secondary | ICD-10-CM | POA: Insufficient documentation

## 2014-06-22 DIAGNOSIS — R42 Dizziness and giddiness: Secondary | ICD-10-CM | POA: Insufficient documentation

## 2014-06-22 DIAGNOSIS — Z7982 Long term (current) use of aspirin: Secondary | ICD-10-CM | POA: Insufficient documentation

## 2014-06-22 DIAGNOSIS — I252 Old myocardial infarction: Secondary | ICD-10-CM | POA: Insufficient documentation

## 2014-06-22 DIAGNOSIS — I2510 Atherosclerotic heart disease of native coronary artery without angina pectoris: Secondary | ICD-10-CM | POA: Insufficient documentation

## 2014-06-22 DIAGNOSIS — R61 Generalized hyperhidrosis: Secondary | ICD-10-CM | POA: Insufficient documentation

## 2014-06-22 DIAGNOSIS — R0602 Shortness of breath: Secondary | ICD-10-CM | POA: Insufficient documentation

## 2014-06-22 DIAGNOSIS — R0789 Other chest pain: Secondary | ICD-10-CM | POA: Insufficient documentation

## 2014-06-22 DIAGNOSIS — Z9889 Other specified postprocedural states: Secondary | ICD-10-CM | POA: Insufficient documentation

## 2014-06-22 DIAGNOSIS — Z79899 Other long term (current) drug therapy: Secondary | ICD-10-CM | POA: Insufficient documentation

## 2014-06-22 LAB — BASIC METABOLIC PANEL
Anion gap: 7 (ref 5–15)
BUN: 17 mg/dL (ref 6–23)
CHLORIDE: 106 mmol/L (ref 96–112)
CO2: 24 mmol/L (ref 19–32)
CREATININE: 0.86 mg/dL (ref 0.50–1.35)
Calcium: 9.3 mg/dL (ref 8.4–10.5)
Glucose, Bld: 96 mg/dL (ref 70–99)
Potassium: 4.3 mmol/L (ref 3.5–5.1)
Sodium: 137 mmol/L (ref 135–145)

## 2014-06-22 LAB — CBC
HCT: 43.6 % (ref 39.0–52.0)
HEMOGLOBIN: 15.5 g/dL (ref 13.0–17.0)
MCH: 33.8 pg (ref 26.0–34.0)
MCHC: 35.6 g/dL (ref 30.0–36.0)
MCV: 95.2 fL (ref 78.0–100.0)
Platelets: 217 10*3/uL (ref 150–400)
RBC: 4.58 MIL/uL (ref 4.22–5.81)
RDW: 12.4 % (ref 11.5–15.5)
WBC: 5 10*3/uL (ref 4.0–10.5)

## 2014-06-22 LAB — I-STAT TROPONIN, ED: Troponin i, poc: 0 ng/mL (ref 0.00–0.08)

## 2014-06-22 NOTE — ED Notes (Signed)
Pt refuses to be hooked up to the monitor and refuses further tx.

## 2014-06-22 NOTE — ED Notes (Signed)
Pt reports chest pressure, dizziness since this morning. 2 weeks ago stopped taking BP meds due to finances. Reports 1 stent. Also has been having palpations.

## 2014-06-22 NOTE — ED Notes (Signed)
Pt refuses further tx and check of VS upon d/c. Pt leaves AMA.

## 2014-06-22 NOTE — ED Provider Notes (Signed)
CSN: [id number]     Arrival date & time 06/22/14  1157 History   First MD Initiated Contact with Patient 06/22/14 1218     Chief Complaint  Patient presents with  . Chest Pain  . Dizziness     (Consider location/radiation/quality/duration/timing/severity/associated sxs/prior Treatment) HPI Comments: 54 year old male with a past medical history of CAD s/p cath with stent placement 02/28/2014, MI and NSVT presenting to the ED complaining of 5 days of midsternal chest pressure with associated lightheadedness and diaphoresis beginning this morning. States he feels short of breath, both at rest and on exertion. He had an MI in October 2015, states at that time he is experiencing both chest pain and pressure. The pressure sensation is rated 5/10, decreasing to 1/10 on arrival to the ED without any intervention. He took 81 mg aspirin this morning. Admits to slight nausea without vomiting. States he has been off of his carvedilol, lisinopril and lovastatin for 2 weeks since he cannot afford them. He is still taking Effient daily. Cardiologist Dr. Shirlee Latch.  Patient is a 54 y.o. male presenting with chest pain and dizziness. The history is provided by the patient.  Chest Pain Associated symptoms: diaphoresis, dizziness and shortness of breath   Dizziness Associated symptoms: chest pain and shortness of breath     Past Medical History  Diagnosis Date  . Hyperlipidemia   . CAD (coronary artery disease)     a. 02/2014 Inf STEMI/PCI: LM nl, LAD min irregs, Diags 1/2/3 min irregs, LCX min irregs, OM1/2/3 min irregs, RI min irregs, RCA 100p (3.5x18 Xience DES), PDA/PL nl, EF 45-50% basal inf HK.  Marland Kitchen NSVT (nonsustained ventricular tachycardia)     a. in the setting of inferior STEMI;  b. 02/2014 Echo: EF 55-60%, Gr 1 DD, basal-midinferolateral/inf HK, Gr 1 DD, triv TR.  . MI (myocardial infarction)   . Coronary artery disease    Past Surgical History  Procedure Laterality Date  . Cardiac catheterization       02/28/2014 prox RCA single vessel occlusion treated with DES  . Coronary angioplasty with stent placement    . Left heart cath Bilateral 02/28/2014    Procedure: LEFT HEART CATH;  Surgeon: Iran Ouch, MD;  Location: [facility] CATH LAB;  Service: Cardiovascular;  Laterality: Bilateral;   Family History  Problem Relation Age of Onset  . Heart attack Brother 51  . Hypertension Father    History  Substance Use Topics  . Smoking status: Former Games developer  . Smokeless tobacco: Not on file  . Alcohol Use: Yes    Review of Systems  Constitutional: Positive for diaphoresis.  Respiratory: Positive for shortness of breath.   Cardiovascular: Positive for chest pain.  Neurological: Positive for dizziness and light-headedness.  All other systems reviewed and are negative.     Allergies  Review of patient's allergies indicates no known allergies.  Home Medications   Prior to Admission medications   Medication Sig Start Date End Date Taking? Authorizing Provider  ALPRAZolam Prudy Feeler) 0.5 MG tablet Take 1 tablet (0.5 mg total) by mouth 2 (two) times daily as needed for anxiety. 03/11/14  Yes Doris Cheadle, MD  aspirin 81 MG chewable tablet Chew 1 tablet (81 mg total) by mouth daily. 03/01/14  Yes Jodelle Gross, NP  cyclobenzaprine (FLEXERIL) 10 MG tablet Take 1 tablet (10 mg total) by mouth 2 (two) times daily as needed for muscle spasms. 04/03/14  Yes Junius Finner, PA-C  prasugrel (EFFIENT) 10 MG TABS tablet Take 1 tablet (  10 mg total) by mouth at bedtime. Patient taking differently: Take 10 mg by mouth daily.  03/06/14  Yes Ok Anis, NP  traMADol (ULTRAM) 50 MG tablet Take 1 tablet (50 mg total) by mouth every 6 (six) hours as needed. 04/03/14  Yes Junius Finner, PA-C  Vitamin D, Ergocalciferol, (DRISDOL) 50000 UNITS CAPS capsule Take 1 capsule (50,000 Units total) by mouth every 7 (seven) days. 03/16/14  Yes Doris Cheadle, MD  aspirin EC 81 MG tablet Take 81 mg by mouth  daily.    Historical Provider, MD  carvedilol (COREG) 6.25 MG tablet Take 6.25 mg by mouth 2 (two) times daily with a meal.    Historical Provider, MD  carvedilol (COREG) 6.25 MG tablet Take 1 tablet (6.25 mg total) by mouth 2 (two) times daily with a meal. 03/01/14   Jodelle Gross, NP  lisinopril (PRINIVIL,ZESTRIL) 5 MG tablet Take 5 mg by mouth every evening.    Historical Provider, MD  lisinopril (PRINIVIL,ZESTRIL) 5 MG tablet Take 1 tablet (5 mg total) by mouth daily. 03/01/14   Jodelle Gross, NP  lovastatin (MEVACOR) 40 MG tablet Take 40 mg by mouth at bedtime.    Historical Provider, MD  lovastatin (MEVACOR) 40 MG tablet Take 1 tablet (40 mg total) by mouth at bedtime. 03/01/14   Jodelle Gross, NP  magnesium hydroxide (MILK OF MAGNESIA) 400 MG/5ML suspension Take 5 mLs by mouth daily as needed for mild constipation. 03/11/14   Doris Cheadle, MD  nitroGLYCERIN (NITROSTAT) 0.4 MG SL tablet Place 1 tablet (0.4 mg total) under the tongue every 5 (five) minutes as needed for chest pain. 03/06/14   Ok Anis, NP  NITROSTAT 0.4 MG SL tablet  03/06/14   Historical Provider, MD  prasugrel (EFFIENT) 10 MG TABS tablet Take 10 mg by mouth daily.    Historical Provider, MD  Vitamin D, Ergocalciferol, (DRISDOL) 50000 UNITS CAPS capsule Take 50,000 Units by mouth every 7 (seven) days. mondays    Historical Provider, MD   BP 142/96 mmHg  Pulse 62  Temp(Src) 97.6 F (36.4 C) (Oral)  Resp 13  SpO2 98% Physical Exam  Constitutional: He is oriented to person, place, and time. He appears well-developed and well-nourished. No distress.  HENT:  Head: Normocephalic and atraumatic.  Mouth/Throat: Oropharynx is clear and moist.  Eyes: Conjunctivae and EOM are normal. Pupils are equal, round, and reactive to light.  Neck: Normal range of motion. Neck supple. No JVD present.  Cardiovascular: Normal rate, regular rhythm, normal heart sounds and intact distal pulses.   No extremity edema.   Pulmonary/Chest: Effort normal and breath sounds normal. No respiratory distress.  Abdominal: Soft. Bowel sounds are normal. There is no tenderness.  Musculoskeletal: Normal range of motion. He exhibits no edema.  Neurological: He is alert and oriented to person, place, and time. He has normal strength. No sensory deficit.  Speech fluent, goal oriented. Moves limbs without ataxia. Equal grip strength bilateral.  Skin: Skin is warm and dry. He is not diaphoretic.  Psychiatric: He has a normal mood and affect. His behavior is normal.  Nursing note and vitals reviewed.   ED Course  Procedures (including critical care time) Labs Review Labs Reviewed  CBC  BASIC METABOLIC PANEL  I-STAT TROPOININ, ED    Imaging Review Dg Chest 2 View  06/22/2014   CLINICAL DATA:  Chest pain, dizziness and shortness of breath for 3 days.  EXAM: CHEST  2 VIEW  COMPARISON:  PA and  lateral chest 04/10/2014.  FINDINGS: Heart size and mediastinal contours are within normal limits. Both lungs are clear. Visualized skeletal structures are unremarkable.  IMPRESSION: Negative exam.   Electronically Signed   By: Drusilla Kannerhomas  Dalessio M.D.   On: 06/22/2014 13:31     EKG Interpretation   Date/Time:  Monday June 22 2014 12:05:17 EST Ventricular Rate:  63 PR Interval:  192 QRS Duration: 104 QT Interval:  388 QTC Calculation: 397 R Axis:   -67 Text Interpretation:  Normal sinus rhythm Left anterior fascicular block  Abnormal ECG since last tracing no significant change Confirmed by Effie ShyWENTZ   MD, Mechele CollinELLIOTT ([id number]) on 06/22/2014 12:28:04 PM      MDM   Final diagnoses:  Chest pressure   Patient in no apparent distress. Afebrile, vital signs stable. Significant cardiac history. Labs, chest x-ray without acute finding. No acute EKG changes. A, the pressure was accompanied by lightheadedness, nausea and diaphoresis. Recent cardiac cath in October 2015 with significant stenosis. Despite negative workup, I suggested  evaluation by cardiology. Patient initially agreeable, however while waiting for the callback from cardiology, patient was stating that he is concerned about his car being parked in the wrong place. He states he would like to leave, he did try to move his car, but is not sure if he moved to the right place. I discussed that patient would need to leave AGAINST MEDICAL ADVICE since patient needs a cardiology consult. Patient aware that leaving AGAINST MEDICAL ADVICE could result in worsening symptoms and possibly death. Patient agreeable to this, walked out of the ED prior to nursing being able to take repeat vital signs which he refused.  Discussed with attending Dr. Effie ShyWentz who agrees with plan of care.   Kathrynn SpeedRobyn M Estrellita Lasky, PA-C 06/22/14 1416  Flint MelterElliott L Wentz, MD 06/22/14 [phone]

## 2014-06-22 NOTE — Discharge Instructions (Signed)
You are leaving AGAINST MEDICAL ADVICE. It was suggested today that you had an evaluation from cardiology regarding your chest pressure and your history of cardiac issues. Please return to the emergency department immediately with any worsening symptoms.  Chest Pain (Nonspecific) It is often hard to give a specific diagnosis for the cause of chest pain. There is always a chance that your pain could be related to something serious, such as a heart attack or a blood clot in the lungs. You need to follow up with your health care provider for further evaluation. CAUSES   Heartburn.  Pneumonia or bronchitis.  Anxiety or stress.  Inflammation around your heart (pericarditis) or lung (pleuritis or pleurisy).  A blood clot in the lung.  A collapsed lung (pneumothorax). It can develop suddenly on its own (spontaneous pneumothorax) or from trauma to the chest.  Shingles infection (herpes zoster virus). The chest wall is composed of bones, muscles, and cartilage. Any of these can be the source of the pain.  The bones can be bruised by injury.  The muscles or cartilage can be strained by coughing or overwork.  The cartilage can be affected by inflammation and become sore (costochondritis). DIAGNOSIS  Lab tests or other studies may be needed to find the cause of your pain. Your health care provider may have you take a test called an ambulatory electrocardiogram (ECG). An ECG records your heartbeat patterns over a 24-hour period. You may also have other tests, such as:  Transthoracic echocardiogram (TTE). During echocardiography, sound waves are used to evaluate how blood flows through your heart.  Transesophageal echocardiogram (TEE).  Cardiac monitoring. This allows your health care provider to monitor your heart rate and rhythm in real time.  Holter monitor. This is a portable device that records your heartbeat and can help diagnose heart arrhythmias. It allows your health care provider to  track your heart activity for several days, if needed.  Stress tests by exercise or by giving medicine that makes the heart beat faster. TREATMENT   Treatment depends on what may be causing your chest pain. Treatment may include:  Acid blockers for heartburn.  Anti-inflammatory medicine.  Pain medicine for inflammatory conditions.  Antibiotics if an infection is present.  You may be advised to change lifestyle habits. This includes stopping smoking and avoiding alcohol, caffeine, and chocolate.  You may be advised to keep your head raised (elevated) when sleeping. This reduces the chance of acid going backward from your stomach into your esophagus. Most of the time, nonspecific chest pain will improve within 2-3 days with rest and mild pain medicine.  HOME CARE INSTRUCTIONS   If antibiotics were prescribed, take them as directed. Finish them even if you start to feel better.  For the next few days, avoid physical activities that bring on chest pain. Continue physical activities as directed.  Do not use any tobacco products, including cigarettes, chewing tobacco, or electronic cigarettes.  Avoid drinking alcohol.  Only take medicine as directed by your health care provider.  Follow your health care provider's suggestions for further testing if your chest pain does not go away.  Keep any follow-up appointments you made. If you do not go to an appointment, you could develop lasting (chronic) problems with pain. If there is any problem keeping an appointment, call to reschedule. SEEK MEDICAL CARE IF:   Your chest pain does not go away, even after treatment.  You have a rash with blisters on your chest.  You have a fever.  SEEK IMMEDIATE MEDICAL CARE IF:   You have increased chest pain or pain that spreads to your arm, neck, jaw, back, or abdomen.  You have shortness of breath.  You have an increasing cough, or you cough up blood.  You have severe back or abdominal  pain.  You feel nauseous or vomit.  You have severe weakness.  You faint.  You have chills. This is an emergency. Do not wait to see if the pain will go away. Get medical help at once. Call your local emergency services (911 in [place] Do not drive yourself to the hospital. MAKE SURE YOU:   Understand these instructions.  Will watch your condition.  Will get help right away if you are not doing well or get worse. Document Released: 02/15/2005 Document Revised: 05/13/2013 Document Reviewed: 12/12/2007 [facility] Patient Information 2015 [place], [place]. This information is not intended to replace advice given to you by your health care provider. Make sure you discuss any questions you have with your health care provider.

## 2014-06-29 ENCOUNTER — Ambulatory Visit: Payer: Self-pay | Admitting: Cardiology

## 2014-07-02 ENCOUNTER — Ambulatory Visit (INDEPENDENT_AMBULATORY_CARE_PROVIDER_SITE_OTHER): Payer: Self-pay | Admitting: Cardiology

## 2014-07-02 ENCOUNTER — Encounter: Payer: Self-pay | Admitting: Cardiology

## 2014-07-02 VITALS — BP 130/76 | HR 54 | Ht 70.0 in | Wt 208.0 lb

## 2014-07-02 DIAGNOSIS — I2510 Atherosclerotic heart disease of native coronary artery without angina pectoris: Secondary | ICD-10-CM

## 2014-07-02 MED ORDER — LISINOPRIL 5 MG PO TABS: 5.0000 mg | ORAL_TABLET | Freq: Every day | ORAL | Status: DC

## 2014-07-02 MED ORDER — CARVEDILOL 3.125 MG PO TABS: 3.1250 mg | ORAL_TABLET | Freq: Two times a day (BID) | ORAL | Status: DC

## 2014-07-02 MED ORDER — LOVASTATIN 40 MG PO TABS
40.0000 mg | ORAL_TABLET | Freq: Every day | ORAL | Status: DC
Start: 2014-07-02 — End: 2015-01-11

## 2014-07-02 NOTE — Patient Instructions (Signed)
I have [facility]   [facility]. Lov[facility]atin 40mg  [facility] Coreg (carvedilol) 3.125mg  one tablet two times a day to [facility].  Your physician recommends that you schedule a follow-up appointment in: 4 months with Dr Shirlee LatchMcLean.

## 2014-07-04 NOTE — Progress Notes (Signed)
Patient ID: Mitchell [name], male   DOB: May 26, 1960, 54 y.o.   MRN: [id number] PCP: Dr. Orpah CobbAdvani  54 yo with history of CAD and inferior STEMI in 10/15 presents for cardiology followup.  Patient had total occlusion of RCA treated with Xience DES.  Echo post-PCI showed EF 55-60%.  He has had a tough time since his MI.  He lost his job and has no insurance.  He has had a hard time affording his medications, and he currently is taking on Effient and aspirin.  He has applied for Medicaid.  Symptoms are also difficult.  He has had a constant dull achy feeling in his chest ever since the MI in 10/15.  It is not worse with exertion.  Sometimes he will get an instantaneous sharp pain.  He went to the ER because of chest aching on 2/1.  Troponin was negative and ECG showed no change.  He has also had a fairly constant since of dizziness ever since his MI as well.  It does not seem to be positional.  He has not been orthostatic when checked.  He was on Coreg and lisinopril, now off.  Stopping the BP meds did not make any difference with the dizziness.    Labs (12/15): LDL 55, HDL 39.8 Labs (2/16): K 4.3, creatinine 0.5856m  ECG: NSR, LAFB, poor RWP  PMH: 1. Hyperlipidemia 2. CAD: Inferior STEMI in 10/15 with total occlusion RCA treated with Xience DES.  EF 45-50% with basal inferior hypokinesis on LV-gram.   3. Ischemic cardiomyopathy: LV-gram 10/15 with EF 45-50%, basal inferior hypokinesis.  Echo (1015) with EF 55-60%, basal to mid inferior and inferolateral hypokinesis.  4. Dyspnea with Brilinta.  5. "Dizziness:" Poorly characterized.   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   FH: CVA, MI in multiple family members.  Brother with PCI.   ROS: All systems reviewed and negative except as per HPI.   Current Outpatient Prescriptions  Medication Sig Dispense Refill  . ALPRAZolam (XANAX) 0.5 MG tablet Take 1 tablet (0.5 mg total) by mouth 2 (two) times daily as needed for  anxiety. 20 tablet 0  . aspirin EC 81 MG tablet Take 81 mg by mouth daily.    . cyclobenzaprine (FLEXERIL) 10 MG tablet Take 1 tablet (10 mg total) by mouth 2 (two) times daily as needed for muscle spasms. 20 tablet 0  . lisinopril (PRINIVIL,ZESTRIL) 5 MG tablet Take 1 tablet (5 mg total) by mouth daily. 30 tablet 10  . lovastatin (MEVACOR) 40 MG tablet Take 1 tablet (40 mg total) by mouth at bedtime. 30 tablet 6  . magnesium hydroxide (MILK OF MAGNESIA) 400 MG/5ML suspension Take 5 mLs by mouth daily as needed for mild constipation. 360 mL 0  . nitroGLYCERIN (NITROSTAT) 0.4 MG SL tablet Place 1 tablet (0.4 mg total) under the tongue every 5 (five) minutes as needed for chest pain. 25 tablet 3  . NITROSTAT 0.4 MG SL tablet   3  . traMADol (ULTRAM) 50 MG tablet Take 1 tablet (50 mg total) by mouth every 6 (six) hours as needed. 15 tablet 0  . Vitamin D, Ergocalciferol, (DRISDOL) 50000 UNITS CAPS capsule Take 1 capsule (50,000 Units total) by mouth every 7 (seven) days. 12 capsule 0  . carvedilol (COREG) 3.125 MG tablet Take 1 tablet (3.125 mg total) by mouth 2 (two) times daily. 60 tablet 6  . prasugrel (EFFIENT) 10 MG TABS tablet Take 1 tablet (10 mg total)  by mouth at bedtime. (Patient not taking: Reported on 07/02/2014) 30 tablet 6   No current facility-administered medications for this visit.   BP 130/76 mmHg  Pulse 54  Ht  (1.778 m)  Wt 208 lb (94.348 kg)  BMI 29.84 kg/m2  SpO2 96% General: NAD Neck: No JVD, no thyromegaly or thyroid nodule.  Lungs: Clear to auscultation bilaterally with normal respiratory effort. CV: Nondisplaced PMI.  Heart regular S1/S2, no S3/S4, no murmur.  No peripheral edema.  No carotid bruit.  Normal pedal pulses.  Abdomen: Soft, nontender, no hepatosplenomegaly, no distention.  Skin: Intact without lesions or rashes.  Neurologic: Alert and oriented x 3.  Psych: Normal affect. Extremities: No clubbing or cyanosis.  HEENT: Normal.    Assessment/Plan: 1. CAD: s/p inferior MI in 10/15.  Continue ASA long-term, continue Effient until 10/16.  He needs to restart lovastatin 40 daily, Coreg 3.125 mg bid, and lisinopril 5 mg daily.  He should be able to get these medications fairly cheaply, we will help him figure out the best place to purchase them.  2. Hyperlipidemia: Check lipids/LFTs 2 months after restarting lovastatin.  3. Dizziness: Not certain of the etiology, ?vertigo but not classic.  He has not been orthostatic and stopping BP meds made no difference.   Marca Ancona 07/04/2014 8:48 PM

## 2014-08-17 ENCOUNTER — Encounter: Payer: Self-pay | Admitting: Internal Medicine

## 2014-08-17 ENCOUNTER — Ambulatory Visit: Payer: Self-pay | Attending: Internal Medicine | Admitting: Internal Medicine

## 2014-08-17 VITALS — BP 136/90 | HR 72 | Temp 98.0°F | Resp 16 | Wt 205.8 lb

## 2014-08-17 DIAGNOSIS — E785 Hyperlipidemia, unspecified: Secondary | ICD-10-CM | POA: Insufficient documentation

## 2014-08-17 DIAGNOSIS — Z87891 Personal history of nicotine dependence: Secondary | ICD-10-CM | POA: Insufficient documentation

## 2014-08-17 DIAGNOSIS — Z7902 Long term (current) use of antithrombotics/antiplatelets: Secondary | ICD-10-CM | POA: Insufficient documentation

## 2014-08-17 DIAGNOSIS — I252 Old myocardial infarction: Secondary | ICD-10-CM | POA: Insufficient documentation

## 2014-08-17 DIAGNOSIS — I2510 Atherosclerotic heart disease of native coronary artery without angina pectoris: Secondary | ICD-10-CM | POA: Insufficient documentation

## 2014-08-17 DIAGNOSIS — M545 Low back pain: Secondary | ICD-10-CM

## 2014-08-17 DIAGNOSIS — Z955 Presence of coronary angioplasty implant and graft: Secondary | ICD-10-CM | POA: Insufficient documentation

## 2014-08-17 DIAGNOSIS — F419 Anxiety disorder, unspecified: Secondary | ICD-10-CM | POA: Insufficient documentation

## 2014-08-17 DIAGNOSIS — G8929 Other chronic pain: Secondary | ICD-10-CM | POA: Insufficient documentation

## 2014-08-17 DIAGNOSIS — R42 Dizziness and giddiness: Secondary | ICD-10-CM | POA: Insufficient documentation

## 2014-08-17 DIAGNOSIS — M47816 Spondylosis without myelopathy or radiculopathy, lumbar region: Secondary | ICD-10-CM

## 2014-08-17 DIAGNOSIS — Z7982 Long term (current) use of aspirin: Secondary | ICD-10-CM | POA: Insufficient documentation

## 2014-08-17 DIAGNOSIS — I10 Essential (primary) hypertension: Secondary | ICD-10-CM | POA: Insufficient documentation

## 2014-08-17 DIAGNOSIS — M1990 Unspecified osteoarthritis, unspecified site: Secondary | ICD-10-CM | POA: Insufficient documentation

## 2014-08-17 DIAGNOSIS — M479 Spondylosis, unspecified: Secondary | ICD-10-CM | POA: Insufficient documentation

## 2014-08-17 MED ORDER — MECLIZINE HCL 25 MG PO TABS: 25.0000 mg | ORAL_TABLET | Freq: Three times a day (TID) | ORAL | Status: DC | PRN

## 2014-08-17 NOTE — Progress Notes (Signed)
MRN: [id number] Name: Mitchell [name]  Sex: male Age: 54 y.o. DOB: 04-Aug-1960  Allergies: Review of patient's allergies indicates no known allergies.  Chief Complaint  Patient presents with  . Back Pain    HPI: Patient is 54 y.o. male who has history of CAD status post MI in 10/15, since the MI patient has leg symptoms of chest pain as well as dizziness, already evaluated  in the ER as well as by his cardiologist last month, he was advised to continue with statins, Coreg, lisinopril, dizziness/vertigo did not improve with stopping BP meds, that he was resumed back on his blood pressure medications by his cardiologist. Currently patient has occasional dizziness, patient has not tried any medication, also has chronic lower back in an x-ray done which reported DJD, never been evaluated by orthopedics as per patient sometimes the pain radiate down to his legs. Patient denies any incontinence. Denies any fever chills.  Past Medical History  Diagnosis Date  . Hyperlipidemia   . CAD (coronary artery disease)     a. 02/2014 Inf STEMI/PCI: LM nl, LAD min irregs, Diags 1/2/3 min irregs, LCX min irregs, OM1/2/3 min irregs, RI min irregs, RCA 100p (3.5x18 Xience DES), PDA/PL nl, EF 45-50% basal inf HK.  Marland Kitchen. NSVT (nonsustained ventricular tachycardia)     a. in the setting of inferior STEMI;  b. 02/2014 Echo: EF 55-60%, Gr 1 DD, basal-midinferolateral/inf HK, Gr 1 DD, triv TR.  . MI (myocardial infarction)   . Coronary artery disease     Past Surgical History  Procedure Laterality Date  . Cardiac catheterization      02/28/2014 prox RCA single vessel occlusion treated with DES  . Coronary angioplasty with stent placement    . Left heart cath Bilateral 02/28/2014    Procedure: LEFT HEART CATH;  Surgeon: Iran OuchMuhammad A Arida, MD;  [facility];  Service: Cardiovascular;  Laterality: Bilateral;      Medication List       This list is accurate as of: 08/17/14 11:20 AM.  Always use your  most recent med list.               ALPRAZolam 0.5 MG tablet  Commonly known as:  XANAX  Take 1 tablet (0.5 mg total) by mouth 2 (two) times daily as needed for anxiety.     aspirin EC 81 MG tablet  Take 81 mg by mouth daily.     carvedilol 3.125 MG tablet  Commonly known as:  COREG  Take 1 tablet (3.125 mg total) by mouth 2 (two) times daily.     cyclobenzaprine 10 MG tablet  Commonly known as:  FLEXERIL  Take 1 tablet (10 mg total) by mouth 2 (two) times daily as needed for muscle spasms.     lisinopril 5 MG tablet  Commonly known as:  PRINIVIL,ZESTRIL  Take 1 tablet (5 mg total) by mouth daily.     lovastatin 40 MG tablet  Commonly known as:  MEVACOR  Take 1 tablet (40 mg total) by mouth at bedtime.     magnesium hydroxide 400 MG/5ML suspension  Commonly known as:  MILK OF MAGNESIA  Take 5 mLs by mouth daily as needed for mild constipation.     meclizine 25 MG tablet  Commonly known as:  ANTIVERT  Take 1 tablet (25 mg total) by mouth 3 (three) times daily as needed for dizziness.     nitroGLYCERIN 0.4 MG SL tablet  Commonly known as:  NITROSTAT  Place 1 tablet (0.4 mg total) under the tongue every 5 (five) minutes as needed for chest pain.     NITROSTAT 0.4 MG SL tablet  Generic drug:  nitroGLYCERIN     prasugrel 10 MG Tabs tablet  Commonly known as:  EFFIENT  Take 1 tablet (10 mg total) by mouth at bedtime.     traMADol 50 MG tablet  Commonly known as:  ULTRAM  Take 1 tablet (50 mg total) by mouth every 6 (six) hours as needed.     Vitamin D (Ergocalciferol) 50000 UNITS Caps capsule  Commonly known as:  DRISDOL  Take 1 capsule (50,000 Units total) by mouth every 7 (seven) days.        Meds ordered this encounter  Medications  . meclizine (ANTIVERT) 25 MG tablet    Sig: Take 1 tablet (25 mg total) by mouth 3 (three) times daily as needed for dizziness.    Dispense:  30 tablet    Refill:  1     There is no immunization history on file for this  patient.  Family History  Problem Relation Age of Onset  . Heart attack Brother 51  . Hypertension Father     History  Substance Use Topics  . Smoking status: Former Games developer  . Smokeless tobacco: Not on file  . Alcohol Use: Yes    Review of Systems   As noted in HPI  Filed Vitals:   08/17/14 0948  BP: 136/90  Pulse: 72  Temp: 98 F (36.7 C)  Resp: 16    Physical Exam  Physical Exam  Constitutional: No distress.  Eyes: EOM are normal. Pupils are equal, round, and reactive to light.  No nystagmus  Neck: Neck supple.  Cardiovascular: Normal rate and regular rhythm.   Pulmonary/Chest: Breath sounds normal. No respiratory distress. He has no wheezes. He has no rales.  Musculoskeletal:  Minimal lower lumbar paraspinal tenderness, with SLR test patient is complaining of back tightness, strength equal to both lower extremities, DTR 2+.    CBC    Component Value Date/Time   WBC 5.0 06/22/2014 1202   RBC 4.58 06/22/2014 1202   HGB 15.5 06/22/2014 1202   HCT 43.6 06/22/2014 1202   PLT 217 06/22/2014 1202   MCV 95.2 06/22/2014 1202   LYMPHSABS 1.5 03/04/2014 1155   MONOABS 0.6 03/04/2014 1155   EOSABS 0.2 03/04/2014 1155   BASOSABS 0.0 03/04/2014 1155    CMP     Component Value Date/Time   NA 137 06/22/2014 1202   K 4.3 06/22/2014 1202   CL 106 06/22/2014 1202   CO2 24 06/22/2014 1202   GLUCOSE 96 06/22/2014 1202   BUN 17 06/22/2014 1202   CREATININE 0.86 06/22/2014 1202   CREATININE 0.83 03/11/2014 1531   CALCIUM 9.3 06/22/2014 1202   PROT 6.9 04/23/2014 0924   ALBUMIN 4.1 04/23/2014 0924   AST 24 04/23/2014 0924   ALT 22 04/23/2014 0924   ALKPHOS 89 04/23/2014 0924   BILITOT 0.7 04/23/2014 0924   GFRNONAA >90 06/22/2014 1202   GFRNONAA >89 03/11/2014 1531   GFRAA >90 06/22/2014 1202   GFRAA >89 03/11/2014 1531    Lab Results  Component Value Date/Time   CHOL 131 04/23/2014 09:24 AM    No components found for: HGA1C  Lab Results  Component  Value Date/Time   AST 24 04/23/2014 09:24 AM    Assessment and Plan  Lumbar spondylosis, unspecified spinal osteoarthritis - Plan: Ambulatory referral to Orthopedic Surgery  Dizziness and giddiness - Plan:nonspecific dizziness, vitals are stable, trial of meclizine (ANTIVERT) 25 MG tablet  Hyperlipidemia Currently patient is on lovastatin and is scheduled to follow with his cardiologist will repeat his cholesterol level  Chronic low back pain - Plan: Ambulatory referral to Orthopedic Surgery  History of MI (myocardial infarction) Currently following up with cardiology and is on aspirin, effient , statins, ACE inhibitor, Coreg denies any chest pain.   Return in about 3 months (around 11/17/2014) for hypertension.   This note has been created with Education officer, environmental. Any transcriptional errors are unintentional.    Doris Cheadle, MD

## 2014-08-17 NOTE — Progress Notes (Signed)
Patient complains of still having back pain that radiates down his leg Still having some dizzy spells Was turned down by Social security and really needs to get healthy so he Can go back to work

## 2014-09-15 ENCOUNTER — Telehealth: Payer: Self-pay

## 2014-09-15 NOTE — Telephone Encounter (Signed)
Called to order patient's effient from the patient assistance program

## 2014-09-17 ENCOUNTER — Telehealth: Payer: Self-pay

## 2014-09-17 NOTE — Telephone Encounter (Signed)
Called patient to let him know that his effeint was here at the office

## 2014-11-09 ENCOUNTER — Ambulatory Visit: Payer: Self-pay | Admitting: Cardiology

## 2015-01-11 ENCOUNTER — Encounter: Payer: Self-pay | Admitting: Cardiology

## 2015-01-11 ENCOUNTER — Ambulatory Visit (INDEPENDENT_AMBULATORY_CARE_PROVIDER_SITE_OTHER): Payer: Self-pay | Admitting: Cardiology

## 2015-01-11 VITALS — BP 110/78 | HR 55 | Ht 70.0 in | Wt 197.0 lb

## 2015-01-11 DIAGNOSIS — I2510 Atherosclerotic heart disease of native coronary artery without angina pectoris: Secondary | ICD-10-CM

## 2015-01-11 DIAGNOSIS — E785 Hyperlipidemia, unspecified: Secondary | ICD-10-CM

## 2015-01-11 MED ORDER — NITROGLYCERIN 0.4 MG SL SUBL: 0.4000 mg | SUBLINGUAL_TABLET | SUBLINGUAL | Status: DC | PRN

## 2015-01-11 MED ORDER — ATORVASTATIN CALCIUM 20 MG PO TABS: 20.0000 mg | ORAL_TABLET | Freq: Every day | ORAL | Status: DC

## 2015-01-11 NOTE — Patient Instructions (Signed)
Medication Instructions:  Start atorvastatin (lipitor)  daily  Labwork: Your physician recommends that you return for a FASTING lipid profile /liver profile in 2 months.   Testing/Procedures: None today  Follow-Up: Your physician wants you to follow-up in: January 2017 with Dr Shirlee Latch.  You will receive a reminder letter in the mail two months in advance. If you don't receive a letter, please call our office to schedule the follow-up appointment.

## 2015-01-12 NOTE — Progress Notes (Signed)
Patient ID: Mitchell [name], male   DOB: Jul 06, 1960, 54 y.o.   MRN: [id number] PCP: Dr. Orpah Cobb  54 yo with history of CAD and inferior STEMI in 10/15 presents for cardiology followup.  Patient had total occlusion of RCA treated with Xience DES.  Echo post-PCI showed EF 55-60%.  He initially had a lot of dizziness after his MI, this has resolved for the most part.  He had one episode of dizziness/lightheadedness a couple of weeks ago while in the car.  He pulled over and relaxed, and the sensation resolved. No palpitations.  No recurrence since then.  He will sometimes get chest pain when lying down in the bed at night.  No exertional chest pain.  No exertional dyspnea.    Labs (12/15): LDL 55, HDL 39.8 Labs (2/16): K 4.3, creatinine 0.86  ECG: NSR, LAFB  PMH: 1. Hyperlipidemia 2. CAD: Inferior STEMI in 10/15 with total occlusion RCA treated with Xience DES.  EF 45-50% with basal inferior hypokinesis on LV-gram.   3. Ischemic cardiomyopathy: LV-gram 10/15 with EF 45-50%, basal inferior hypokinesis.  Echo (1015) with EF 55-60%, basal to mid inferior and inferolateral hypokinesis.  4. Dyspnea with Brilinta.   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   FH: CVA, MI in multiple family members.  Brother MI x 2, including stent thrombosis, in his 63s.   ROS: All systems reviewed and negative except as per HPI.   Current Outpatient Prescriptions  Medication Sig Dispense Refill  . aspirin EC 81 MG tablet Take 81 mg by mouth daily.    . carvedilol (COREG) 3.125 MG tablet Take 1 tablet (3.125 mg total) by mouth 2 (two) times daily. 60 tablet 6  . lisinopril (PRINIVIL,ZESTRIL) 5 MG tablet Take 1 tablet (5 mg total) by mouth daily. 30 tablet 10  . nitroGLYCERIN (NITROSTAT) 0.4 MG SL tablet Place 1 tablet (0.4 mg total) under the tongue every 5 (five) minutes as needed for chest pain. 25 tablet 3  . prasugrel (EFFIENT) 10 MG TABS tablet Take 1 tablet (10 mg total) by  mouth at bedtime. 30 tablet 6  . atorvastatin (LIPITOR) 20 MG tablet Take 1 tablet (20 mg total) by mouth daily. 30 tablet 3   No current facility-administered medications for this visit.   BP 110/78 mmHg  Pulse 55  Ht  (1.778 m)  Wt 197 lb (89.359 kg)  BMI 28.27 kg/m2 General: NAD Neck: No JVD, no thyromegaly or thyroid nodule.  Lungs: Clear to auscultation bilaterally with normal respiratory effort. CV: Nondisplaced PMI.  Heart regular S1/S2, no S3/S4, no murmur.  No peripheral edema.  No carotid bruit.  Normal pedal pulses.  Abdomen: Soft, nontender, no hepatosplenomegaly, no distention.  Skin: Intact without lesions or rashes.  Neurologic: Alert and oriented x 3.  Psych: Normal affect. Extremities: No clubbing or cyanosis.   Assessment/Plan: 1. CAD: s/p inferior MI in 10/15.  No worrisome chest pain. - Continue ASA long-term, continue Effient until 10/16. DAPT score = 2 and he had a brother with stent thrombosis, so would favor long-term DAPT.  I will start him on clopidogrel 75 daily after 1 year of Effient.  - Continue Coreg and lisinopril.  2. Hyperlipidemia: Off statin, not sure why (no side effects).  I will start him back on atorvastatin 20 mg daily with lipids/LFTs in 2 months.   Marca Ancona 01/12/2015

## 2015-03-08 ENCOUNTER — Other Ambulatory Visit: Payer: Self-pay | Admitting: *Deleted

## 2015-03-08 DIAGNOSIS — I2510 Atherosclerotic heart disease of native coronary artery without angina pectoris: Secondary | ICD-10-CM

## 2015-03-08 LAB — HEPATIC FUNCTION PANEL
ALT: 15 U/L (ref 9–46)
AST: 16 U/L (ref 10–35)
Albumin: 3.9 g/dL (ref 3.6–5.1)
Alkaline Phosphatase: 70 U/L (ref 40–115)
BILIRUBIN DIRECT: 0.1 mg/dL (ref <=0.2)
BILIRUBIN TOTAL: 0.7 mg/dL (ref 0.2–1.2)
Indirect Bilirubin: 0.6 mg/dL (ref 0.2–1.2)
Total Protein: 6.9 g/dL (ref 6.1–8.1)

## 2015-03-08 LAB — LIPID PANEL
CHOL/HDL RATIO: 2.7 ratio (ref <=5.0)
Cholesterol: 134 mg/dL (ref 125–200)
HDL: 50 mg/dL (ref >=40)
LDL Cholesterol: 63 mg/dL (ref <130)
Triglycerides: 107 mg/dL (ref <150)
VLDL: 21 mg/dL (ref <30)

## 2015-03-08 NOTE — Addendum Note (Signed)
Addended by: Tonita PhoenixBOWDEN, ROBIN K on: 03/08/2015 07:53 AM   Modules accepted: Orders

## 2015-03-15 ENCOUNTER — Encounter (HOSPITAL_COMMUNITY): Payer: Self-pay | Admitting: *Deleted

## 2015-03-17 ENCOUNTER — Encounter: Payer: Self-pay | Admitting: *Deleted

## 2015-03-17 ENCOUNTER — Telehealth: Payer: Self-pay | Admitting: *Deleted

## 2015-03-17 NOTE — Telephone Encounter (Signed)
pt actually walked in today, needed effient, he was on the [place] program and got his last round of medication in april, i will give him samples today and have advised him to call Odette HornsLinda Reiland & i will send her a message as well to check on the program.

## 2015-03-29 IMAGING — CT CT HEAD W/O CM
1 of 2 series · 15 of 30 positions shown, 19 images · non-contrast
Comparison: None.

CLINICAL DATA: 53-year-old male with a history of facial numbness
and left arm tingling for 3 days.

Cardiovascular risk factors include prior myocardial infarction and
hypertension.
EXAM:
CT HEAD WITHOUT CONTRAST
TECHNIQUE: Contiguous axial images were obtained from the base of the skull
through the vertex without intravenous contrast.

[Series 3: head 2.0 h70h · axial · 0.45mm/px · z∈[+1268,+1398]mm · 15 of 73 slices shown, 19 images]
[im 4/73  brain]
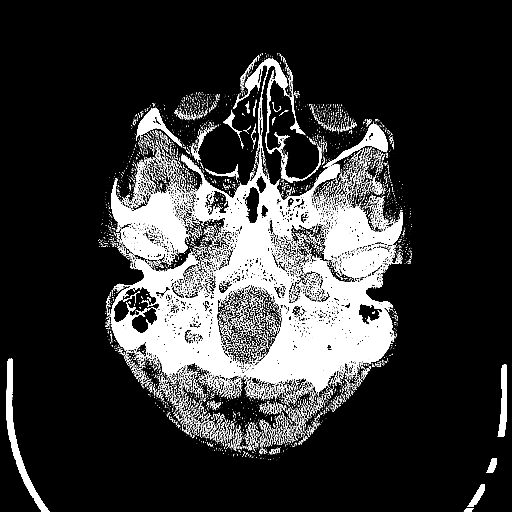
[im 4/73  bone]
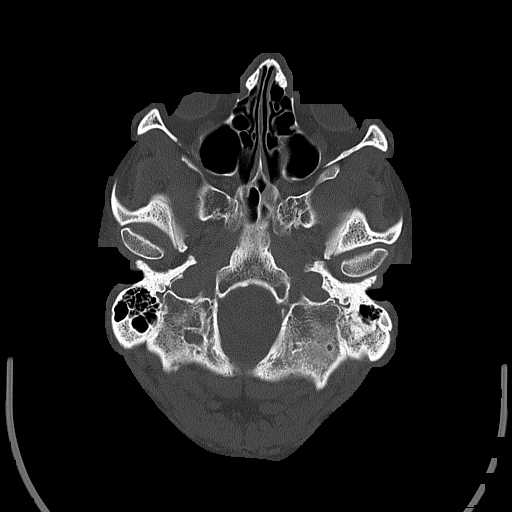
[im 8/73  brain]
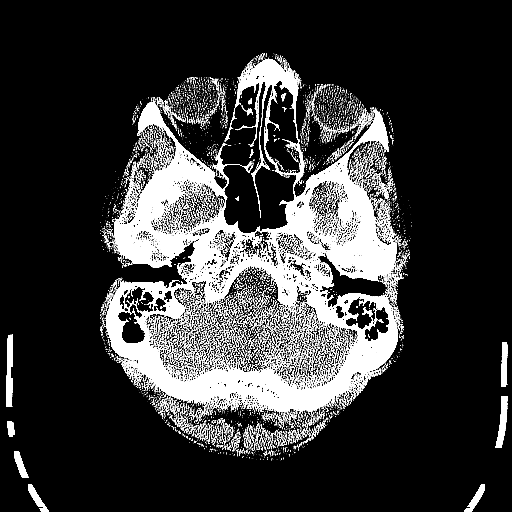
[im 15/73  brain]
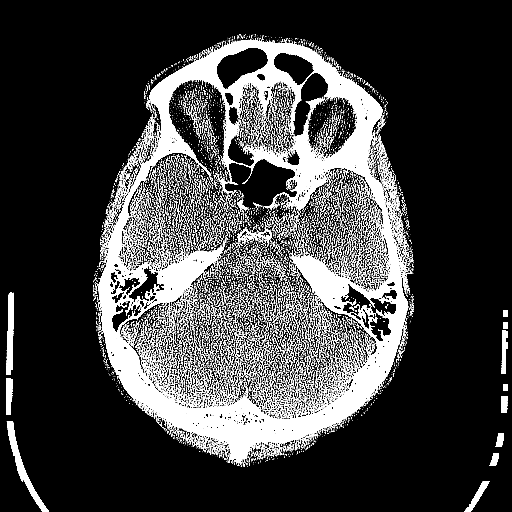
[im 19/73  brain]
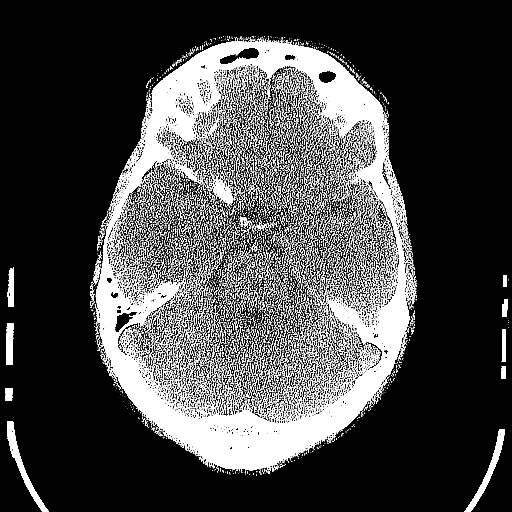
[im 22/73  brain]
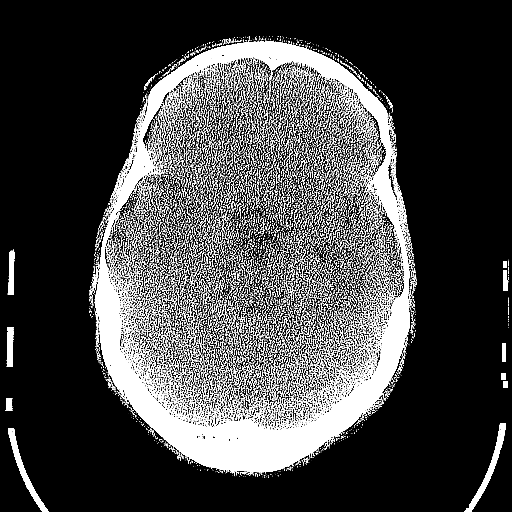
[im 22/73  bone]
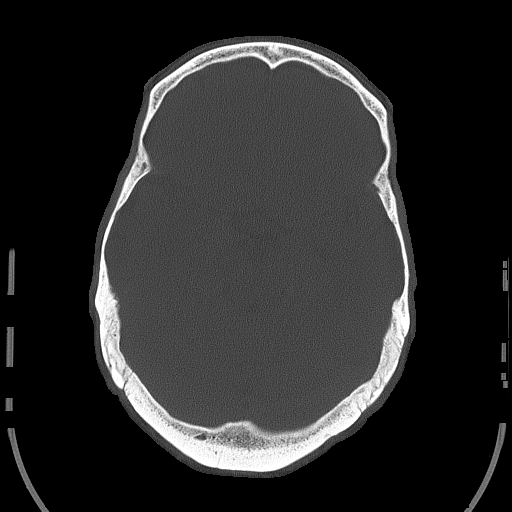
[im 26/73  brain]
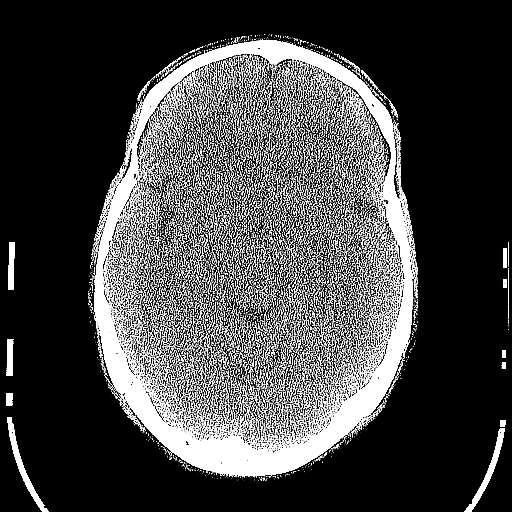
[im 33/73  brain]
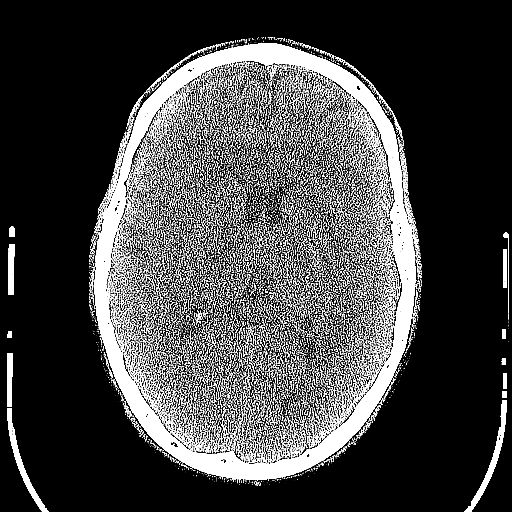
[im 37/73  brain]
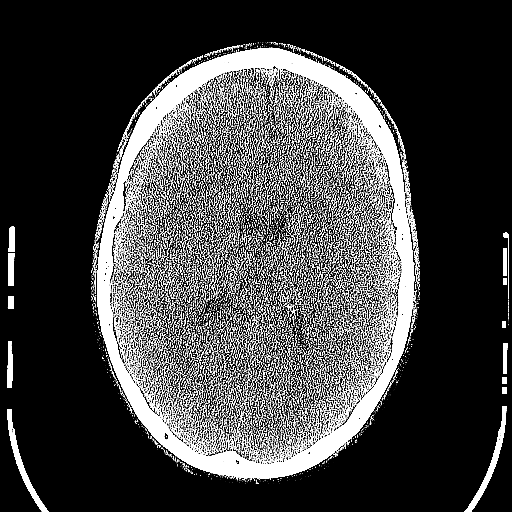
[im 40/73  brain]
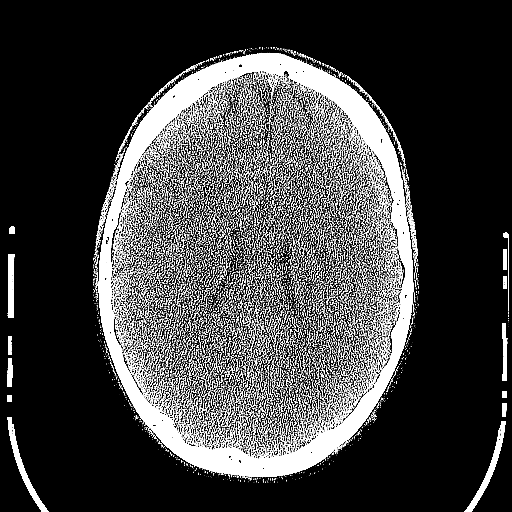
[im 40/73  bone]
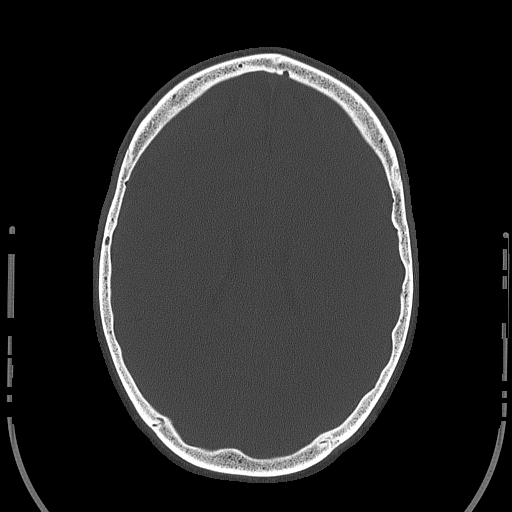
[im 47/73  brain]
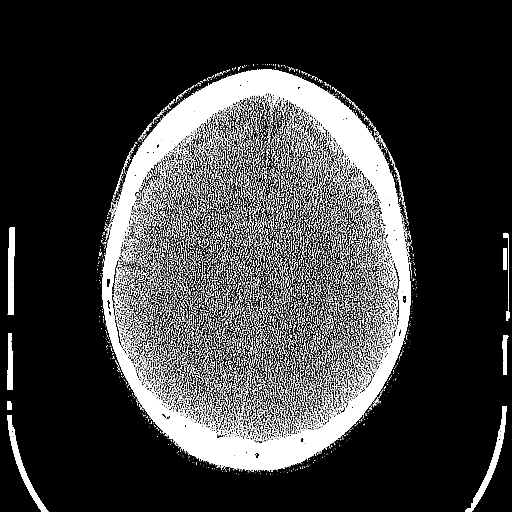
[im 51/73  brain]
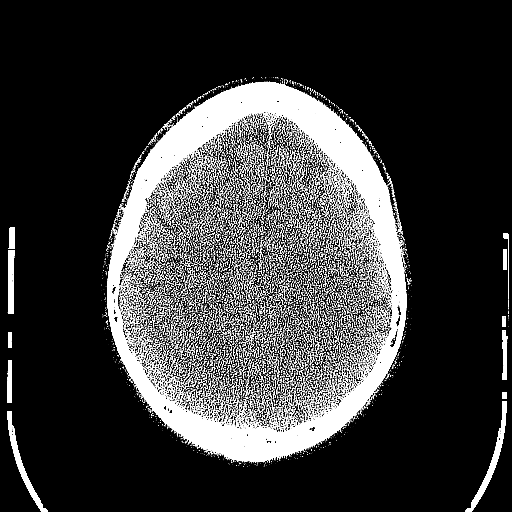
[im 55/73  brain]
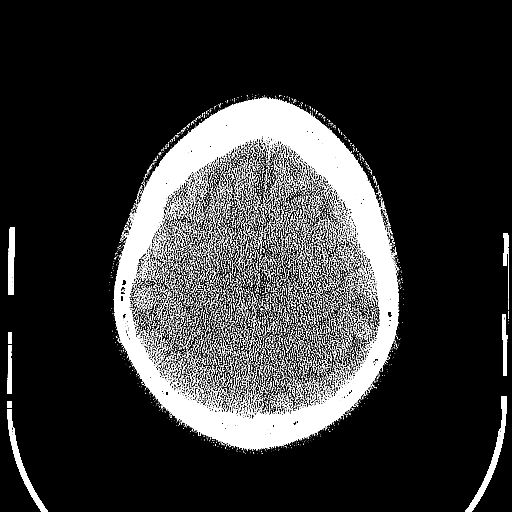
[im 58/73  brain]
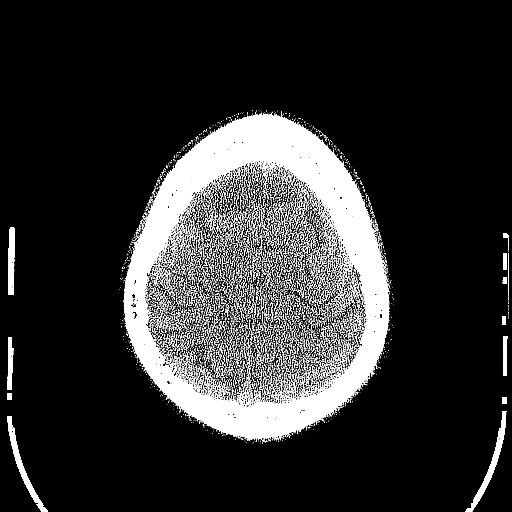
[im 58/73  bone]
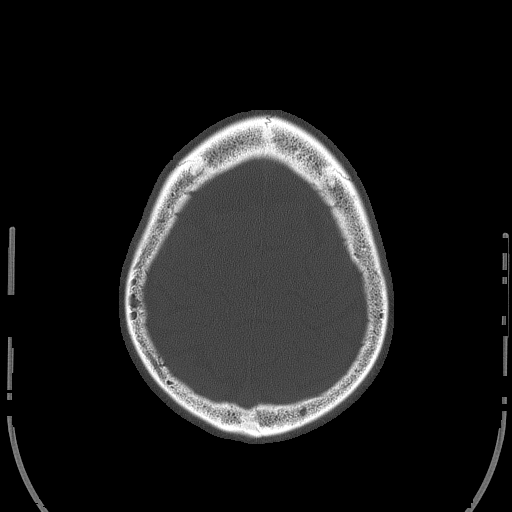
[im 65/73  brain]
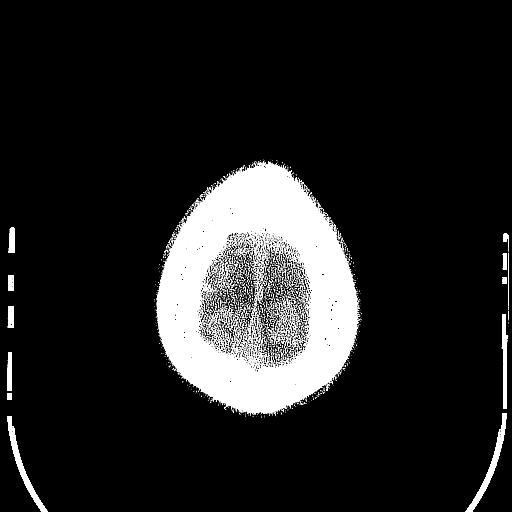
[im 69/73  brain]
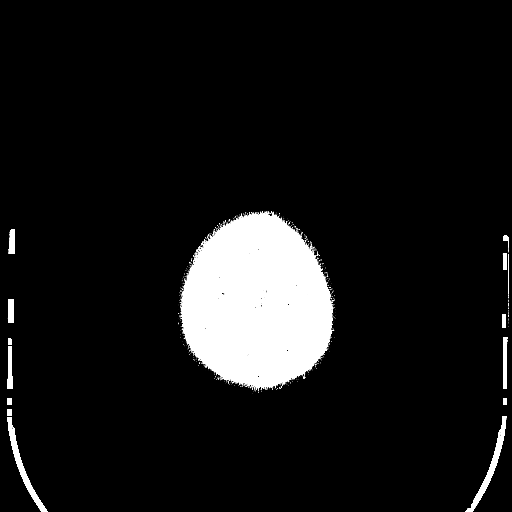

[15 of 30 positions shown; findings below may reference images not displayed]

FINDINGS: Unremarkable appearance of the calvarium without acute fracture or
aggressive lesion.

Unremarkable appearance of the scalp soft tissues.

Unremarkable appearance of the bilateral orbits.

Mastoid air cells are clear.

Trace mucosal disease of the ethmoid air cells. Lobulated soft
tissue of the left sphenoid sinus. This may represent mucous
retention cyst or polyp.

No acute intracranial hemorrhage, midline shift, or mass effect.

Gray-white differentiation is maintained, without CT evidence of
acute ischemia.

Unremarkable configuration of the ventricles.

Unremarkable appearance of the middle ear and inner ear structures
on this head CT.
IMPRESSION: No CT evidence of acute intracranial abnormality.

Trace paranasal sinus disease.

## 2015-05-28 ENCOUNTER — Encounter: Payer: Self-pay | Admitting: Cardiology

## 2015-05-28 ENCOUNTER — Ambulatory Visit (INDEPENDENT_AMBULATORY_CARE_PROVIDER_SITE_OTHER): Payer: Self-pay | Admitting: Cardiology

## 2015-05-28 VITALS — BP 154/94 | HR 58 | Ht 71.0 in | Wt 217.8 lb

## 2015-05-28 DIAGNOSIS — E785 Hyperlipidemia, unspecified: Secondary | ICD-10-CM

## 2015-05-28 DIAGNOSIS — I2510 Atherosclerotic heart disease of native coronary artery without angina pectoris: Secondary | ICD-10-CM

## 2015-05-28 MED ORDER — BISOPROLOL FUMARATE 5 MG PO TABS: 5.0000 mg | ORAL_TABLET | Freq: Every day | ORAL | Status: DC

## 2015-05-28 MED ORDER — ATORVASTATIN CALCIUM 20 MG PO TABS: 20.0000 mg | ORAL_TABLET | Freq: Every day | ORAL | Status: DC

## 2015-05-28 MED ORDER — LISINOPRIL 5 MG PO TABS: 5.0000 mg | ORAL_TABLET | Freq: Every day | ORAL | Status: DC

## 2015-05-28 MED ORDER — CLOPIDOGREL BISULFATE 75 MG PO TABS: 75.0000 mg | ORAL_TABLET | Freq: Every day | ORAL | Status: DC

## 2015-05-28 NOTE — Patient Instructions (Signed)
Medication Instructions:  Stop coreg (carvedilol).  Start bisoprolol 5mg  daily.  Start Plavix 75mg  daily.  Labwork: None today.  Testing/Procedures: None   Follow-Up: Your physician wants you to follow-up in: 6 months (July 2017).  You will receive a reminder letter in the mail two months in advance. If you don't receive a letter, please call our office to schedule the follow-up appointment.        If you need a refill on your cardiac medications before your next appointment, please call your pharmacy.

## 2015-05-29 NOTE — Progress Notes (Signed)
Patient ID: Mitchell [name], male   DOB: 16-Nov-1960, 55 y.o.   MRN: [id number] PCP: Dr. Orpah CobbAdvani  55 yo with history of CAD and inferior STEMI in 10/15 presents for cardiology followup.  Patient had total occlusion of RCA treated with Xience DES.  Echo post-PCI showed EF 55-60%.  He is doing well.  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  No lightheadedness.  No palpitations.  He has been out of Effient for over a month (too expensive).  He misses his pm Coreg dose frequently.   Labs (12/15): LDL 55, HDL 39.8 Labs (2/16): K 4.3, creatinine 0.86 Labs (10/16): LDL 63, HDL 50, LFTs normal  ECG: NSR, LAFB  PMH: 1. Hyperlipidemia 2. CAD: Inferior STEMI in 10/15 with total occlusion RCA treated with Xience DES.  EF 45-50% with basal inferior hypokinesis on LV-gram.   3. Ischemic cardiomyopathy: LV-gram 10/15 with EF 45-50%, basal inferior hypokinesis.  Echo (10/15) with EF 55-60%, basal to mid inferior and inferolateral hypokinesis.  4. Dyspnea with Brilinta.   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   FH: CVA, MI in multiple family members.  Brother MI x 2, including stent thrombosis, in his 5050s.   ROS: All systems reviewed and negative except as per HPI.   Current Outpatient Prescriptions  Medication Sig Dispense Refill  . aspirin EC 81 MG tablet Take 81 mg by mouth daily.    Marland Kitchen. atorvastatin (LIPITOR) 20 MG tablet Take 1 tablet (20 mg total) by mouth daily. 90 tablet 1  . lisinopril (PRINIVIL,ZESTRIL) 5 MG tablet Take 1 tablet (5 mg total) by mouth daily. 90 tablet 1  . nitroGLYCERIN (NITROSTAT) 0.4 MG SL tablet Place 1 tablet (0.4 mg total) under the tongue every 5 (five) minutes as needed for chest pain. 25 tablet 3  . bisoprolol (ZEBETA) 5 MG tablet Take 1 tablet (5 mg total) by mouth daily. 90 tablet 1  . clopidogrel (PLAVIX) 75 MG tablet Take 1 tablet (75 mg total) by mouth daily. 90 tablet 1    No current facility-administered medications for this visit.   BP 154/94 mmHg  Pulse 58  Ht 5\' 11"  (1.803 m)  Wt 217 lb 12.8 oz (98.793 kg)  BMI 30.39 kg/m2 General: NAD Neck: No JVD, no thyromegaly or thyroid nodule.  Lungs: Clear to auscultation bilaterally with normal respiratory effort. CV: Nondisplaced PMI.  Heart regular S1/S2, no S3/S4, no murmur.  No peripheral edema.  No carotid bruit.  Normal pedal pulses.  Abdomen: Soft, nontender, no hepatosplenomegaly, no distention.  Skin: Intact without lesions or rashes.  Neurologic: Alert and oriented x 3.  Psych: Normal affect. Extremities: No clubbing or cyanosis.   Assessment/Plan: 1. CAD: s/p inferior MI in 10/15.  No worrisome chest pain. - Continue ASA long-term.  DAPT score = 2 and he had a brother with stent thrombosis, so would favor long-term DAPT.  I will start him on clopidogrel 75 daily.  - Continue lisinopril. - Stop Coreg (not taking evening dose regularly) and will start bisoprolol 5 mg daily.   2. Hyperlipidemia: Good lipids in 10/16.  Continue statin.    Marca AnconaDalton Cobi Aldape 05/29/2015

## 2016-02-14 ENCOUNTER — Other Ambulatory Visit: Payer: Self-pay | Admitting: Cardiology

## 2016-02-14 DIAGNOSIS — I2510 Atherosclerotic heart disease of native coronary artery without angina pectoris: Secondary | ICD-10-CM

## 2016-02-14 DIAGNOSIS — E785 Hyperlipidemia, unspecified: Secondary | ICD-10-CM

## 2016-02-14 MED ORDER — BISOPROLOL FUMARATE 5 MG PO TABS: 5.0000 mg | ORAL_TABLET | Freq: Every day | ORAL | 0 refills | Status: DC

## 2016-02-14 MED ORDER — CLOPIDOGREL BISULFATE 75 MG PO TABS: 75.0000 mg | ORAL_TABLET | Freq: Every day | ORAL | 0 refills | Status: DC

## 2016-02-14 MED ORDER — ATORVASTATIN CALCIUM 20 MG PO TABS: 20.0000 mg | ORAL_TABLET | Freq: Every day | ORAL | 0 refills | Status: DC

## 2016-02-14 MED ORDER — LISINOPRIL 5 MG PO TABS: 5.0000 mg | ORAL_TABLET | Freq: Every day | ORAL | 0 refills | Status: DC

## 2016-02-22 ENCOUNTER — Encounter: Payer: Self-pay | Admitting: Physician Assistant

## 2016-02-22 ENCOUNTER — Ambulatory Visit: Payer: Self-pay | Admitting: Physician Assistant

## 2016-02-22 NOTE — Progress Notes (Deleted)
Cardiology Office Note:    Date:  02/22/2016   ID:  Mitchell [name], DOB 1961/03/10, MRN [id number]  PCP:  Doris Cheadle, MD  Cardiologist:  Dr. Marca Ancona   Electrophysiologist:  n/a  Referring MD: No ref. provider found   No chief complaint on file. ***  History of Present Illness:    Mitchell [name] is a 55 y.o. male with a hx of CAD s/p inferior STEMI in 10/15 tx with Xience DES.  EF was 45-50% with inferior HK at the time of his MI.  FU echocardiogram in 10/15 demonstrated normal EF. Last seen by Dr. Marca Ancona in 1/17.  DAPT score = 2.  It was decided to keep him on long term dual antiplatelet Rx with ASA and Plavix.   ***  Prior CV studies that were reviewed today include:    Echo 02/28/14 EF 55-60%, inf-lat and inf HK, Gr 1 DD, atrial septal aneurysm  LHC 02/28/14 LM ok LAD irregs LCx irregs RI irregs RCA prox 100 EF 45-50%, inf HK PCI: 3.5 x 18 mm Xience DES to RCA  Past Medical History:  Diagnosis Date  . CAD (coronary artery disease)    a. 02/2014 Inf STEMI/PCI: LM nl, LAD min irregs, Diags 1/2/3 min irregs, LCX min irregs, OM1/2/3 min irregs, RI min irregs, RCA 100p (3.5x18 Xience DES), PDA/PL nl, EF 45-50% basal inf HK.  Marland Kitchen History of acute inferior wall MI    Tx with DES to RCA  . Hyperlipidemia   . Intolerance of drug    dyspnea with Brilinta >> changed to Effient   . Ischemic cardiomyopathy    a. EF 45-50% with inf HK at Inf STEMI // b. Echo in 10/15 with EF 55-60% with inf and inf-lat HK  . NSVT (nonsustained ventricular tachycardia) (HCC)    a. in the setting of inferior STEMI;  b. 02/2014 Echo: EF 55-60%, Gr 1 DD, basal-midinferolateral/inf HK, Gr 1 DD, triv TR.    Past Surgical History:  Procedure Laterality Date  . CARDIAC CATHETERIZATION     02/28/2014 prox RCA single vessel occlusion treated with DES  . CORONARY ANGIOPLASTY WITH STENT PLACEMENT    . LEFT HEART CATH Bilateral 02/28/2014   Procedure: LEFT HEART CATH;  Surgeon:  Mitchell Ouch, MD;  Location: [facility] C[facility] LAB;  Service: Cardiovascular;  Laterality: Bilateral;    Current Medications: No outpatient prescriptions have been marked as taking for the 02/22/16 encounter (Appointment) with Beatrice Lecher, PA-C.     Allergies:   Review of patient's allergies indicates no known allergies.   Social History   Social History  . Marital status: Single    Spouse name: N/A  . Number of children: N/A  . Years of education: N/A   Social History Main Topics  . Smoking status: Former Games developer  . Smokeless tobacco: Never Used  . Alcohol use 0.0 oz/week  . Drug use: Unknown  . Sexual activity: Not Asked   Other Topics Concern  . None   Social History Narrative   ** Merged History Encounter **         Family History:  The patient's ***family history includes Heart attack (age of onset: 7) in his brother; Hypertension in his father.   ROS:   Please see the history of present illness.    ROS All other systems reviewed and are negative.   EKGs/Labs/Other Test Reviewed:    EKG:  EKG is *** ordered today.  The ekg ordered today  demonstrates ***  Recent Labs: 03/08/2015: ALT 15   Recent Lipid Panel    Component Value Date/Time   CHOL 134 03/08/2015 0753   TRIG 107 03/08/2015 0753   HDL 50 03/08/2015 0753   CHOLHDL 2.7 03/08/2015 0753   VLDL 21 03/08/2015 0753   LDLCALC 63 03/08/2015 0753     Physical Exam:    VS:  There were no vitals taken for this visit.    Wt Readings from Last 3 Encounters:  05/28/15 217 lb 12.8 oz (98.8 kg)  01/11/15 197 lb (89.4 kg)  08/17/14 205 lb 12.8 oz (93.4 kg)     ***Physical Exam  ASSESSMENT:    1. Coronary artery disease involving native coronary artery of native heart without angina pectoris   2. Pure hypercholesterolemia   3. Essential hypertension    PLAN:    In order of problems listed above:  1. CAD - s/p inferior MI in 2015 tx with DES to RCA.  No sig CAD elsewhere.  DAPT score 2.  He will  remain on long term Plavix and ASA.  EF was low at the time of his MI, but FU echo demonstrated normal EF with inf and inf-lat HK.  ***  -  Continue ASA, Plavix, statin, ACE inhibitor, beta-blocker.  2. HL - LDL in 10/16 was 63. ***  3. HTN - ***   Medication Adjustments/Labs and Tests Ordered: Current medicines are reviewed at length with the patient today.  Concerns regarding medicines are outlined above.  Medication changes, Labs and Tests ordered today are outlined in the Patient Instructions noted below. There are no Patient Instructions on file for this visit. Signed, Tereso NewcomerScott Keonta Alsip, PA-C  02/22/2016 2:53 PM    [facility] [address], [place], [place]  [address] Phone: [phone]; Fax: [phone]

## 2016-03-01 ENCOUNTER — Encounter: Payer: Self-pay | Admitting: Physician Assistant

## 2016-03-01 ENCOUNTER — Encounter: Payer: Self-pay | Admitting: Nurse Practitioner

## 2017-01-12 ENCOUNTER — Observation Stay (HOSPITAL_COMMUNITY): Payer: Self-pay

## 2017-01-12 ENCOUNTER — Other Ambulatory Visit: Payer: Self-pay

## 2017-01-12 ENCOUNTER — Observation Stay (HOSPITAL_COMMUNITY)
Admission: EM | Admit: 2017-01-12 | Discharge: 2017-01-12 | Discharge status: Against Medical Advice | Payer: Self-pay | Attending: Family Medicine | Admitting: Family Medicine

## 2017-01-12 ENCOUNTER — Encounter (HOSPITAL_COMMUNITY): Payer: Self-pay | Admitting: Emergency Medicine

## 2017-01-12 ENCOUNTER — Emergency Department (HOSPITAL_COMMUNITY): Payer: Self-pay

## 2017-01-12 DIAGNOSIS — F419 Anxiety disorder, unspecified: Secondary | ICD-10-CM | POA: Insufficient documentation

## 2017-01-12 DIAGNOSIS — R202 Paresthesia of skin: Secondary | ICD-10-CM | POA: Insufficient documentation

## 2017-01-12 DIAGNOSIS — I10 Essential (primary) hypertension: Secondary | ICD-10-CM | POA: Insufficient documentation

## 2017-01-12 DIAGNOSIS — I2510 Atherosclerotic heart disease of native coronary artery without angina pectoris: Secondary | ICD-10-CM | POA: Insufficient documentation

## 2017-01-12 DIAGNOSIS — Z87891 Personal history of nicotine dependence: Secondary | ICD-10-CM | POA: Insufficient documentation

## 2017-01-12 DIAGNOSIS — I639 Cerebral infarction, unspecified: Secondary | ICD-10-CM

## 2017-01-12 DIAGNOSIS — Z7982 Long term (current) use of aspirin: Secondary | ICD-10-CM | POA: Insufficient documentation

## 2017-01-12 DIAGNOSIS — G459 Transient cerebral ischemic attack, unspecified: Secondary | ICD-10-CM

## 2017-01-12 DIAGNOSIS — Z7902 Long term (current) use of antithrombotics/antiplatelets: Secondary | ICD-10-CM | POA: Insufficient documentation

## 2017-01-12 DIAGNOSIS — I252 Old myocardial infarction: Secondary | ICD-10-CM | POA: Insufficient documentation

## 2017-01-12 DIAGNOSIS — H538 Other visual disturbances: Principal | ICD-10-CM | POA: Insufficient documentation

## 2017-01-12 DIAGNOSIS — E785 Hyperlipidemia, unspecified: Secondary | ICD-10-CM | POA: Insufficient documentation

## 2017-01-12 HISTORY — DX: Transient cerebral ischemic attack, unspecified: G45.9

## 2017-01-12 LAB — I-STAT CHEM 8, ED
BUN: 17 mg/dL (ref 6–20)
CHLORIDE: 108 mmol/L (ref 101–111)
Calcium, Ion: 1.06 mmol/L — ABNORMAL LOW (ref 1.15–1.40)
Creatinine, Ser: 0.9 mg/dL (ref 0.61–1.24)
Glucose, Bld: 79 mg/dL (ref 65–99)
HEMATOCRIT: 43 % (ref 39.0–52.0)
Hemoglobin: 14.6 g/dL (ref 13.0–17.0)
Potassium: 4.2 mmol/L (ref 3.5–5.1)
SODIUM: 142 mmol/L (ref 135–145)
TCO2: 25 mmol/L (ref 22–32)

## 2017-01-12 LAB — COMPREHENSIVE METABOLIC PANEL
ALBUMIN: 3.9 g/dL (ref 3.5–5.0)
ALK PHOS: 81 U/L (ref 38–126)
ALT: 24 U/L (ref 17–63)
ANION GAP: 7 (ref 5–15)
AST: 24 U/L (ref 15–41)
BUN: 15 mg/dL (ref 6–20)
CALCIUM: 9.3 mg/dL (ref 8.9–10.3)
CO2: 24 mmol/L (ref 22–32)
Chloride: 109 mmol/L (ref 101–111)
Creatinine, Ser: 0.92 mg/dL (ref 0.61–1.24)
GFR calc Af Amer: >60 mL/min (ref >60)
GFR calc non Af Amer: >60 mL/min (ref >60)
GLUCOSE: 79 mg/dL (ref 65–99)
POTASSIUM: 4.2 mmol/L (ref 3.5–5.1)
SODIUM: 140 mmol/L (ref 135–145)
Total Bilirubin: 0.5 mg/dL (ref 0.3–1.2)
Total Protein: 7.1 g/dL (ref 6.5–8.1)

## 2017-01-12 LAB — PROTIME-INR
INR: 0.93
Prothrombin Time: 12.5 seconds (ref 11.4–15.2)

## 2017-01-12 LAB — DIFFERENTIAL
Basophils Absolute: 0 10*3/uL (ref 0.0–0.1)
Basophils Relative: 1 %
EOS PCT: 3 %
Eosinophils Absolute: 0.2 10*3/uL (ref 0.0–0.7)
LYMPHS PCT: 33 %
Lymphs Abs: 1.8 10*3/uL (ref 0.7–4.0)
Monocytes Absolute: 0.3 10*3/uL (ref 0.1–1.0)
Monocytes Relative: 6 %
Neutro Abs: 3.1 10*3/uL (ref 1.7–7.7)
Neutrophils Relative %: 57 %

## 2017-01-12 LAB — CBC
HEMATOCRIT: 42.9 % (ref 39.0–52.0)
HEMOGLOBIN: 14.5 g/dL (ref 13.0–17.0)
MCH: 32.2 pg (ref 26.0–34.0)
MCHC: 33.8 g/dL (ref 30.0–36.0)
MCV: 95.3 fL (ref 78.0–100.0)
Platelets: 235 10*3/uL (ref 150–400)
RBC: 4.5 MIL/uL (ref 4.22–5.81)
RDW: 12.5 % (ref 11.5–15.5)
WBC: 5.3 10*3/uL (ref 4.0–10.5)

## 2017-01-12 LAB — I-STAT TROPONIN, ED: Troponin i, poc: 0 ng/mL (ref 0.00–0.08)

## 2017-01-12 LAB — APTT: aPTT: 28 seconds (ref 24–36)

## 2017-01-12 MED ORDER — VITAMIN B-1 100 MG PO TABS: 100.0000 mg | ORAL_TABLET | Freq: Every day | ORAL | Status: DC

## 2017-01-12 MED ORDER — ATORVASTATIN CALCIUM 40 MG PO TABS
40.0000 mg | ORAL_TABLET | Freq: Every day | ORAL | Status: DC
  Filled 2017-01-12: qty 1

## 2017-01-12 MED ORDER — ASPIRIN 325 MG PO TABS: 325.0000 mg | ORAL_TABLET | Freq: Every day | ORAL | Status: DC

## 2017-01-12 MED ORDER — THIAMINE HCL 100 MG/ML IJ SOLN: 100.0000 mg | Freq: Every day | INTRAMUSCULAR | Status: DC

## 2017-01-12 MED ORDER — STROKE: EARLY STAGES OF RECOVERY BOOK
Freq: Once | Status: DC
  Filled 2017-01-12: qty 1

## 2017-01-12 MED ORDER — CLOPIDOGREL BISULFATE 75 MG PO TABS
75.0000 mg | ORAL_TABLET | Freq: Every day | ORAL | Status: DC
  Administered 2017-01-12: 75 mg via ORAL
  Filled 2017-01-12: qty 1

## 2017-01-12 MED ORDER — ACETAMINOPHEN 160 MG/5ML PO SOLN: 650.0000 mg | ORAL | Status: DC | PRN

## 2017-01-12 MED ORDER — ACETAMINOPHEN 650 MG RE SUPP: 650.0000 mg | RECTAL | Status: DC | PRN

## 2017-01-12 MED ORDER — ADULT MULTIVITAMIN W/MINERALS CH: 1.0000 | ORAL_TABLET | Freq: Every day | ORAL | Status: DC

## 2017-01-12 MED ORDER — ENOXAPARIN SODIUM 40 MG/0.4ML ~~LOC~~ SOLN: 40.0000 mg | SUBCUTANEOUS | Status: DC

## 2017-01-12 MED ORDER — SENNOSIDES-DOCUSATE SODIUM 8.6-50 MG PO TABS
1.0000 | ORAL_TABLET | Freq: Every evening | ORAL | Status: DC | PRN
  Filled 2017-01-12: qty 1

## 2017-01-12 MED ORDER — SODIUM CHLORIDE 0.9 % IV SOLN: INTRAVENOUS | Status: DC

## 2017-01-12 MED ORDER — ACETAMINOPHEN 325 MG PO TABS: 650.0000 mg | ORAL_TABLET | ORAL | Status: DC | PRN

## 2017-01-12 MED ORDER — FOLIC ACID 1 MG PO TABS: 1.0000 mg | ORAL_TABLET | Freq: Every day | ORAL | Status: DC

## 2017-01-12 MED ORDER — ASPIRIN 81 MG PO CHEW
81.0000 mg | CHEWABLE_TABLET | Freq: Every day | ORAL | Status: DC
  Administered 2017-01-12: 81 mg via ORAL
  Filled 2017-01-12: qty 1

## 2017-01-12 NOTE — ED Notes (Signed)
Paged neurology regarding pt's diet status. Pt NPO, MRI negative, passed swallow screen. Neurology states to contact admitting to ensure they do not have any other plans that would require NPO. Paged admitting with no response.

## 2017-01-12 NOTE — H&P (Signed)
Family Medicine Teaching [facility] Admission History and Physical Service Pager: [phone]  Patient name: Mitchell [name] Medical record number: [id number] Date of birth: [birth year] Age: 56 y.o. Gender: male  Primary Care Provider: Patient, No Pcp Per Consultants: neuro Code Status: full  Chief Complaint: TIA symptoms  Assessment and Plan: Mitchell [name] is a 56 y.o. male  Who presents after having sudden onset bilateral visual blurriness and left arm "tingling" while straining to pick something up at work.  PMH is significant for MI, selfreported prior TIA, HTN, chronic back pain, self-reported arthritis, alcohol abuse.  TIA vs Stroke, new - Stroke symptoms include left arm "tingling" and sudden blurry vision bilaterally.   He says these symptoms have happened before and usually resolve in a few hours to a few days, he has not sought medical care for this in the past. Other prior symptoms include facial "tingling".   CT head neg for visible bleeds, MRI neg for acute but does show some chronic small vessel ischemia. BP to 160s/90s upon admission. Neurology consulted.  - admit as tele- obs under attending Dr. Jennette Kettle - vitals per floor protocol - neuro following; appreciate recs - monitor on telemetry - echo - carotid dopplers - risk strat labs: hgb A1C, lipid, TSH - swallow eval - PT/OT - continue statin - ASA 81 mg Qd - plavix 75 mg daily  HTN- home regimen lisinopril 5 mg. Stopped BP meds due to cost. -currently on permissive hypertension pending neuro/mri  CAD, hx of MI per patient - last lipid panel 2016 chol 134, Triglycerides 107, HDL 50, LDL 63. Home regimen includes Atorvastatin 20 mg and bisoprolol 5 mg at home. Endorses stopping medications 1 year ago due to cost. -will discuss most affordable options with pharm - transition to high intensity statin with Atorva 40 mg daily - ASA/Plavix as noted above  Abnormal EKG: ventricular trigeminy, ?L fascicular block.  Likely from previous MI. No indication of current MI. No chest pain today. Istat trop negative in ED - monitor on telemetry - no indication for ACS rule/out at this time, asymptomatic  Chronic low back pain, currently controlled - patient not currently taking prescribed meds - tylenol prn  Arthritis, currently well-controlled - diagnosis per patient, not currently taking meds - tylenol prn   Alcohol use - drinks 1/4 of a 5th of whiskey per night. States this is "his medication" because he cannot afford meds -CIWA checks with no ativan currently ordered, nurses to contact physician for CIWA >10 x2  FEN/GI: Patient NPO until swallow study Prophylaxis: lovanox  Disposition: Admit to telemetry  History of Present Illness:  Mitchell [name] is a 56 y.o. male presenting with sudden onset dizziness and left-sided numbness and tingling at 9-10am. He endorses simultaneous sudden onset vision change "like I'm seeing underwater" and he was unable to see clearly, which lasted until after he's gotten to the hospital, is now improving.  He endorses ongoing lip tingling. No facial droop, no changes in speech. He can still move the arm. No loss of consciousness.  He denies facial droop. No headache or histtory of seizures. No chest pain today. Felt dyspneic with this event, no palpitations. No diaphoresis. Endorses having nausea, no vomiting. No diarrhea, constipation. No recent fevers. No changes in urination.   He does endorse history of similar symptoms in the past, however he did not seek medical attention for these episodes. He reports that the last time, his arm stayed numb for almost 5 days.  He had similar symptoms right after his heart attack, symptoms at that time were attributed to heart attack. For chest pain, he does not note a pattern, and states it is intermittent and comes on with activity and with rest. Endorses intermittent light-headedness symptoms with straining.     He reports that he is  a never-smoker. Additionally he endorses a strong family history of heart attack before 51 - Dad, uncle, brother. Both grandmothers had a stroke.  For alcohol use, he endorses Whiskey 0.25 of a fifth per night.  Review Of Systems: Per HPI with the following additions:   ROS  Patient Active Problem List   Diagnosis Date Noted  . TIA (transient ischemic attack) 01/12/2017  . Chronic low back pain 03/11/2014  . Constipation 03/06/2014  . CAD (coronary artery disease), native coronary artery 03/04/2014  . Hyperlipidemia 03/04/2014  . Anxiety 03/04/2014  . History of acute inferior wall MI 02/28/2014    Past Medical History: Past Medical History:  Diagnosis Date  . CAD (coronary artery disease)    a. 02/2014 Inf STEMI/PCI: LM nl, LAD min irregs, Diags 1/2/3 min irregs, LCX min irregs, OM1/2/3 min irregs, RI min irregs, RCA 100p (3.5x18 Xience DES), PDA/PL nl, EF 45-50% basal inf HK.  Marland Kitchen History of acute inferior wall MI    Tx with DES to RCA  . Hyperlipidemia   . Intolerance of drug    dyspnea with Brilinta >> changed to Effient   . Ischemic cardiomyopathy    a. EF 45-50% with inf HK at Inf STEMI // b. Echo in 10/15 with EF 55-60% with inf and inf-lat HK  . NSVT (nonsustained ventricular tachycardia) (HCC)    a. in the setting of inferior STEMI;  b. 02/2014 Echo: EF 55-60%, Gr 1 DD, basal-midinferolateral/inf HK, Gr 1 DD, triv TR.    Past Surgical History: Past Surgical History:  Procedure Laterality Date  . CARDIAC CATHETERIZATION     02/28/2014 prox RCA single vessel occlusion treated with DES  . CORONARY ANGIOPLASTY WITH STENT PLACEMENT    . LEFT HEART CATH Bilateral 02/28/2014   Procedure: LEFT HEART CATH;  Surgeon: Iran Ouch, MD;  Location: [facility] CATH LAB;  Service: Cardiovascular;  Laterality: Bilateral;    Social History: Social History  Substance Use Topics  . Smoking status: Former Games developer  . Smokeless tobacco: Never Used  . Alcohol use 0.0 oz/week   Additional  social history:   Please also refer to relevant sections of EMR.  Family History: Family History  Problem Relation Age of Onset  . Hypertension Father   . Heart attack Brother 55   (If not completed, MUST add something in)  Allergies and Medications: No Known Allergies No current facility-administered medications on file prior to encounter.    Current Outpatient Prescriptions on File Prior to Encounter  Medication Sig Dispense Refill  . aspirin EC 81 MG tablet Take 81 mg by mouth daily.    Marland Kitchen atorvastatin (LIPITOR) 20 MG tablet Take 1 tablet (20 mg total) by mouth daily. 90 tablet 0  . bisoprolol (ZEBETA) 5 MG tablet Take 1 tablet (5 mg total) by mouth daily. 90 tablet 0  . clopidogrel (PLAVIX) 75 MG tablet Take 1 tablet (75 mg total) by mouth daily. 90 tablet 0  . lisinopril (PRINIVIL,ZESTRIL) 5 MG tablet Take 1 tablet (5 mg total) by mouth daily. 90 tablet 0  . nitroGLYCERIN (NITROSTAT) 0.4 MG SL tablet Place 1 tablet (0.4 mg total) under the tongue every 5 (  five) minutes as needed for chest pain. 25 tablet 3    Objective: BP (!) 164/91   Pulse 63   Resp 18   SpO2 100%  Exam: General: pleasant and conversational, NAD distress Eyes: PERRLA, EOMI, no conjunctival injection or pallor ENTM: no nasal discharge, mmm Cardiovascular: regular rhythm, regular rate, no murmurs noted Respiratory: CTA bilaterally, no wheezes, no visible IWB Gastrointestinal: soft belly with no TTP, bowel sounds throughout MSK:  significant cramping in lower legs bilaterally Derm: no lesions noted Neuro: 5/5 RUE strength, 4/5 LUE strength. 5/5 LE strenght bilaterally. CN II-XII intact. No dysmetria. Sensation equal UE and LE bil Psych: AOx3, appropriate  Labs and Imaging: CBC BMET   Recent Labs Lab 01/12/17 0954 01/12/17 1000  WBC 5.3  --   HGB 14.5 14.6  HCT 42.9 43.0  PLT 235  --     Recent Labs Lab 01/12/17 0954 01/12/17 1000  NA 140 142  K 4.2 4.2  CL 109 108  CO2 24  --   BUN 15  17  CREATININE 0.92 0.90  GLUCOSE 79 79  CALCIUM 9.3  --      EKG: ventricular trigeminy, ?L fascicular block  Mr [facility] Wo Contrast  IMPRESSION:  1. No acute intracranial abnormality.  2. Minimal chronic small vessel ischemic disease.  3. Negative head MRA.   Ct Head Code Stroke Wo Contrast Result Date: 01/12/2017  IMPRESSION:  1. Normal head CT    Marthenia Rolling, DO 01/12/2017, 3:07 PM PGY-1, [facility] Family Medicine FPTS Intern pager: [phone], text pages welcome  I saw the patient alongside Dr. Parke Simmers. I helped write and edited the above note and I agree with its contents in its edited form. Some of my additions are in [place]. Howard Pouch, MD PGY-2 [facility]

## 2017-01-12 NOTE — ED Triage Notes (Signed)
Pt here from work with c/o left face and arm numbness along with blurred vision , pt called a code stroke

## 2017-01-12 NOTE — ED Notes (Signed)
Pt in MRI.

## 2017-01-12 NOTE — Code Documentation (Signed)
56yo male arriving to [facility] via private vehicle at [phone].  Patient was LKW at 0800 when he went to work.  He reports bending over and experiencing dizziness and blurred vision.  Patient reporting left face and arm numbness on arrival to the ED.  Code stroke activated.  Patient to CT.  Stroke team responded to the bedside.  NIHSS 1, see documentation for details and code stroke times.  Patient with subjective left face and arm decreased sensation and blurred vision on exam.  Patient is too mild to treat at this time, however, remains in the window to treat with tPA until 1230 should symptoms worsen.  No acute stroke treatment at this time.  Bedside handoff with ED RN [place].

## 2017-01-12 NOTE — ED Provider Notes (Signed)
Emergency Department Provider Note   I have reviewed the triage vital signs and the nursing notes.   HISTORY  Chief Complaint Code Stroke   HPI Curran A Welte is a 56 y.o. male with PMH of CAD, HLD, NSVT, and CHF presents to the emergency department for evaluation of sudden onset left arm numbness and blurry vision. The patient was at work and pulling very hard on a piece of equipment when he suddenly had the above symptoms. Last normal approximately 9:30 AM. He reports history of left arm numbness in the past. He states he did not seek medical attention in those situations in his arm numbness resolved after approximately one week. He denies any severe neck pain. No fevers or chills. Denies chest pain or difficulty breathing. No LE weakness or numbness.    Past Medical History:  Diagnosis Date  . CAD (coronary artery disease)    a. 02/2014 Inf STEMI/PCI: LM nl, LAD min irregs, Diags 1/2/3 min irregs, LCX min irregs, OM1/2/3 min irregs, RI min irregs, RCA 100p (3.5x18 Xience DES), PDA/PL nl, EF 45-50% basal inf HK.  Marland Kitchen History of acute inferior wall MI    Tx with DES to RCA  . Hyperlipidemia   . Intolerance of drug    dyspnea with Brilinta >> changed to Effient   . Ischemic cardiomyopathy    a. EF 45-50% with inf HK at Inf STEMI // b. Echo in 10/15 with EF 55-60% with inf and inf-lat HK  . NSVT (nonsustained ventricular tachycardia) (HCC)    a. in the setting of inferior STEMI;  b. 02/2014 Echo: EF 55-60%, Gr 1 DD, basal-midinferolateral/inf HK, Gr 1 DD, triv TR.    Patient Active Problem List   Diagnosis Date Noted  . TIA (transient ischemic attack) 01/12/2017  . Chronic low back pain 03/11/2014  . Constipation 03/06/2014  . CAD (coronary artery disease), native coronary artery 03/04/2014  . Hyperlipidemia 03/04/2014  . Anxiety 03/04/2014  . History of acute inferior wall MI 02/28/2014    Past Surgical History:  Procedure Laterality Date  . CARDIAC CATHETERIZATION       02/28/2014 prox RCA single vessel occlusion treated with DES  . CORONARY ANGIOPLASTY WITH STENT PLACEMENT    . LEFT HEART CATH Bilateral 02/28/2014   Procedure: LEFT HEART CATH;  Surgeon: Iran Ouch, MD;  Location: [facility] CATH LAB;  Service: Cardiovascular;  Laterality: Bilateral;    Current Outpatient Rx  . Order #: [id number] Class: Historical Med  . Order #: [id number] Class: Normal  . Order #: [id number] Class: Normal  . Order #: [id number] Class: Normal  . Order #: [id number] Class: Normal  . Order #: [id number] Class: Normal    Allergies Patient has no known allergies.  Family History  Problem Relation Age of Onset  . Hypertension Father   . Heart attack Brother 43    Social History Social History  Substance Use Topics  . Smoking status: Former Games developer  . Smokeless tobacco: Never Used  . Alcohol use 0.0 oz/week    Review of Systems  Constitutional: No fever/chills Eyes: Positive blurred vision.  ENT: No sore throat. Cardiovascular: Denies chest pain. Respiratory: Denies shortness of breath. Gastrointestinal: No abdominal pain.  No nausea, no vomiting.  No diarrhea.  No constipation. Genitourinary: Negative for dysuria. Musculoskeletal: Negative for back pain. Skin: Negative for rash. Neurological: Negative for headaches. Positive LUE numbness and left face numbness.   10-point ROS otherwise negative.  ____________________________________________   PHYSICAL EXAM:  VITAL SIGNS: Vitals:   01/12/17  1330 01/12/17 1400  BP: (!) 142/91 (!) 145/87  Pulse: 62 63  Resp: 15 19  SpO2: 99% 100%    Constitutional: Alert and oriented. Well appearing and in no acute distress. Eyes: Conjunctivae are normal. PERRL. EOMI. Head: Atraumatic. Nose: No congestion/rhinnorhea. Mouth/Throat: Mucous membranes are moist.  Neck: No stridor.   Cardiovascular: Normal rate, regular rhythm. Good peripheral circulation. Grossly normal heart sounds.   Respiratory: Normal respiratory  effort.  No retractions. Lungs CTAB. Gastrointestinal: Soft and nontender. No distention.  Musculoskeletal: No lower extremity tenderness nor edema. No gross deformities of extremities. Neurologic:  Normal speech and language. Decreased sensation to light touch over the LUE and left face. No other CN deficits appreciated 2-12. Normal strength in upper and lower extremities.  Skin:  Skin is warm, dry and intact. No rash noted.  ____________________________________________   LABS (all labs ordered are listed, but only abnormal results are displayed)  Labs Reviewed  I-STAT CHEM 8, ED - Abnormal; Notable for the following:       Result Value   Calcium, Ion 1.06 (*)    All other components within normal limits  PROTIME-INR  APTT  CBC  DIFFERENTIAL  COMPREHENSIVE METABOLIC PANEL  I-STAT TROPONIN, ED  CBG MONITORING, ED   ____________________________________________  EKG   EKG Interpretation  Date/Time:  Friday January 12 2017 10:12:15 EDT Ventricular Rate:  63 PR Interval:    QRS Duration: 103 QT Interval:  395 QTC Calculation: 405 R Axis:   -80 Text Interpretation:  Sinus rhythm Ventricular trigeminy LAD, consider left anterior fascicular block No STEMI.  Confirmed by Alona Bene [phone]) on 01/12/2017 10:24:23 AM       ____________________________________________  RADIOLOGY  Ct Head Code Stroke Wo Contrast  Result Date: 01/12/2017 CLINICAL DATA:  Code stroke. Blurred vision in both eyes with tingling of the left arm. EXAM: CT HEAD WITHOUT CONTRAST TECHNIQUE: Contiguous axial images were obtained from the base of the skull through the vertex without intravenous contrast. COMPARISON:  04/10/2014 FINDINGS: Brain: Normal appearance without evidence of old or acute infarction, mass lesion, hemorrhage, hydrocephalus or extra-axial collection. Vascular: No abnormal vascular finding. Skull: Normal Sinuses/Orbits: Clear/ normal Other: None ASPECTS (Alberta Stroke Program Early CT  Score) - Ganglionic level infarction (caudate, lentiform nuclei, internal capsule, insula, M1-M3 cortex): 7 - Supraganglionic infarction (M4-M6 cortex): 3 Total score (0-10 with 10 being normal): 10 IMPRESSION: 1. Normal head CT 2. ASPECTS is 10. These results were called by telephone at the time of interpretation on 01/12/2017 at 10:13 am to Dr. Alona Bene , who verbally acknowledged these results. Electronically Signed   By: Paulina Fusi M.D.   On: 01/12/2017 10:14    ____________________________________________   PROCEDURES  Procedure(s) performed:   Procedures  CRITICAL CARE Performed by: Maia Plan Total critical care time: 30 minutes Critical care time was exclusive of separately billable procedures and treating other patients. Critical care was necessary to treat or prevent imminent or life-threatening deterioration. Critical care was time spent personally by me on the following activities: development of treatment plan with patient and/or surrogate as well as nursing, discussions with consultants, evaluation of patient's response to treatment, examination of patient, obtaining history from patient or surrogate, ordering and performing treatments and interventions, ordering and review of laboratory studies, ordering and review of radiographic studies, pulse oximetry and re-evaluation of patient's condition.  Alona Bene, MD Emergency Medicine  ____________________________________________   INITIAL IMPRESSION / ASSESSMENT AND PLAN / ED COURSE  Pertinent labs &  imaging results that were available during my care of the patient were reviewed by me and considered in my medical decision making (see chart for details).  Patient presents to the emergency department for evaluation of sudden onset blurry vision and left arm numbness. Patient appears to have some left face numbness as well. Code stroke activated. Patient has returned from CT. Radiology called to say CT scan is negative  for acute findings. Neurology at bedside.  tPA considered but not given with low NIH. Neurology following. Recommends MRI and CVA w/u.   Discussed patient's case with Family Medicine to request admission. Patient and family (if present) updated with plan. Care transferred to [facility] Medicine service.  I reviewed all nursing notes, vitals, pertinent old records, EKGs, labs, imaging (as available).  ____________________________________________  FINAL CLINICAL IMPRESSION(S) / ED DIAGNOSES  Final diagnoses:  Acute ischemic stroke (HCC)     MEDICATIONS GIVEN DURING THIS VISIT:  Medications  clopidogrel (PLAVIX) tablet 75 mg (75 mg Oral Given 01/12/17 1305)  aspirin chewable tablet 81 mg (81 mg Oral Given 01/12/17 1305)     NEW OUTPATIENT MEDICATIONS STARTED DURING THIS VISIT:  None   Note:  This document was prepared using Dragon voice recognition software and may include unintentional dictation errors.  Alona Bene, MD Emergency Medicine    Yadhira Mckneely, Arlyss Repress, MD 01/12/17 [phone]

## 2017-01-12 NOTE — ED Notes (Signed)
Patient transported to MRI 
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
## 2017-01-12 NOTE — Consult Note (Addendum)
Requesting Physician: Dr. Jacqulyn Bath    Chief Complaint: Blurred vision, left arm numbness  History obtained from:  Patient   HPI:                                                                                                                                      AARIN BLUETT is an 56 y.o. male with a past medical history significant for hypertension, hyperlipidemia, CAD presents to [facility] from work with complaints of blurry vision and decreased sensation in the left arm and face. He states that he was at work, straining to lift something when "bam!", he got lightheaded, developed blurry vision and decreased sensation on the left. He states that he has experienced these episodes several times over the past six months. When these events happen, he states that he goes home to "ride it out". He has never sought medical treatment for these episodes.  He does not take any medications. Denies illicit drug use but has 2-3 alcoholic drinks (liquor) per night.  Pertinent Imaging: CT head: Normal  Date last known well: Date: 01/12/2017 Time last known well: Time: 08:00  tPA Given: No: Mild symptoms  Modified Rankin Score: Rankin Score=0  Stroke Risk Factors - hyperlipidemia and hypertension   Past Medical History:  Diagnosis Date  . CAD (coronary artery disease)    a. 02/2014 Inf STEMI/PCI: LM nl, LAD min irregs, Diags 1/2/3 min irregs, LCX min irregs, OM1/2/3 min irregs, RI min irregs, RCA 100p (3.5x18 Xience DES), PDA/PL nl, EF 45-50% basal inf HK.  Marland Kitchen History of acute inferior wall MI    Tx with DES to RCA  . Hyperlipidemia   . Intolerance of drug    dyspnea with Brilinta >> changed to Effient   . Ischemic cardiomyopathy    a. EF 45-50% with inf HK at Inf STEMI // b. Echo in 10/15 with EF 55-60% with inf and inf-lat HK  . NSVT (nonsustained ventricular tachycardia) (HCC)    a. in the setting of inferior STEMI;  b. 02/2014 Echo: EF 55-60%, Gr 1 DD, basal-midinferolateral/inf HK, Gr 1 DD, triv  TR.    Past Surgical History:  Procedure Laterality Date  . CARDIAC CATHETERIZATION     02/28/2014 prox RCA single vessel occlusion treated with DES  . CORONARY ANGIOPLASTY WITH STENT PLACEMENT    . LEFT HEART CATH Bilateral 02/28/2014   Procedure: LEFT HEART CATH;  Surgeon: Iran Ouch, MD;  Location: [facility] CATH LAB;  Service: Cardiovascular;  Laterality: Bilateral;    Family History  Problem Relation Age of Onset  . Hypertension Father   . Heart attack Brother 51     reports that he has quit smoking. He has never used smokeless tobacco. He reports that he drinks alcohol. He reports that he does not use drugs.  No Known Allergies  Medications:  No outpatient prescriptions have been marked as taking for the 01/12/17 encounter [facility] Encounter).    Review Of Systems:                                                                                                           History obtained from the patient  General: Negative for fever Psychological: Negative for any known behavioral disorder Ophthalmic: As above ENT: Negative for vertigo Respiratory: Negative for cough, shortness of breath  Cardiovascular: Negative for chest pain, irregular heartbeat Gastrointestinal: Negative for abdominal pain Musculoskeletal: Negative for joint swelling or muscular weakness Neurological: As noted in HPI Dermatological: As above  Blood pressure (!) 144/90, pulse 71, resp. rate 19, SpO2 100 %.   Physical Examination:                                                                                                      General: WDWN male.  HEENT:  Normocephalic, no lesions, without obvious abnormality.  Normal external eye and conjunctiva.  Normal external ears. Normal external nose, mucus membranes and septum.  Normal pharynx. Cardiovascular: regular rate and rhythm,  pulses palpable throughout   Pulmonary: Unlabored breathing Abdomen: Soft, non-tender Extremities: no joint deformities, effusion, or inflammation Musculoskeletal: no joint tenderness, deformity or swelling  Skin: warm and dry, no hyperpigmentation, vitiligo, or suspicious lesions  Neurological Examination:                                                                                               Mental Status: Linden A Domagalski is alert, oriented, thought content appropriate.  Speech fluent without evidence of aphasia. Able to follow 3-step commands without difficulty. Cranial Nerves: II: Visual fields grossly normal, pupils are equal, round, reactive to light. III,IV, VI: Ptosis not present, extra-ocular muscle movements intact bilaterally V,VII: Smile and eyebrow raise is symmetric. Facial light touch and pinprick sensation reduced on the left VIII: Hearing grossly intact IX,X: Uvula and palate rise symmetrically XI: SCM and bilateral shoulder shrug strength 5/5 XII: Midline tongue extension Motor: Right :     Upper extremity   5/5   Left:     Upper extremity   5/5          Lower extremity  5/5    Lower extremity   5/5 Pronator drift not present Sensory: Pinprick and light touch reduced on the left arm, intact on the right arm and BLE Deep Tendon Reflexes: 2+ and symmetric throughout Plantars: Right: downgoing   Left: downgoing Cerebellar: Finger-to-nose test without evidence of dysmetria. Heel-to-shin test executed within normal limits. Gait: Per report, gait is "wobbly". Deferred for my exam.  Lab Results: Basic Metabolic Panel:  Recent Labs Lab 01/12/17 0954 01/12/17 1000  NA 140 142  K 4.2 4.2  CL 109 108  CO2 24  --   GLUCOSE 79 79  BUN 15 17  CREATININE 0.92 0.90  CALCIUM 9.3  --     Liver Function Tests:  Recent Labs Lab 01/12/17 0954  AST 24  ALT 24  ALKPHOS 81  BILITOT 0.5  PROT 7.1  ALBUMIN 3.9   No results for input(s): LIPASE, AMYLASE in the  last 168 hours. No results for input(s): AMMONIA in the last 168 hours.  CBC:  Recent Labs Lab 01/12/17 0954 01/12/17 1000  WBC 5.3  --   NEUTROABS 3.1  --   HGB 14.5 14.6  HCT 42.9 43.0  MCV 95.3  --   PLT 235  --     Cardiac Enzymes: No results for input(s): CKTOTAL, CKMB, CKMBINDEX, TROPONINI in the last 168 hours.  Lipid Panel: No results for input(s): CHOL, TRIG, HDL, CHOLHDL, VLDL, LDLCALC in the last 168 hours.  CBG: No results for input(s): GLUCAP in the last 168 hours.  Microbiology: Results for orders placed or performed during the hospital encounter of 04/03/14  Urine culture     Status: None   Collection Time: 04/03/14 10:21 AM  Result Value Ref Range Status   Specimen Description URINE, RANDOM  Final   Special Requests NONE  Final   Culture  Setup Time   Final    04/03/2014 16:36 Performed at [facility]    Colony Count   Final    3,000 COLONIES/ML Performed at [facility]    Culture   Final    INSIGNIFICANT GROWTH Performed at [facility]    Report Status 04/04/2014 FINAL  Final    Coagulation Studies:  Recent Labs  01/12/17 0954  LABPROT 12.5  INR 0.93    Imaging: Ct Head Code Stroke Wo Contrast  Result Date: 01/12/2017 CLINICAL DATA:  Code stroke. Blurred vision in both eyes with tingling of the left arm. EXAM: CT HEAD WITHOUT CONTRAST TECHNIQUE: Contiguous axial images were obtained from the base of the skull through the vertex without intravenous contrast. COMPARISON:  04/10/2014 FINDINGS: Brain: Normal appearance without evidence of old or acute infarction, mass lesion, hemorrhage, hydrocephalus or extra-axial collection. Vascular: No abnormal vascular finding. Skull: Normal Sinuses/Orbits: Clear/ normal Other: None ASPECTS (Alberta Stroke Program Early CT Score) - Ganglionic level infarction (caudate, lentiform nuclei, internal capsule, insula, M1-M3 cortex): 7 - Supraganglionic infarction (M4-M6 cortex): 3  Total score (0-10 with 10 being normal): 10 IMPRESSION: 1. Normal head CT 2. ASPECTS is 10. These results were called by telephone at the time of interpretation on 01/12/2017 at 10:13 am to Dr. Alona Bene , who verbally acknowledged these results. Electronically Signed   By: Paulina Fusi M.D.   On: 01/12/2017 10:14    Assessment  Mr. Boldon is a 56 year old man with a past medical history significant for hypertension and hyperlipidemia.  He presents to [facility] with complains of blurry vision and left arm and facial  weakness that has been recurring over the past several months. CT negative.  Top of the differential would be a small vessel stroke.  He stated that he does not have insurance and was reluctant to stay in the hospital for an extended period of time. He is amenable to treatment at this time. Social work input may be appreciated.  Plan:  1) Stroke workup:  1. HgbA1c, fasting lipid panel  2. MRI, MRA  of the brain without contrast  3. PT consult, OT consult, Speech consult  4. Echocardiogram  5. Carotid dopplers  6. Prophylactic therapy-Antiplatelet med: Aspirin - dose 81; Plavix 75mg   7. Risk factor modification  8. Telemetry monitoring  9. Frequent neuro checks  10 NPO until passes stroke swallow screen 2) The stroke team will follow Mr. Derosia beginning 01/13/17  Leonel Ramsay PA-C [facility] [phone]  01/12/2017, 11:46 AM Attending addendum I have seen and evaluated the patient as an acute code stroke in the emergency room. Briefly, 56 year old with a past medical history of hypertension hyper lipidemia coronary artery disease and chronic back pain came in with acute onset of blurred vision and decreased sensation on the left face and arm. NIH stroke scale-1 for subjective left sensory deficit. Has had similar episodes more than once in the last few months, and decided not to come to the hospital for financial reasons and the symptoms resolved in a few  days. I would work him up for a stroke/TIA-small vessel versus embolic to posterior circulation given the symptoms. Not a candidate for TPA-low NIH stroke scale of 1. Not a candidate for endovascular treatment-no cortical signs. I agree with the history and physical as documented above. I help formulate the plan that is documented above. I have reviewed the note above and the necessary edits.  Milon Dikes, MD [facility] [phone]  If 7pm to 7am, please call on call as listed on AMION.

## 2017-01-12 NOTE — ED Notes (Signed)
Pt states to Transport tech that he is going to leave. RN to room, pt informed that admitting was paged. Pt insisting on leaving, stating that he is hungry and has been waiting all day. RN offered to get ED MD and Charge RN to bedside. Pt refused. Pt removed his IV and walked out of room and left the ED.

## 2017-01-16 ENCOUNTER — Other Ambulatory Visit: Payer: Self-pay | Admitting: Cardiology

## 2017-01-16 DIAGNOSIS — E785 Hyperlipidemia, unspecified: Secondary | ICD-10-CM

## 2017-01-16 DIAGNOSIS — I2510 Atherosclerotic heart disease of native coronary artery without angina pectoris: Secondary | ICD-10-CM

## 2017-02-02 ENCOUNTER — Emergency Department (HOSPITAL_COMMUNITY)
Admission: EM | Admit: 2017-02-02 | Discharge: 2017-02-02 | Disposition: A | Discharge status: Home / Self Care | Payer: Self-pay | Attending: Emergency Medicine | Admitting: Emergency Medicine

## 2017-02-02 ENCOUNTER — Encounter (HOSPITAL_COMMUNITY): Payer: Self-pay | Admitting: Family Medicine

## 2017-02-02 DIAGNOSIS — R202 Paresthesia of skin: Secondary | ICD-10-CM | POA: Insufficient documentation

## 2017-02-02 DIAGNOSIS — R42 Dizziness and giddiness: Secondary | ICD-10-CM

## 2017-02-02 DIAGNOSIS — Z8673 Personal history of transient ischemic attack (TIA), and cerebral infarction without residual deficits: Secondary | ICD-10-CM | POA: Insufficient documentation

## 2017-02-02 DIAGNOSIS — Z7982 Long term (current) use of aspirin: Secondary | ICD-10-CM | POA: Insufficient documentation

## 2017-02-02 DIAGNOSIS — Z87891 Personal history of nicotine dependence: Secondary | ICD-10-CM | POA: Insufficient documentation

## 2017-02-02 DIAGNOSIS — I2510 Atherosclerotic heart disease of native coronary artery without angina pectoris: Secondary | ICD-10-CM | POA: Insufficient documentation

## 2017-02-02 DIAGNOSIS — Z79899 Other long term (current) drug therapy: Secondary | ICD-10-CM | POA: Insufficient documentation

## 2017-02-02 DIAGNOSIS — I252 Old myocardial infarction: Secondary | ICD-10-CM | POA: Insufficient documentation

## 2017-02-02 LAB — CBC
HCT: 43.8 % (ref 39.0–52.0)
Hemoglobin: 15.4 g/dL (ref 13.0–17.0)
MCH: 32.9 pg (ref 26.0–34.0)
MCHC: 35.2 g/dL (ref 30.0–36.0)
MCV: 93.6 fL (ref 78.0–100.0)
PLATELETS: 213 10*3/uL (ref 150–400)
RBC: 4.68 MIL/uL (ref 4.22–5.81)
RDW: 12.1 % (ref 11.5–15.5)
WBC: 5.4 10*3/uL (ref 4.0–10.5)

## 2017-02-02 LAB — COMPREHENSIVE METABOLIC PANEL
ALBUMIN: 4.2 g/dL (ref 3.5–5.0)
ALT: 17 U/L (ref 17–63)
ANION GAP: 6 (ref 5–15)
AST: 20 U/L (ref 15–41)
Alkaline Phosphatase: 93 U/L (ref 38–126)
BUN: 10 mg/dL (ref 6–20)
CALCIUM: 9.2 mg/dL (ref 8.9–10.3)
CHLORIDE: 108 mmol/L (ref 101–111)
CO2: 22 mmol/L (ref 22–32)
CREATININE: 0.89 mg/dL (ref 0.61–1.24)
GFR calc Af Amer: >60 mL/min (ref >60)
GFR calc non Af Amer: >60 mL/min (ref >60)
Glucose, Bld: 102 mg/dL — ABNORMAL HIGH (ref 65–99)
Potassium: 4 mmol/L (ref 3.5–5.1)
SODIUM: 136 mmol/L (ref 135–145)
Total Bilirubin: 0.9 mg/dL (ref 0.3–1.2)
Total Protein: 7.1 g/dL (ref 6.5–8.1)

## 2017-02-02 LAB — I-STAT TROPONIN, ED: TROPONIN I, POC: 0 ng/mL (ref 0.00–0.08)

## 2017-02-02 LAB — I-STAT CHEM 8, ED
BUN: 12 mg/dL (ref 6–20)
CALCIUM ION: 1.14 mmol/L — AB (ref 1.15–1.40)
CHLORIDE: 106 mmol/L (ref 101–111)
Creatinine, Ser: 0.8 mg/dL (ref 0.61–1.24)
GLUCOSE: 101 mg/dL — AB (ref 65–99)
HCT: 45 % (ref 39.0–52.0)
HEMOGLOBIN: 15.3 g/dL (ref 13.0–17.0)
Potassium: 4.1 mmol/L (ref 3.5–5.1)
SODIUM: 140 mmol/L (ref 135–145)
TCO2: 23 mmol/L (ref 22–32)

## 2017-02-02 LAB — DIFFERENTIAL
Basophils Absolute: 0 10*3/uL (ref 0.0–0.1)
Basophils Relative: 1 %
Eosinophils Absolute: 0.3 10*3/uL (ref 0.0–0.7)
Eosinophils Relative: 5 %
LYMPHS PCT: 31 %
Lymphs Abs: 1.7 10*3/uL (ref 0.7–4.0)
Monocytes Absolute: 0.3 10*3/uL (ref 0.1–1.0)
Monocytes Relative: 6 %
NEUTROS ABS: 3.1 10*3/uL (ref 1.7–7.7)
NEUTROS PCT: 57 %

## 2017-02-02 LAB — PROTIME-INR
INR: 0.95
PROTHROMBIN TIME: 12.6 s (ref 11.4–15.2)

## 2017-02-02 LAB — APTT: aPTT: 28 seconds (ref 24–36)

## 2017-02-02 NOTE — ED Triage Notes (Signed)
Pt here for continued dizziness, blurred vision, left arm tingling, left foot tingling, bilateral calf pain. sts that the other night he had an episode of chest pain and SOB. All of this has been ongoing since he was here previously. sts he hasn't been on his medications for a while do to insurance and loss of employment.

## 2017-02-02 NOTE — ED Provider Notes (Signed)
[facility]-EMERGENCY DEPT Provider Note   CSN: [id number] Arrival date & time: 02/02/17  0932     History   Chief Complaint Chief Complaint  Patient presents with  . Dizziness  . Numbness  . Tingling    HPI Mitchell [name] is a 56 y.o. male.  The history is provided by the patient and medical records.    56 year old male with history of coronary artery disease status post acute inferior wall MI, ischemic cardiomyopathy, HLP, presenting to the ED for various symptoms. Patient states since he was seen in the ED on 01/12/2017 his x-rays continued dizziness, paresthesias of his left arm and leg, blurred vision, and some intermittent episodes of confusion. States mostly has paresthesias of the left arm from elbow down and in the left foot. Has also had some calf pain for several months now. States his most recent episode was severe when he was at [place] doing his grocery shopping. States he was pushing his car to the eyelid when he became very disoriented, stopped in the middle of the aisle for about 15 minutes and could not remember where he was or what was going on.  States this improved and he is ultimately able to drive himself home. States when walking he feels very unsteady like he is "swaying". He denies any falls or syncopal events. States sometimes when sitting still he feels like things around him are moving and he gets nauseated and his vision is blurred. States he thought at one point he may have vertigo, but did not feel that his other symptoms could be attributed to that. States it is getting to the point where he is having a hard time living alone and completing his regular daily tasks. He denies any new head injury. Has had some mild headaches but no fever or neck pain. No other infectious type symptoms.  Patient was able to drive himself to the ED today.  Past Medical History:  Diagnosis Date  . CAD (coronary artery disease)    a. 02/2014 Inf STEMI/PCI: LM nl, LAD min irregs,  Diags 1/2/3 min irregs, LCX min irregs, OM1/2/3 min irregs, RI min irregs, RCA 100p (3.5x18 Xience DES), PDA/PL nl, EF 45-50% basal inf HK.  Marland Kitchen History of acute inferior wall MI    Tx with DES to RCA  . Hyperlipidemia   . Intolerance of drug    dyspnea with Brilinta >> changed to Effient   . Ischemic cardiomyopathy    a. EF 45-50% with inf HK at Inf STEMI // b. Echo in 10/15 with EF 55-60% with inf and inf-lat HK  . NSVT (nonsustained ventricular tachycardia) (HCC)    a. in the setting of inferior STEMI;  b. 02/2014 Echo: EF 55-60%, Gr 1 DD, basal-midinferolateral/inf HK, Gr 1 DD, triv TR.    Patient Active Problem List   Diagnosis Date Noted  . TIA (transient ischemic attack) 01/12/2017  . Chronic low back pain 03/11/2014  . Constipation 03/06/2014  . CAD (coronary artery disease), native coronary artery 03/04/2014  . Hyperlipidemia 03/04/2014  . Anxiety 03/04/2014  . History of acute inferior wall MI 02/28/2014    Past Surgical History:  Procedure Laterality Date  . CARDIAC CATHETERIZATION     02/28/2014 prox RCA single vessel occlusion treated with DES  . CORONARY ANGIOPLASTY WITH STENT PLACEMENT    . LEFT HEART CATH Bilateral 02/28/2014   Procedure: LEFT HEART CATH;  Surgeon: Iran Ouch, MD;  Location: [facility] CATH LAB;  Service: Cardiovascular;  Laterality:  Bilateral;       Home Medications    Prior to Admission medications   Medication Sig Start Date End Date Taking? Authorizing Provider  aspirin EC 81 MG tablet Take 81 mg by mouth daily.    [provider]  atorvastatin (LIPITOR) 20 MG tablet Take 1 tablet (20 mg total) by mouth daily. 02/14/16   Laurey Morale, MD  bisoprolol (ZEBETA) 5 MG tablet Take 1 tablet (5 mg total) by mouth daily. 02/14/16   Laurey Morale, MD  clopidogrel (PLAVIX) 75 MG tablet Take 1 tablet (75 mg total) by mouth daily. 02/14/16   Laurey Morale, MD  lisinopril (PRINIVIL,ZESTRIL) 5 MG tablet Take 1 tablet (5 mg total) by mouth  daily. 02/14/16   Laurey Morale, MD  nitroGLYCERIN (NITROSTAT) 0.4 MG SL tablet Place 1 tablet (0.4 mg total) under the tongue every 5 (five) minutes as needed for chest pain. 01/11/15   Laurey Morale, MD    Family History Family History  Problem Relation Age of Onset  . Hypertension Father   . Heart attack Brother 1    Social History Social History  Substance Use Topics  . Smoking status: Former Games developer  . Smokeless tobacco: Never Used  . Alcohol use 0.0 oz/week     Allergies   Patient has no known allergies.   Review of Systems Review of Systems  Neurological: Positive for dizziness, light-headedness, numbness and headaches.  All other systems reviewed and are negative.    Physical Exam Updated Vital Signs BP (!) 148/101   Pulse 68   Temp 97.9 F (36.6 C)   Resp 18   SpO2 99%   Physical Exam  Constitutional: He is oriented to person, place, and time. He appears well-developed and well-nourished.  HENT:  Head: Normocephalic and atraumatic.  Mouth/Throat: Oropharynx is clear and moist.  Eyes: Pupils are equal, round, and reactive to light. Conjunctivae and EOM are normal.  Neck: Normal range of motion.  Cardiovascular: Normal rate, regular rhythm and normal heart sounds.   Pulmonary/Chest: Effort normal and breath sounds normal.  Abdominal: Soft. Bowel sounds are normal.  Musculoskeletal: Normal range of motion.  Neurological: He is alert and oriented to person, place, and time.  AAOx3, answering questions and following commands appropriately; equal strength UE and LE bilaterally; CN grossly intact; moves all extremities appropriately without ataxia; heel to chin and finger to nose normal bilaterally; no pronator drift; no focal neuro deficits or facial asymmetry appreciated, speech is clear and goal oriented  Skin: Skin is warm and dry.  Psychiatric: He has a normal mood and affect.  Nursing note and vitals reviewed.    ED Treatments / Results   Labs (all labs ordered are listed, but only abnormal results are displayed) Labs Reviewed  COMPREHENSIVE METABOLIC PANEL - Abnormal; Notable for the following:       Result Value   Glucose, Bld 102 (*)    All other components within normal limits  I-STAT CHEM 8, ED - Abnormal; Notable for the following:    Glucose, Bld 101 (*)    Calcium, Ion 1.14 (*)    All other components within normal limits  PROTIME-INR  APTT  CBC  DIFFERENTIAL  I-STAT TROPONIN, ED    EKG  EKG Interpretation None       Radiology No results found.  Procedures Procedures (including critical care time)  Medications Ordered in ED Medications - No data to display   Initial Impression / Assessment and  Plan / ED Course  I have reviewed the triage vital signs and the nursing notes.  Pertinent labs & imaging results that were available during my care of the patient were reviewed by me and considered in my medical decision making (see chart for details).  56 year old male here with continued symptoms of dizziness, left arm paresthesias, left foot paresthesias, dizziness. Seen in the ED on 01/12/2017 for same. Was to be admitted for stroke workup but left AMA after his MRI/MRA which was negative.  States symptoms have persisted since this time along with some periods of transient confusion that resolved spontaneously. He has not had any falls or syncopal events. Neurologically on exam, I do not appreciate any focal deficits.  His thought process is clear and concise. He is able to give adequate history. Screening lab work overall reassuring.   Discussed with hospitalist, Dr. Otelia Limes-- constellation of symptoms not necessarily following particular distribution or pattern concerning for stroke.  Given that he had recent MRI/MRA that was negative without acute change in symptoms, does not feel repeat MRI/MRA indicated emergently.  Recommends to complete remainder of his work-up (echocardiogram, carotid dopplers,  etc) can be done as an outpatient.  Plan was discussed with patient, he is comfortable with this. I've made ambulatory referral to neurology clinic for follow-up. He understands return here for any new or worsened symptoms. Patient discharged home in stable condition.  Final Clinical Impressions(s) / ED Diagnoses   Final diagnoses:  Tingling  Dizziness    New Prescriptions Discharge Medication List as of 02/02/2017 12:18 PM       Garlon Hatchet, PA-C 02/02/17 1304    Jacalyn Lefevre, MD 02/02/17 [phone]

## 2017-02-02 NOTE — Discharge Instructions (Signed)
As we discussed, your lab work today was normal. Our neurology team feels it is safe for you to follow-up as an outpatient.  I have placed ambulatory referral to the [facility] to get you some follow-up as soon as possible. Should your symptoms change/worsen, you need to come back to the ED.

## 2017-06-01 ENCOUNTER — Other Ambulatory Visit: Payer: Self-pay

## 2017-06-01 ENCOUNTER — Encounter (HOSPITAL_COMMUNITY): Payer: Self-pay | Admitting: Emergency Medicine

## 2017-06-01 ENCOUNTER — Inpatient Hospital Stay (HOSPITAL_COMMUNITY)
Admission: EM | Admit: 2017-06-01 | Discharge: 2017-06-02 | DRG: 303 | Disposition: A | Discharge status: Home / Self Care | Payer: Self-pay | Attending: Cardiovascular Disease | Admitting: Cardiovascular Disease

## 2017-06-01 ENCOUNTER — Emergency Department (HOSPITAL_COMMUNITY): Payer: Self-pay

## 2017-06-01 DIAGNOSIS — R0789 Other chest pain: Secondary | ICD-10-CM | POA: Diagnosis present

## 2017-06-01 DIAGNOSIS — I10 Essential (primary) hypertension: Secondary | ICD-10-CM | POA: Diagnosis present

## 2017-06-01 DIAGNOSIS — Z7902 Long term (current) use of antithrombotics/antiplatelets: Secondary | ICD-10-CM

## 2017-06-01 DIAGNOSIS — I252 Old myocardial infarction: Secondary | ICD-10-CM

## 2017-06-01 DIAGNOSIS — R072 Precordial pain: Secondary | ICD-10-CM

## 2017-06-01 DIAGNOSIS — I255 Ischemic cardiomyopathy: Secondary | ICD-10-CM | POA: Diagnosis present

## 2017-06-01 DIAGNOSIS — Z87891 Personal history of nicotine dependence: Secondary | ICD-10-CM

## 2017-06-01 DIAGNOSIS — Z955 Presence of coronary angioplasty implant and graft: Secondary | ICD-10-CM

## 2017-06-01 DIAGNOSIS — R079 Chest pain, unspecified: Secondary | ICD-10-CM

## 2017-06-01 DIAGNOSIS — Z8673 Personal history of transient ischemic attack (TIA), and cerebral infarction without residual deficits: Secondary | ICD-10-CM

## 2017-06-01 DIAGNOSIS — Z8249 Family history of ischemic heart disease and other diseases of the circulatory system: Secondary | ICD-10-CM

## 2017-06-01 DIAGNOSIS — I25110 Atherosclerotic heart disease of native coronary artery with unstable angina pectoris: Secondary | ICD-10-CM

## 2017-06-01 DIAGNOSIS — Z7982 Long term (current) use of aspirin: Secondary | ICD-10-CM

## 2017-06-01 DIAGNOSIS — I2510 Atherosclerotic heart disease of native coronary artery without angina pectoris: Principal | ICD-10-CM | POA: Diagnosis present

## 2017-06-01 DIAGNOSIS — E7800 Pure hypercholesterolemia, unspecified: Secondary | ICD-10-CM

## 2017-06-01 DIAGNOSIS — E785 Hyperlipidemia, unspecified: Secondary | ICD-10-CM | POA: Diagnosis present

## 2017-06-01 HISTORY — DX: Chest pain, unspecified: R07.9

## 2017-06-01 LAB — BASIC METABOLIC PANEL
Anion gap: 10 (ref 5–15)
BUN: 19 mg/dL (ref 6–20)
CHLORIDE: 105 mmol/L (ref 101–111)
CO2: 22 mmol/L (ref 22–32)
CREATININE: 0.95 mg/dL (ref 0.61–1.24)
Calcium: 9.3 mg/dL (ref 8.9–10.3)
GFR calc Af Amer: >60 mL/min (ref >60)
GFR calc non Af Amer: >60 mL/min (ref >60)
GLUCOSE: 117 mg/dL — AB (ref 65–99)
Potassium: 4 mmol/L (ref 3.5–5.1)
SODIUM: 137 mmol/L (ref 135–145)

## 2017-06-01 LAB — HEPATIC FUNCTION PANEL
ALBUMIN: 4.2 g/dL (ref 3.5–5.0)
ALT: 20 U/L (ref 17–63)
AST: 30 U/L (ref 15–41)
Alkaline Phosphatase: 101 U/L (ref 38–126)
Bilirubin, Direct: 0.3 mg/dL (ref 0.1–0.5)
Indirect Bilirubin: 0.8 mg/dL (ref 0.3–0.9)
Total Bilirubin: 1.1 mg/dL (ref 0.3–1.2)
Total Protein: 7.4 g/dL (ref 6.5–8.1)

## 2017-06-01 LAB — I-STAT TROPONIN, ED
Troponin i, poc: 0 ng/mL (ref 0.00–0.08)
Troponin i, poc: 0.01 ng/mL (ref 0.00–0.08)

## 2017-06-01 LAB — TROPONIN I
Troponin I: <0.03 ng/mL (ref <0.03)
Troponin I: <0.03 ng/mL (ref <0.03)

## 2017-06-01 LAB — CBC
HEMATOCRIT: 43.7 % (ref 39.0–52.0)
Hemoglobin: 15.1 g/dL (ref 13.0–17.0)
MCH: 32.9 pg (ref 26.0–34.0)
MCHC: 34.6 g/dL (ref 30.0–36.0)
MCV: 95.2 fL (ref 78.0–100.0)
PLATELETS: 215 10*3/uL (ref 150–400)
RBC: 4.59 MIL/uL (ref 4.22–5.81)
RDW: 12.5 % (ref 11.5–15.5)
WBC: 6.2 10*3/uL (ref 4.0–10.5)

## 2017-06-01 LAB — MAGNESIUM: Magnesium: 2.2 mg/dL (ref 1.7–2.4)

## 2017-06-01 LAB — TSH: TSH: 1.429 u[IU]/mL (ref 0.350–4.500)

## 2017-06-01 LAB — HEMOGLOBIN A1C
Hgb A1c MFr Bld: 5 % (ref 4.8–5.6)
MEAN PLASMA GLUCOSE: 96.8 mg/dL

## 2017-06-01 LAB — T4, FREE: FREE T4: 0.92 ng/dL (ref 0.61–1.12)

## 2017-06-01 MED ORDER — NITROGLYCERIN 0.4 MG SL SUBL: 0.4000 mg | SUBLINGUAL_TABLET | SUBLINGUAL | Status: DC | PRN

## 2017-06-01 MED ORDER — FAMOTIDINE 20 MG PO TABS
20.0000 mg | ORAL_TABLET | Freq: Two times a day (BID) | ORAL | Status: DC
  Administered 2017-06-01 – 2017-06-02 (×2): 20 mg via ORAL
  Filled 2017-06-01 (×2): qty 1

## 2017-06-01 MED ORDER — TRAMADOL HCL 50 MG PO TABS
50.0000 mg | ORAL_TABLET | Freq: Four times a day (QID) | ORAL | Status: DC | PRN
  Administered 2017-06-01: 50 mg via ORAL
  Filled 2017-06-01: qty 1

## 2017-06-01 MED ORDER — ASPIRIN 81 MG PO CHEW
324.0000 mg | CHEWABLE_TABLET | Freq: Once | ORAL | Status: AC
  Administered 2017-06-01: 324 mg via ORAL
  Filled 2017-06-01: qty 4

## 2017-06-01 MED ORDER — CARVEDILOL 12.5 MG PO TABS
6.2500 mg | ORAL_TABLET | Freq: Two times a day (BID) | ORAL | Status: DC
  Administered 2017-06-01 – 2017-06-02 (×2): 6.25 mg via ORAL
  Filled 2017-06-01 (×2): qty 1

## 2017-06-01 MED ORDER — FAMOTIDINE 20 MG PO TABS
20.0000 mg | ORAL_TABLET | Freq: Once | ORAL | Status: AC
  Administered 2017-06-01: 20 mg via ORAL
  Filled 2017-06-01: qty 1

## 2017-06-01 MED ORDER — ASPIRIN EC 81 MG PO TBEC
81.0000 mg | DELAYED_RELEASE_TABLET | Freq: Every day | ORAL | Status: DC
  Administered 2017-06-02: 81 mg via ORAL

## 2017-06-01 MED ORDER — ASPIRIN EC 81 MG PO TBEC
81.0000 mg | DELAYED_RELEASE_TABLET | Freq: Every day | ORAL | Status: DC
  Filled 2017-06-01 (×2): qty 1

## 2017-06-01 MED ORDER — ALPRAZOLAM 0.25 MG PO TABS: 0.2500 mg | ORAL_TABLET | Freq: Two times a day (BID) | ORAL | Status: DC | PRN

## 2017-06-01 MED ORDER — ATORVASTATIN CALCIUM 40 MG PO TABS: 40.0000 mg | ORAL_TABLET | Freq: Every day | ORAL | Status: DC

## 2017-06-01 MED ORDER — LISINOPRIL 2.5 MG PO TABS
5.0000 mg | ORAL_TABLET | Freq: Every day | ORAL | Status: DC
  Administered 2017-06-01 – 2017-06-02 (×2): 5 mg via ORAL
  Filled 2017-06-01: qty 2
  Filled 2017-06-01: qty 1

## 2017-06-01 MED ORDER — NITROGLYCERIN 2 % TD OINT
1.0000 [in_us] | TOPICAL_OINTMENT | Freq: Three times a day (TID) | TRANSDERMAL | Status: DC
  Administered 2017-06-01 (×2): 1 [in_us] via TOPICAL
  Filled 2017-06-01 (×3): qty 1

## 2017-06-01 MED ORDER — ZOLPIDEM TARTRATE 5 MG PO TABS: 5.0000 mg | ORAL_TABLET | Freq: Every evening | ORAL | Status: DC | PRN

## 2017-06-01 MED ORDER — ONDANSETRON HCL 4 MG/2ML IJ SOLN: 4.0000 mg | Freq: Four times a day (QID) | INTRAMUSCULAR | Status: DC | PRN

## 2017-06-01 MED ORDER — ACETAMINOPHEN 325 MG PO TABS: 650.0000 mg | ORAL_TABLET | ORAL | Status: DC | PRN

## 2017-06-01 MED ORDER — HEPARIN SODIUM (PORCINE) 5000 UNIT/ML IJ SOLN
5000.0000 [IU] | Freq: Three times a day (TID) | INTRAMUSCULAR | Status: DC
  Administered 2017-06-01 – 2017-06-02 (×2): 5000 [IU] via SUBCUTANEOUS
  Filled 2017-06-01 (×2): qty 1

## 2017-06-01 MED ORDER — ACETAMINOPHEN 500 MG PO TABS
1000.0000 mg | ORAL_TABLET | Freq: Once | ORAL | Status: DC
  Filled 2017-06-01: qty 2

## 2017-06-01 NOTE — ED Notes (Signed)
Ordered dinner tray.  

## 2017-06-01 NOTE — ED Provider Notes (Addendum)
[facility] EMERGENCY DEPARTMENT Provider Note   CSN: [id number] Arrival date & time: 06/01/17  1209     History   Chief Complaint Chief Complaint  Patient presents with  . Chest Pain    HPI Mitchell [name] is a 57 y.o. male.  Patient c/o mid chest pain intermittently since last night. Patient states dull pain mid to left chest, worse w exertion, improved w rest. Lasts 5-10 minutes. States had MI/stent a couple years ago and feels somewhat similar. No constant and/or pleuritic pain. No sob, nv or diaphoresis. Denies cough or uri c/o. No fever or chills.    The history is provided by the patient.  Chest Pain   Pertinent negatives include no abdominal pain, no back pain, no fever, no headaches and no shortness of breath.    Past Medical History:  Diagnosis Date  . CAD (coronary artery disease)    a. 02/2014 Inf STEMI/PCI: LM nl, LAD min irregs, Diags 1/2/3 min irregs, LCX min irregs, OM1/2/3 min irregs, RI min irregs, RCA 100p (3.5x18 Xience DES), PDA/PL nl, EF 45-50% basal inf HK.  Marland Kitchen History of acute inferior wall MI    Tx with DES to RCA  . Hyperlipidemia   . Intolerance of drug    dyspnea with Brilinta >> changed to Effient   . Ischemic cardiomyopathy    a. EF 45-50% with inf HK at Inf STEMI // b. Echo in 10/15 with EF 55-60% with inf and inf-lat HK  . NSVT (nonsustained ventricular tachycardia) (HCC)    a. in the setting of inferior STEMI;  b. 02/2014 Echo: EF 55-60%, Gr 1 DD, basal-midinferolateral/inf HK, Gr 1 DD, triv TR.    Patient Active Problem List   Diagnosis Date Noted  . TIA (transient ischemic attack) 01/12/2017  . Chronic low back pain 03/11/2014  . Constipation 03/06/2014  . CAD (coronary artery disease), native coronary artery 03/04/2014  . Hyperlipidemia 03/04/2014  . Anxiety 03/04/2014  . History of acute inferior wall MI 02/28/2014    Past Surgical History:  Procedure Laterality Date  . CARDIAC CATHETERIZATION     02/28/2014 prox RCA single vessel occlusion treated with DES  . CORONARY ANGIOPLASTY WITH STENT PLACEMENT    . LEFT HEART CATH Bilateral 02/28/2014   Procedure: LEFT HEART CATH;  Surgeon: Iran Ouch, MD;  [facility] C[facility] LAB;  Service: Cardiovascular;  Laterality: Bilateral;       Home Medications    Prior to Admission medications   Medication Sig Start Date End Date Taking? Authorizing Provider  aspirin EC 81 MG tablet Take 81 mg by mouth daily.    [provider]  atorvastatin (LIPITOR) 20 MG tablet Take 1 tablet (20 mg total) by mouth daily. 02/14/16   Laurey Morale, MD  bisoprolol (ZEBETA) 5 MG tablet Take 1 tablet (5 mg total) by mouth daily. 02/14/16   Laurey Morale, MD  clopidogrel (PLAVIX) 75 MG tablet Take 1 tablet (75 mg total) by mouth daily. 02/14/16   Laurey Morale, MD  lisinopril (PRINIVIL,ZESTRIL) 5 MG tablet Take 1 tablet (5 mg total) by mouth daily. 02/14/16   Laurey Morale, MD  nitroGLYCERIN (NITROSTAT) 0.4 MG SL tablet Place 1 tablet (0.4 mg total) under the tongue every 5 (five) minutes as needed for chest pain. 01/11/15   Laurey Morale, MD    Family History Family History  Problem Relation Age of Onset  . Hypertension Father   . Heart attack Brother  [id number]    Social History Social History   Tobacco Use  . Smoking status: Former Games developermoker  . Smokeless tobacco: Never Used  Substance Use Topics  . Alcohol use: Yes    Alcohol/week: 0.0 oz  . Drug use: No     Allergies   Patient has no known allergies.   Review of Systems Review of Systems  Constitutional: Negative for fever.  HENT: Negative for sore throat.   Eyes: Negative for redness.  Respiratory: Negative for shortness of breath.   Cardiovascular: Positive for chest pain. Negative for leg swelling.  Gastrointestinal: Negative for abdominal pain.  Genitourinary: Negative for flank pain.  Musculoskeletal: Negative for back pain and neck pain.  Skin: Negative for rash.    Neurological: Negative for headaches.  Hematological: Does not bruise/bleed easily.  Psychiatric/Behavioral: Negative for confusion.     Physical Exam Updated Vital Signs BP (!) 150/80 (BP Location: Right Arm)   Pulse (!) 55   Temp 98.3 F (36.8 C) (Oral)   Resp (!) 25   SpO2 95%   Physical Exam  Constitutional: He is oriented to person, place, and time. He appears well-developed and well-nourished. No distress.  HENT:  Mouth/Throat: Oropharynx is clear and moist.  Eyes: Conjunctivae are normal.  Neck: Neck supple. No tracheal deviation present.  Cardiovascular: Normal rate, regular rhythm, normal heart sounds and intact distal pulses. Exam reveals no gallop and no friction rub.  No murmur heard. Pulmonary/Chest: Effort normal and breath sounds normal. No accessory muscle usage. No respiratory distress. He exhibits tenderness.  Abdominal: Soft. Bowel sounds are normal. He exhibits no distension. There is no tenderness.  Musculoskeletal: He exhibits no edema.  Neurological: He is alert and oriented to person, place, and time.  Skin: Skin is warm and dry. He is not diaphoretic.  Psychiatric:  Anxious appearing.   Nursing note and vitals reviewed.    ED Treatments / Results  Labs (all labs ordered are listed, but only abnormal results are displayed) Results for orders placed or performed during the hospital encounter of 06/01/17  Basic metabolic panel  Result Value Ref Range   Sodium 137 135 - 145 mmol/L   Potassium 4.0 3.5 - 5.1 mmol/L   Chloride 105 101 - 111 mmol/L   CO2 22 22 - 32 mmol/L   Glucose, Bld 117 (H) 65 - 99 mg/dL   BUN 19 6 - 20 mg/dL   Creatinine, Ser 0.980.95 0.61 - 1.24 mg/dL   Calcium 9.3 8.9 - 11.910.3 mg/dL   GFR calc non Af Amer >60 >60 mL/min   GFR calc Af Amer >60 >60 mL/min   Anion gap 10 5 - 15  CBC  Result Value Ref Range   WBC 6.2 4.0 - 10.5 K/uL   RBC 4.59 4.22 - 5.81 MIL/uL   Hemoglobin 15.1 13.0 - 17.0 g/dL   HCT 14.743.7 82.939.0 - 56.252.0 %   MCV  95.2 78.0 - 100.0 fL   MCH 32.9 26.0 - 34.0 pg   MCHC 34.6 30.0 - 36.0 g/dL   RDW 13.012.5 86.511.5 - 78.415.5 %   Platelets 215 150 - 400 K/uL  I-stat troponin, ED  Result Value Ref Range   Troponin i, poc 0.00 0.00 - 0.08 ng/mL   Comment 3           Dg Chest 2 View  Result Date: 06/01/2017 CLINICAL DATA:  Injury while playing ball EXAM: CHEST  2 VIEW COMPARISON:  Oct 01, 2015 FINDINGS: Lungs are clear. Heart  is upper normal in size with pulmonary vascularity within normal limits. No pneumothorax. No adenopathy. No evident bone lesions. IMPRESSION: No edema or consolidation.  No evident pneumothorax. Electronically Signed   By: Bretta Bang III M.D.   On: 06/01/2017 13:06    EKG  EKG Interpretation  Date/Time:  Friday June 01 2017 12:17:40 EST Ventricular Rate:  80 PR Interval:    QRS Duration: 104 QT Interval:  374 QTC Calculation: 432 R Axis:   -75 Text Interpretation:  Sinus rhythm Premature ventricular complexes Left anterior fascicular block No significant change since last tracing Confirmed by Cathren Laine ([id number]) on 06/01/2017 1:56:54 PM       Radiology Dg Chest 2 View  Result Date: 06/01/2017 CLINICAL DATA:  Injury while playing ball EXAM: CHEST  2 VIEW COMPARISON:  Oct 01, 2015 FINDINGS: Lungs are clear. Heart is upper normal in size with pulmonary vascularity within normal limits. No pneumothorax. No adenopathy. No evident bone lesions. IMPRESSION: No edema or consolidation.  No evident pneumothorax. Electronically Signed   By: Bretta Bang III M.D.   On: 06/01/2017 13:06    Procedures Procedures (including critical care time)  Medications Ordered in ED Medications - No data to display   Initial Impression / Assessment and Plan / ED Course  I have reviewed the triage vital signs and the nursing notes.  Pertinent labs & imaging results that were available during my care of the patient were reviewed by me and considered in my medical decision making (see chart  for details).  Iv ns. Ecg. Labs.  Reviewed nursing notes and prior charts for additional history.   Patient insists on cardiology being called, repetitively stating 'I just want to get this thing taken care of'.   Cardiology consulted.  Many aspects of pts pain is atypical - occurs at rest, seems worse w palpation. Pt in general appears v anxious. In the midst of talking about his chest pain, he also states he has not been able to work x several month due to dizziness and pain, although states no current dizziness.   Cardiology is coming to see. Initial troponin negative. Will order delta trop.  Disposition per cardiology service.         Final Clinical Impressions(s) / ED Diagnoses   Final diagnoses:  None    ED Discharge Orders    None           Cathren Laine, MD 06/01/17 1425

## 2017-06-01 NOTE — ED Triage Notes (Signed)
Pt reports L CP since last night, reports similar intermittent pain "for years", endorses SOB, denies n/v, diaphoresis. Pt guarded, appears to be in some distress.

## 2017-06-01 NOTE — H&P (Signed)
Cardiology Consultation:   Patient ID: Mitchell [name]; [id number]; Sep 27, 1960   Admit date: 06/01/2017 Date of Consult: 06/01/2017  Primary Care Provider: Patient, No Pcp Per Primary Cardiologist: New was Dr. Shirlee Latch  Primary Electrophysiologist:  NA   Patient Profile:   Mitchell [name] is a 57 y.o. male with a hx of CAD with inf. MI with DES to RCA 2015, HLD, dyspnea with brilinta, NSVT, EF 45-50%, and TIA 12/2016   who is being seen today for the evaluation of chest pain at the request of Dr. Denton Lank.  History of Present Illness:   Mitchell [name]  a hx of CAD with inf. MI with DES to RCA 2015, HLD, dyspnea with brilinta, NSVT, EF 45-50% and TIA 12/2016 now presents with chest pain mid sternal. dull and radiates to left chest worse with exertion improved with rest.   Pain has been coming and going for a year sometimes lasts all day.  This is Lt anterior chest pain.   At times nauseated with it and + SOB.  A few nights ago he had severe chest pain that was like his MI, burning chest pain and develops into heavy pressure and SOB, lasted 10-15 min.     EKG SR with PVCs no acute changes from 01/2017  Troponin poc 0.00 Na 137, K+ 4.0, Cr 0.95 Hgb 15.1, Plts 215 WBC 6.2   CXR 2 V : No edema or consolidation.  No evident pneumothorax.  Currently he is having the chest pain also back pain.  Is painful to palpation as well.  He has not been on meds for a year, including ASA, he has not been able to work since August due to the TIA and dizziness, so has not been able to afford meds.or MD visits.       Past Medical History:  Diagnosis Date  . CAD (coronary artery disease)    a. 02/2014 Inf STEMI/PCI: LM nl, LAD min irregs, Diags 1/2/3 min irregs, LCX min irregs, OM1/2/3 min irregs, RI min irregs, RCA 100p (3.5x18 Xience DES), PDA/PL nl, EF 45-50% basal inf HK.  Marland Kitchen History of acute inferior wall MI    Tx with DES to RCA  . Hyperlipidemia   . Intolerance of drug    dyspnea with Brilinta >>  changed to Effient   . Ischemic cardiomyopathy    a. EF 45-50% with inf HK at Inf STEMI // b. Echo in 10/15 with EF 55-60% with inf and inf-lat HK  . NSVT (nonsustained ventricular tachycardia) (HCC)    a. in the setting of inferior STEMI;  b. 02/2014 Echo: EF 55-60%, Gr 1 DD, basal-midinferolateral/inf HK, Gr 1 DD, triv TR.    Past Surgical History:  Procedure Laterality Date  . CARDIAC CATHETERIZATION     02/28/2014 prox RCA single vessel occlusion treated with DES  . CORONARY ANGIOPLASTY WITH STENT PLACEMENT    . LEFT HEART CATH Bilateral 02/28/2014   Procedure: LEFT HEART CATH;  Surgeon: Iran Ouch, MD;  Location: [facility] CATH LAB;  Service: Cardiovascular;  Laterality: Bilateral;     Home Medications:  Prior to Admission medications   Medication Sig Start Date End Date Taking? Authorizing Provider  aspirin EC 81 MG tablet Take 81 mg by mouth daily.    [provider]  atorvastatin (LIPITOR) 20 MG tablet Take 1 tablet (20 mg total) by mouth daily. 02/14/16   Laurey Morale, MD  bisoprolol (ZEBETA) 5 MG tablet Take 1 tablet (5 mg total) by mouth  daily. 02/14/16   Laurey MoraleMcLean, Dalton S, MD  clopidogrel (PLAVIX) 75 MG tablet Take 1 tablet (75 mg total) by mouth daily. 02/14/16   Laurey MoraleMcLean, Dalton S, MD  lisinopril (PRINIVIL,ZESTRIL) 5 MG tablet Take 1 tablet (5 mg total) by mouth daily. 02/14/16   Laurey MoraleMcLean, Dalton S, MD  nitroGLYCERIN (NITROSTAT) 0.4 MG SL tablet Place 1 tablet (0.4 mg total) under the tongue every 5 (five) minutes as needed for chest pain. 01/11/15   Laurey MoraleMcLean, Dalton S, MD  NOT TAKING ANY MEDS   Inpatient Medications: Scheduled Meds: . acetaminophen  1,000 mg Oral Once   Continuous Infusions:  PRN Meds:   Allergies:   No Known Allergies  Social History:   Social History   Socioeconomic History  . Marital status: Single    Spouse name: Not on file  . Number of children: Not on file  . Years of education: Not on file  . Highest education level: Not on file    Social Needs  . Financial resource strain: Not on file  . Food insecurity - worry: Not on file  . Food insecurity - inability: Not on file  . Transportation needs - medical: Not on file  . Transportation needs - non-medical: Not on file  Occupational History  . Not on file  Tobacco Use  . Smoking status: Former Games developermoker  . Smokeless tobacco: Never Used  Substance and Sexual Activity  . Alcohol use: Yes    Alcohol/week: 0.0 oz  . Drug use: No  . Sexual activity: Not on file  Other Topics Concern  . Not on file  Social History Narrative   ** Merged History Encounter **        Family History:    Family History  Problem Relation Age of Onset  . Hypertension Father   . Heart attack Brother 51     ROS:  Please see the history of present illness.  ROS  General:no colds or fevers, no weight changes Skin:no rashes or ulcers HEENT:no blurred vision, no congestion, +ear ringing  CV:see HPI PUL:see HPI GI:no diarrhea constipation or melena, occ bright blood he does have hemorrhoids  no indigestion GU:no hematuria, no dysuria MS:no joint pain, no claudication Neuro:no syncope, + lightheadedness, + Lt arm tingling now chronic  Endo:no diabetes, no thyroid disease     Physical Exam/Data:   Vitals:   06/01/17 1222 06/01/17 1400 06/01/17 1445 06/01/17 1500  BP: (!) 150/80 (!) 154/123 (!) 148/109 (!) 178/116  Pulse: (!) 55 76 87 89  Resp: (!) 25 13 (!) 23 (!) 22  Temp: 98.3 F (36.8 C)     TempSrc: Oral     SpO2: 95% 100% 95% 96%   No intake or output data in the 24 hours ending 06/01/17 1609 There were no vitals filed for this visit. There is no height or weight on file to calculate BMI.  General:  Well nourished, well developed, in no acute distress HEENT: normal Lymph: no adenopathy Neck: no JVD Endocrine:  No thryomegaly Vascular: No carotid bruits; FA pulses 2+ bilaterally without bruits  Cardiac:  normal S1, S2; RRR; no murmur, gallup, rub or click   Lungs:   clear to auscultation bilaterally, no wheezing, rhonchi or rales  Abd: soft, nontender, no hepatomegaly  Ext: no edema Musculoskeletal:  No deformities, BUE and BLE strength normal and equal Skin: warm and dry  Neuro:  CNs 2-12 intact, no focal abnormalities noted Psych:  Normal affect    Relevant CV Studies: Echo  02/2014 Study Conclusions  - Left ventricle: The cavity size was normal. Wall thickness was normal. Systolic function was normal. The estimated ejection fraction was in the range of 55% to 60%. There is hypokinesis of the basal-midinferolateral and inferior myocardium. Doppler parameters are consistent with abnormal left ventricular relaxation (grade 1 diastolic dysfunction). - Aortic valve: Mildly calcified annulus. Trileaflet. There was no significant regurgitation. - Left atrium: The atrium was at the upper limits of normal in size. - Right atrium: Central venous pressure (est): 3 mm Hg. 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 No defect or patent foramen ovale was identified. - Tricuspid valve: There was trivial regurgitation. - Pulmonary arteries: Systolic pressure could not be accurately estimated. - Pericardium, extracardiac: There was no pericardial effusion.  Impressions:  - Normal wall thickness with LVEF 55-60%, mid to basal inferolateral hypokinesis, grade 1 diastolic dysfunction. Upper normal left atrial size. 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 Unable to assess PASP.   02/28/2014  Coronary angiography: Coronary dominance: right        Left Main:  Normal.   Left Anterior Descending (LAD):   Normal in size with minor irregularities.  1st diagonal (D1):  Large in size with no significant disease.  2nd diagonal (D2):  Small in size with minor irregularities.  3rd diagonal (D3):  Small in size with minor  irregularities.  Circumflex (LCx):  Normal in size and nondominant. The vessel has minor irregularities.  1st obtuse marginal:  Medium in size with minor irregularities.  2nd obtuse marginal:  Normal in size with no significant disease.  3rd obtuse marginal:  Medium in size with no significant disease.      Ramus Intermedius:  Normal in size. The vessel has minor irregularities.  Right Coronary Artery: Large in size and dominant. The vessel is occluded proximally which is the culprit for inferior ST elevation myocardial infarction.  Posterior descending artery: Normal  Posterior AV segment: Normal  Posterolateral branchs:  Normal  Left ventriculography: Left ventricular systolic function is mildly reduced , LVEF is estimated at 45-50 %, there is no significant mitral regurgitation . Moderate nasal in fair wall hypokinesis  PCI Data: Vessel - right coronary artery/Segment - proximal Percent Stenosis (pre)  : 100% TIMI-flow : 0 Stent : 3.5 x 18 Xience drug-eluting stent Percent Stenosis (post) : 0% TIMI-flow (post) 3  Final Conclusions:  1. Severe one-vessel coronary artery disease with thrombotic occlusion of the proximal right coronary artery. Otherwise no obstructive disease. 2. Mildly reduced LV systolic function with an ejection fraction of 45-40% and mildly elevated left ventricular end-diastolic pressure. 3. Successful angioplasty and drug-eluting stent placement to the proximal right coronary artery.  Laboratory Data:  Chemistry Recent Labs  Lab 06/01/17 1241  NA 137  K 4.0  CL 105  CO2 22  GLUCOSE 117*  BUN 19  CREATININE 0.95  CALCIUM 9.3  GFRNONAA >60  GFRAA >60  ANIONGAP 10    No results for input(s): PROT, ALBUMIN, AST, ALT, ALKPHOS, BILITOT in the last 168 hours. Hematology Recent Labs  Lab 06/01/17 1241  WBC 6.2  RBC 4.59  HGB 15.1  HCT 43.7  MCV 95.2  MCH 32.9  MCHC 34.6  RDW 12.5  PLT 215   Cardiac EnzymesNo results for input(s):  TROPONINI in the last 168 hours.  Recent Labs  Lab 06/01/17 1301  TROPIPOC 0.00    BNPNo results for input(s): BNP, PROBNP in the  last 168 hours.  DDimer No results for input(s): DDIMER in the last 168 hours.  Radiology/Studies:  Dg Chest 2 View  Result Date: 06/01/2017 CLINICAL DATA:  Injury while playing ball EXAM: CHEST  2 VIEW COMPARISON:  Oct 01, 2015 FINDINGS: Lungs are clear. Heart is upper normal in size with pulmonary vascularity within normal limits. No pneumothorax. No adenopathy. No evident bone lesions. IMPRESSION: No edema or consolidation.  No evident pneumothorax. Electronically Signed   By: Bretta Bang III M.D.   On: 06/01/2017 13:06    Assessment and Plan:   1.    Chest pain with hx of CAD neg troponin poc, will admit and do serial troponins place back on meds he has not taken in a year or at least several months.  Plan lexiscan myoview tomorrow -add asa, and possible plavix -      Per old OV note  Dr. Shirlee Latch would favor long term DAPT due to brother with stent thrombosis     IV heparin if troponin + for now subcu for VTE prophylaxis    Will hold off on plavix for now but should be resumed at discharge.    2.    HTN elevated resume ace and coreg    3.    HLD check lipids and continue statin.       For questions or updates, please contact [facility] Please consult [email or web address] for contact info under Cardiology/STEMI.   Signed, Nada Boozer, NP  06/01/2017 4:09 PM   I have seen and examined the patient along with Nada Boozer, NP.  I have reviewed the chart, notes and new data.  I agree with PA/NP's note.  Key new complaints: episode of severe chest pressure is similar to prior MI and compatible with an episode of unstable angina Key examination changes: severely elevated BP, otherwise normal CV exam Key new findings / data: low risk ECG and enzymes, PVCs arte monomorphic and frequently seen on prior tracings  PLAN: Recheck enzymes, nuclear  perfusion study. Cath Monday if the perfusion study is abnormal. We need to identify a way for this patient to receive his inexpensive cardiac meds without interruption. (Can he be seen in [facility] if he is a [place] resident?).  Thurmon Fair, MD, [facility] [phone] 06/01/2017, 5:11 PM

## 2017-06-02 ENCOUNTER — Inpatient Hospital Stay (HOSPITAL_COMMUNITY): Payer: Self-pay

## 2017-06-02 DIAGNOSIS — R079 Chest pain, unspecified: Secondary | ICD-10-CM

## 2017-06-02 DIAGNOSIS — R072 Precordial pain: Secondary | ICD-10-CM

## 2017-06-02 LAB — CBC
HEMATOCRIT: 40 % (ref 39.0–52.0)
HEMOGLOBIN: 13.7 g/dL (ref 13.0–17.0)
MCH: 33 pg (ref 26.0–34.0)
MCHC: 34.3 g/dL (ref 30.0–36.0)
MCV: 96.4 fL (ref 78.0–100.0)
Platelets: 201 10*3/uL (ref 150–400)
RBC: 4.15 MIL/uL — ABNORMAL LOW (ref 4.22–5.81)
RDW: 12.6 % (ref 11.5–15.5)
WBC: 6.1 10*3/uL (ref 4.0–10.5)

## 2017-06-02 LAB — BASIC METABOLIC PANEL
Anion gap: 7 (ref 5–15)
BUN: 20 mg/dL (ref 6–20)
CALCIUM: 8.6 mg/dL — AB (ref 8.9–10.3)
CHLORIDE: 106 mmol/L (ref 101–111)
CO2: 24 mmol/L (ref 22–32)
CREATININE: 1.08 mg/dL (ref 0.61–1.24)
Glucose, Bld: 102 mg/dL — ABNORMAL HIGH (ref 65–99)
Potassium: 4.4 mmol/L (ref 3.5–5.1)
SODIUM: 137 mmol/L (ref 135–145)

## 2017-06-02 LAB — LIPID PANEL
CHOLESTEROL: 159 mg/dL (ref 0–200)
HDL: 41 mg/dL (ref >40)
LDL Cholesterol: 75 mg/dL (ref 0–99)
TRIGLYCERIDES: 217 mg/dL — AB (ref <150)
Total CHOL/HDL Ratio: 3.9 RATIO
VLDL: 43 mg/dL — AB (ref 0–40)

## 2017-06-02 LAB — NM MYOCAR MULTI W/SPECT W/WALL MOTION / EF
CSEPEDS: 55 s
Exercise duration (min): 6 min
Peak HR: 93 {beats}/min
Rest HR: 70 {beats}/min

## 2017-06-02 LAB — TROPONIN I

## 2017-06-02 LAB — CREATININE, SERUM
Creatinine, Ser: 0.92 mg/dL (ref 0.61–1.24)
GFR calc non Af Amer: >60 mL/min (ref >60)

## 2017-06-02 LAB — HIV ANTIBODY (ROUTINE TESTING W REFLEX): HIV Screen 4th Generation wRfx: NONREACTIVE

## 2017-06-02 MED ORDER — REGADENOSON 0.4 MG/5ML IV SOLN
0.4000 mg | Freq: Once | INTRAVENOUS | Status: AC
  Administered 2017-06-02: 0.4 mg via INTRAVENOUS
  Filled 2017-06-02: qty 5

## 2017-06-02 MED ORDER — TECHNETIUM TC 99M TETROFOSMIN IV KIT
30.0000 | PACK | Freq: Once | INTRAVENOUS | Status: AC | PRN
  Administered 2017-06-02: 30 via INTRAVENOUS

## 2017-06-02 MED ORDER — REGADENOSON 0.4 MG/5ML IV SOLN
INTRAVENOUS | Status: AC
  Filled 2017-06-02: qty 5

## 2017-06-02 MED ORDER — ATORVASTATIN CALCIUM 40 MG PO TABS: 40.0000 mg | ORAL_TABLET | Freq: Every day | ORAL | 3 refills | Status: DC

## 2017-06-02 MED ORDER — ASPIRIN 81 MG PO TBEC: 81.0000 mg | DELAYED_RELEASE_TABLET | Freq: Every day | ORAL | 3 refills | Status: DC

## 2017-06-02 MED ORDER — CARVEDILOL 6.25 MG PO TABS: 6.2500 mg | ORAL_TABLET | Freq: Two times a day (BID) | ORAL | 3 refills | Status: DC

## 2017-06-02 MED ORDER — TECHNETIUM TC 99M TETROFOSMIN IV KIT
10.0000 | PACK | Freq: Once | INTRAVENOUS | Status: AC | PRN
  Administered 2017-06-02: 10 via INTRAVENOUS

## 2017-06-02 NOTE — Progress Notes (Signed)
    Patient presented for Lexiscan nuclear stress test. Tolerated procedure well. Pending final stress imaging result.  Berton BonJanine Landon Truax, AGNP-C 06/02/2017  9:30 AM Pager: [phone]

## 2017-06-02 NOTE — ED Notes (Signed)
Pt earlier was questioning if he was being discharged, d/c ordered placed and patient informed. Pt states he has not been told about any results but he is leaving right now. Old patient I can call the doctor, pt says no that he is leaving and is very disappointed. Pt asked again if I could call the provider. Pt agreed and provider went over results. Pt states "there is something wrong with me I still feel sick and something is definitely wrong." All results are clear and pt is given d/c instructions with follow up. Pt stable and ambulatory for d/c

## 2017-06-02 NOTE — ED Notes (Signed)
Patient transported to nuclear medicine for stress test.

## 2017-06-02 NOTE — Progress Notes (Signed)
Progress Note  Patient Name: Mitchell [name] Date of Encounter: 06/02/2017  Primary Cardiologist: Marca Anconaalton McLean, MD   Subjective   Pt seen at stress test. Still having constant mild chest pressure. Says he has been having this for about a year.   Inpatient Medications    Scheduled Meds: . regadenoson      . acetaminophen  1,000 mg Oral Once  . aspirin EC  81 mg Oral Daily  . aspirin EC  81 mg Oral Daily  . atorvastatin  40 mg Oral q1800  . carvedilol  6.25 mg Oral BID WC  . famotidine  20 mg Oral BID  . heparin  5,000 Units Subcutaneous Q8H  . lisinopril  5 mg Oral Daily  . nitroGLYCERIN  1 inch Topical Q8H  . regadenoson  0.4 mg Intravenous Once   Continuous Infusions:  PRN Meds: acetaminophen, ALPRAZolam, nitroGLYCERIN, nitroGLYCERIN, ondansetron (ZOFRAN) IV, traMADol, zolpidem   Vital Signs    Vitals:   06/02/17 0400 06/02/17 0537 06/02/17 0600 06/02/17 0845  BP: 95/69 107/73 120/79 140/83  Pulse: 67 61 (!) 57   Resp: 15  19   Temp:      TempSrc:      SpO2: 96% 97% 98%    No intake or output data in the 24 hours ending 06/02/17 0905 There were no vitals filed for this visit.  Telemetry    Sinus rhythm with frequent PVCs - Personally Reviewed  ECG    Sinus rhythm with frequent Premature ventricular complexes, Left anterior fascicular block - Personally Reviewed  Physical Exam   GEN: No acute distress.   Neck: No JVD Cardiac: RRR, no murmurs, rubs, or gallops.  Respiratory: Clear to auscultation bilaterally. GI: Soft, nontender, non-distended  MS: No edema; No deformity. Neuro:  Nonfocal  Psych: Normal affect   Labs    Chemistry Recent Labs  Lab 06/01/17 1241 06/01/17 1710 06/01/17 2215 06/02/17 0535  NA 137  --   --  137  K 4.0  --   --  4.4  CL 105  --   --  106  CO2 22  --   --  24  GLUCOSE 117*  --   --  102*  BUN 19  --   --  20  CREATININE 0.95  --  0.92 1.08  CALCIUM 9.3  --   --  8.6*  PROT  --  7.4  --   --   ALBUMIN  --   4.2  --   --   AST  --  30  --   --   ALT  --  20  --   --   ALKPHOS  --  101  --   --   BILITOT  --  1.1  --   --   GFRNONAA >60  --  >60 >60  GFRAA >60  --  >60 >60  ANIONGAP 10  --   --  7     Hematology Recent Labs  Lab 06/01/17 1241 06/02/17 0535  WBC 6.2 6.1  RBC 4.59 4.15*  HGB 15.1 13.7  HCT 43.7 40.0  MCV 95.2 96.4  MCH 32.9 33.0  MCHC 34.6 34.3  RDW 12.5 12.6  PLT 215 201    Cardiac Enzymes Recent Labs  Lab 06/01/17 1710 06/01/17 2210 06/02/17 0535  TROPONINI <0.03 <0.03 <0.03    Recent Labs  Lab 06/01/17 1301 06/01/17 1610  TROPIPOC 0.00 0.01     BNPNo results for input(s):  BNP, PROBNP in the last 168 hours.   DDimer No results for input(s): DDIMER in the last 168 hours.   Radiology    Dg Chest 2 View  Result Date: 06/01/2017 CLINICAL DATA:  Injury while playing ball EXAM: CHEST  2 VIEW COMPARISON:  Oct 01, 2015 FINDINGS: Lungs are clear. Heart is upper normal in size with pulmonary vascularity within normal limits. No pneumothorax. No adenopathy. No evident bone lesions. IMPRESSION: No edema or consolidation.  No evident pneumothorax. Electronically Signed   By: Bretta Bang III M.D.   On: 06/01/2017 13:06    Cardiac Studies   Echo 02/2014 Study Conclusions  - Left ventricle: The cavity size was normal. Wall thickness was normal. Systolic function was normal. The estimated ejection fraction was in the range of 55% to 60%. There is hypokinesis of the basal-midinferolateral and inferior myocardium. Doppler parameters are consistent with abnormal left ventricular relaxation (grade 1 diastolic dysfunction). - Aortic valve: Mildly calcified annulus. Trileaflet. There was no significant regurgitation. - Left atrium: The atrium was at the upper limits of normal in size. - Right atrium: Central venous pressure (est): 3 mm Hg. 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  No defect or patent foramen ovale was identified. - Tricuspid valve: There was trivial regurgitation. - Pulmonary arteries: Systolic pressure could not be accurately estimated. - Pericardium, extracardiac: There was no pericardial effusion.  Impressions:  - Normal wall thickness with LVEF 55-60%, mid to basal inferolateral hypokinesis, grade 1 diastolic dysfunction. Upper normal left atrial size. 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 Unable to assess PASP.   02/28/2014  Coronary angiography: Coronary dominance:right   Left Main: Normal.   Left Anterior Descending (LAD): Normal in size with minor irregularities.  1st diagonal (D1): Large in size with no significant disease.  2nd diagonal (D2): Small in size with minor irregularities.  3rd diagonal (D3): Small in size with minor irregularities.  Circumflex (LCx): Normal in size and nondominant. The vessel has minor irregularities.  1st obtuse marginal: Medium in size with minor irregularities.  2nd obtuse marginal: Normal in size with no significant disease.  3rd obtuse marginal: Medium in size with no significant disease.  Ramus Intermedius: Normal in size. The vessel has minor irregularities.  Right Coronary Artery: Large in size and dominant. The vessel is occluded proximally which is the culprit for inferior ST elevation myocardial infarction.  Posterior descending artery: Normal  Posterior AV segment: Normal  Posterolateral branchs: Normal  Left ventriculography: Left ventricular systolic function is mildly reduced , LVEF is estimated at 45-50 %, there is no significant mitral regurgitation . Moderate nasal in fair wall hypokinesis  PCI Data: Vessel - right coronary artery/Segment - proximal Percent Stenosis (pre) : 100% TIMI-flow : 0 Stent : 3.5 x 18 Xience drug-elutingstent Percent Stenosis (post) : 0% TIMI-flow (post)  3  Final Conclusions:  1.Severe one-vessel coronary artery disease with thrombotic occlusion of the proximal right coronary artery. Otherwise no obstructive disease. 2. Mildly reduced LV systolic function with an ejection fraction of 45-40% and mildly elevated left ventricular end-diastolic pressure. 3. Successful angioplasty and drug-eluting stent placement to the proximal right coronary artery.   Patient Profile     57 y.o. male  with a hx of CAD with inf. MI with DES to RCA 2015, HLD, dyspnea with brilinta, NSVT, EF 45-50%, and TIA 12/2016   who is being seen today for the evaluation of chest pain  Assessment & Plan    1. Chest pain with hx of CAD  -Troponins negative X3 -placed back on meds he has not taken in a year or at least several months.   -lexiscan myoview being done now.  -add asa, and possible plavix  -Per old OV note  Dr. Shirlee Latch would favor long term DAPT due to brother with stent thrombosis  -Will hold off on plavix for now but should be resumed at discharge.    2.    HTN elevated resume ace and coreg    3.    HLD check lipids and continue statin.    For questions or updates, please contact [facility] Please consult [email or web address] for contact info under Cardiology/STEMI.      Signed, Berton Bon, NP  06/02/2017, 9:05 AM    As above; continues with atypical CP; enzymes negative; await nuclear results; if no ischemia, DC today on present meds and resume plavix 75 mg daily; FU with Dr Royann Shivers at DC. Olga Millers

## 2017-06-02 NOTE — Discharge Summary (Signed)
Discharge Summary    Patient ID: Mitchell [name],  MRN: [id number], DOB/AGE: March 31, 1961 57 y.o.  Admit date: 06/01/2017 Discharge date: 06/02/2017  Primary Care Provider: Patient, No Pcp Per Primary Cardiologist: Marca Ancona, MD  Discharge Diagnoses    Active Problems:   Chest pain   Allergies No Known Allergies  Diagnostic Studies/Procedures    Lexiscan Myoview 06/02/2017  There was no ST segment deviation noted during stress.  Defect 1: There is a large defect of severe severity present in the basal inferolateral, mid inferolateral and apex location.  This is a low risk study.  Findings consistent with prior myocardial infarction with peri-infarct ischemia.  The left ventricular ejection fraction is mildly decreased (45-54%).   Abnormal, low risk stress nuclear study with prior inferolateral infarct and trivial peri-infarct ischemia towards the apex; EF 48 with hypokinesis of the inferolateral wall; mild LVE. _____________   History of Present Illness     Mr. Mitchell [name]  a hx of CAD with inf. MI with DES to RCA 2015, HLD, dyspnea with brilinta, NSVT, EF 45-50% and TIA 12/2016 now presents with chest pain mid sternal. dull and radiates to left chest worse with exertion improved with rest.   Pain has been coming and going for a year sometimes lasts all day.  This is Lt anterior chest pain.   At times nauseated with it and + SOB.  A few nights ago he had severe chest pain that was like his MI, burning chest pain and develops into heavy pressure and SOB, lasted 10-15 min.     EKG SR with PVCs no acute changes from 01/2017  Troponin poc 0.00 Na 137, K+ 4.0, Cr 0.95 Hgb 15.1, Plts 215 WBC 6.2   CXR 2 V : No edema or consolidation. No evident pneumothorax.  Pt having the chest pain also back pain.  Is painful to palpation as well.  He has not been on meds for a year, including ASA, he has not been able to work since August due to the TIA and dizziness, so has not  been able to afford meds.or MD visits.      Hospital Course     Consultants: none  Pt continued to have atypical chest pain. Troponins negative X3. placed back on meds he has not taken in a year or at least several months. Pt had low risk myoview. Will discharge. Resume Plavix. Follow up with Dr. Royann Shivers.   Patient has been seen by Dr. Jens Som today and deemed ready for discharge home. All follow up appointments have been scheduled. Discharge medications are listed below.  _____________  Discharge Vitals Blood pressure (!) 84/44, pulse 65, temperature 98.3 F (36.8 C), temperature source Oral, resp. rate 13, SpO2 96 %.  There were no vitals filed for this visit.  Labs & Radiologic Studies    CBC Recent Labs    06/01/17 1241 06/02/17 0535  WBC 6.2 6.1  HGB 15.1 13.7  HCT 43.7 40.0  MCV 95.2 96.4  PLT 215 201   Basic Metabolic Panel Recent Labs    53/66/44 1241 06/01/17 1710 06/01/17 2215 06/02/17 0535  NA 137  --   --  137  K 4.0  --   --  4.4  CL 105  --   --  106  CO2 22  --   --  24  GLUCOSE 117*  --   --  102*  BUN 19  --   --  20  CREATININE 0.95  --  0.92 1.08  CALCIUM 9.3  --   --  8.6*  MG  --  2.2  --   --    Liver Function Tests Recent Labs    06/01/17 1710  AST 30  ALT 20  ALKPHOS 101  BILITOT 1.1  PROT 7.4  ALBUMIN 4.2   No results for input(s): LIPASE, AMYLASE in the last 72 hours. Cardiac Enzymes Recent Labs    06/01/17 1710 06/01/17 2210 06/02/17 0535  TROPONINI <0.03 <0.03 <0.03   BNP Invalid input(s): POCBNP D-Dimer No results for input(s): DDIMER in the last 72 hours. Hemoglobin A1C Recent Labs    06/01/17 1710  HGBA1C 5.0   Fasting Lipid Panel Recent Labs    06/02/17 0535  CHOL 159  HDL 41  LDLCALC 75  TRIG 217*  CHOLHDL 3.9   Thyroid Function Tests Recent Labs    06/01/17 1710  TSH 1.429   _____________  Dg Chest 2 View  Result Date: 06/01/2017 CLINICAL DATA:  Injury while playing ball EXAM: CHEST  2  VIEW COMPARISON:  Oct 01, 2015 FINDINGS: Lungs are clear. Heart is upper normal in size with pulmonary vascularity within normal limits. No pneumothorax. No adenopathy. No evident bone lesions. IMPRESSION: No edema or consolidation.  No evident pneumothorax. Electronically Signed   By: Bretta BangWilliam  Woodruff III M.D.   On: 06/01/2017 13:06   Nm Myocar Multi W/spect W/wall Motion / Ef  Result Date: 06/02/2017  There was no ST segment deviation noted during stress.  Defect 1: There is a large defect of severe severity present in the basal inferolateral, mid inferolateral and apex location.  This is a low risk study.  Findings consistent with prior myocardial infarction with peri-infarct ischemia.  The left ventricular ejection fraction is mildly decreased (45-54%).  Abnormal, low risk stress nuclear study with prior inferolateral infarct and trivial peri-infarct ischemia towards the apex; EF 48 with hypokinesis of the inferolateral wall; mild LVE.   Disposition   Pt is being discharged home today in good condition.  Follow-up Plans & Appointments    Follow-up Information    Croitoru, Mihai, MD Follow up.   Specialty:  Cardiology Why:  The office will call  you to schedule an office cardiology follow up. Contact information: [address] [place] [address] [phone]          Discharge Instructions    Diet - low sodium heart healthy   Complete by:  As directed    Increase activity slowly   Complete by:  As directed       Discharge Medications   Allergies as of 06/02/2017   No Known Allergies     Medication List    STOP taking these medications   bisoprolol 5 MG tablet Commonly known as:  ZEBETA     TAKE these medications   aspirin 81 MG EC tablet Take 1 tablet (81 mg total) by mouth daily. Start taking on:  06/03/2017   atorvastatin 40 MG tablet Commonly known as:  LIPITOR Take 1 tablet (40 mg total) by mouth daily at 6 PM. What changed:     medication strength  how much to take  when to take this   carvedilol 6.25 MG tablet Commonly known as:  COREG Take 1 tablet (6.25 mg total) by mouth 2 (two) times daily with a meal.   clopidogrel 75 MG tablet Commonly known as:  PLAVIX Take 1 tablet (75 mg total) by mouth daily.   lisinopril 5 MG  tablet Commonly known as:  PRINIVIL,ZESTRIL Take 1 tablet (5 mg total) by mouth daily.   nitroGLYCERIN 0.4 MG SL tablet Commonly known as:  NITROSTAT Place 1 tablet (0.4 mg total) under the tongue every 5 (five) minutes as needed for chest pain.         Outstanding Labs/Studies     Duration of Discharge Encounter   Greater than 30 minutes including physician time.  Signed, Berton Bon NP 06/02/2017, 3:04 PM

## 2017-06-02 NOTE — ED Notes (Signed)
Paged Admitting regarding pt needing to be upgraded to SDU for active chest pain

## 2017-06-04 ENCOUNTER — Telehealth: Payer: Self-pay | Admitting: *Deleted

## 2017-06-04 NOTE — Telephone Encounter (Signed)
Pt was unsure of cardiologist to follow up with.  EDCM reviewed AVS and gave pt name and number of Dr Amanda Cockayneroitou as listed.  Pt appreciative and asked that his "my chart be updated."

## 2017-07-09 ENCOUNTER — Ambulatory Visit: Payer: Self-pay | Admitting: Cardiovascular Disease

## 2017-08-28 ENCOUNTER — Ambulatory Visit: Payer: Self-pay | Admitting: Cardiovascular Disease

## 2017-11-20 ENCOUNTER — Emergency Department (HOSPITAL_COMMUNITY)
Admission: EM | Admit: 2017-11-20 | Discharge: 2017-11-20 | Discharge status: Against Medical Advice | Payer: Self-pay | Attending: Emergency Medicine | Admitting: Emergency Medicine

## 2017-11-20 ENCOUNTER — Other Ambulatory Visit: Payer: Self-pay

## 2017-11-20 ENCOUNTER — Encounter (HOSPITAL_COMMUNITY): Payer: Self-pay

## 2017-11-20 ENCOUNTER — Emergency Department (HOSPITAL_COMMUNITY): Payer: Self-pay

## 2017-11-20 DIAGNOSIS — Z5321 Procedure and treatment not carried out due to patient leaving prior to being seen by health care provider: Secondary | ICD-10-CM | POA: Insufficient documentation

## 2017-11-20 DIAGNOSIS — R079 Chest pain, unspecified: Secondary | ICD-10-CM | POA: Insufficient documentation

## 2017-11-20 LAB — BASIC METABOLIC PANEL
Anion gap: 9 (ref 5–15)
BUN: 14 mg/dL (ref 6–20)
CO2: 22 mmol/L (ref 22–32)
CREATININE: 1.13 mg/dL (ref 0.61–1.24)
Calcium: 9.3 mg/dL (ref 8.9–10.3)
Chloride: 109 mmol/L (ref 98–111)
GFR calc Af Amer: >60 mL/min (ref >60)
Glucose, Bld: 93 mg/dL (ref 70–99)
Potassium: 4 mmol/L (ref 3.5–5.1)
SODIUM: 140 mmol/L (ref 135–145)

## 2017-11-20 LAB — CBC
HEMATOCRIT: 43.7 % (ref 39.0–52.0)
Hemoglobin: 14.9 g/dL (ref 13.0–17.0)
MCH: 32.9 pg (ref 26.0–34.0)
MCHC: 34.1 g/dL (ref 30.0–36.0)
MCV: 96.5 fL (ref 78.0–100.0)
Platelets: 234 10*3/uL (ref 150–400)
RBC: 4.53 MIL/uL (ref 4.22–5.81)
RDW: 12.4 % (ref 11.5–15.5)
WBC: 7.3 10*3/uL (ref 4.0–10.5)

## 2017-11-20 LAB — I-STAT TROPONIN, ED: Troponin i, poc: 0.02 ng/mL (ref 0.00–0.08)

## 2017-11-20 NOTE — ED Triage Notes (Signed)
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 C/O Chronic dizziness, SOB. Central CP "feels like heart burn"
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
## 2017-11-20 NOTE — ED Notes (Signed)
Pt mother (who is demented) keeps walking up to nurses station. Walked pt mother back to the pt and pt states "get her away from me, I cant be around her" Informed pt that he is her caregiver and she must remain with him because she is wandering around the department. Pt proceeds to get out of the bed in triage and take his BP cuff off stating "I have to go I cant be near her, can you not watch her while I am here, I am sick, I cant take care of her anymore, can you not call social services for me" Informed pt that I am not her caregiver... That is not my responsibility that is his. Pt proceeds to walk out of lobby and leaves.
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
## 2018-04-24 ENCOUNTER — Encounter (HOSPITAL_COMMUNITY): Payer: Self-pay | Admitting: Emergency Medicine

## 2018-04-24 ENCOUNTER — Ambulatory Visit (HOSPITAL_COMMUNITY)
Admission: EM | Admit: 2018-04-24 | Discharge: 2018-04-24 | Disposition: A | Discharge status: Home / Self Care | Payer: Self-pay | Attending: Family Medicine | Admitting: Family Medicine

## 2018-04-24 DIAGNOSIS — K047 Periapical abscess without sinus: Secondary | ICD-10-CM

## 2018-04-24 MED ORDER — AMOXICILLIN-POT CLAVULANATE 875-125 MG PO TABS: 1.0000 | ORAL_TABLET | Freq: Two times a day (BID) | ORAL | 0 refills | Status: AC

## 2018-04-24 NOTE — ED Triage Notes (Signed)
Pt c/o tooth pain x1 year states its gotten worse and needs them to be pulled but I cant afford it. States difficult to eat.

## 2018-04-24 NOTE — ED Provider Notes (Signed)
[facility]    CSN: [id number] Arrival date & time: 04/24/18  1320     History   Chief Complaint Chief Complaint  Patient presents with  . Dental Pain    HPI Mitchell [name] is a 57 y.o. male history of CAD, hyperlipidemia, ischemic cardiomyopathy presenting today for evaluation of dental pain.  Patient states that he has had dental pain over the past year and is needed to get a full teeth removed.  He has been of let able to as he has been disabled and has not had the money to.  Over the past week he has had worsening pain and swelling specifically to his posterior molars on his right upper and lower jaw as well as left lower jaw.  He is had difficulty eating due to pain and is started to cause headaches and ear pain as well.  He notes a popping sensation in his ear as well as some mild congestion.  He denies fevers.  Denies difficulty moving neck.  He has been taking ibuprofen 600 mg every few hours.  HPI  Past Medical History:  Diagnosis Date  . CAD (coronary artery disease)    a. 02/2014 Inf STEMI/PCI: LM nl, LAD min irregs, Diags 1/2/3 min irregs, LCX min irregs, OM1/2/3 min irregs, RI min irregs, RCA 100p (3.5x18 Xience DES), PDA/PL nl, EF 45-50% basal inf HK.  Marland Kitchen. History of acute inferior wall MI    Tx with DES to RCA  . Hyperlipidemia   . Intolerance of drug    dyspnea with Brilinta >> changed to Effient   . Ischemic cardiomyopathy    a. EF 45-50% with inf HK at Inf STEMI // b. Echo in 10/15 with EF 55-60% with inf and inf-lat HK  . NSVT (nonsustained ventricular tachycardia) (HCC)    a. in the setting of inferior STEMI;  b. 02/2014 Echo: EF 55-60%, Gr 1 DD, basal-midinferolateral/inf HK, Gr 1 DD, triv TR.    Patient Active Problem List   Diagnosis Date Noted  . Chest pain 06/01/2017  . TIA (transient ischemic attack) 01/12/2017  . Chronic low back pain 03/11/2014  . Constipation 03/06/2014  . CAD (coronary artery disease), native coronary artery  03/04/2014  . Hyperlipidemia 03/04/2014  . Anxiety 03/04/2014  . History of acute inferior wall MI 02/28/2014    Past Surgical History:  Procedure Laterality Date  . CARDIAC CATHETERIZATION     02/28/2014 prox RCA single vessel occlusion treated with DES  . CORONARY ANGIOPLASTY WITH STENT PLACEMENT    . LEFT HEART CATH Bilateral 02/28/2014   Procedure: LEFT HEART CATH;  Surgeon: Iran OuchMuhammad A Arida, MD;  [facility];  Service: Cardiovascular;  Laterality: Bilateral;       Home Medications    Prior to Admission medications   Medication Sig Start Date End Date Taking? Authorizing Provider  amoxicillin-clavulanate (AUGMENTIN) 875-125 MG tablet Take 1 tablet by mouth every 12 (twelve) hours for 7 days. 04/24/18 05/01/18  , Junius CreamerHallie C, PA-C    Family History Family History  Problem Relation Age of Onset  . Hypertension Father   . Heart attack Brother 4851    Social History Social History   Tobacco Use  . Smoking status: Former Games developermoker  . Smokeless tobacco: Never Used  Substance Use Topics  . Alcohol use: Yes    Alcohol/week: 0.0 standard drinks  . Drug use: No     Allergies   Patient has no known allergies.   Review of  Systems Review of Systems  Constitutional: Negative for activity change, appetite change, fatigue and fever.  HENT: Positive for congestion, dental problem, ear pain and sore throat. Negative for sinus pressure.   Eyes: Negative for photophobia, pain and visual disturbance.  Respiratory: Negative for cough and shortness of breath.   Cardiovascular: Negative for chest pain.  Gastrointestinal: Negative for abdominal pain, nausea and vomiting.  Genitourinary: Negative for decreased urine volume and hematuria.  Musculoskeletal: Negative for myalgias, neck pain and neck stiffness.  Neurological: Positive for headaches. Negative for dizziness, syncope, facial asymmetry, speech difficulty, weakness, light-headedness and numbness.     Physical  Exam Triage Vital Signs ED Triage Vitals  Enc Vitals Group     BP 04/24/18 1442 (!) 172/90     Pulse Rate 04/24/18 1441 69     Resp 04/24/18 1441 16     Temp 04/24/18 1441 98.2 F (36.8 C)     Temp src --      SpO2 04/24/18 1441 99 %     Weight --      Height --      Head Circumference --      Peak Flow --      Pain Score 04/24/18 1442 10     Pain Loc --      Pain Edu? --      Excl. in GC? --    No data found.  Updated Vital Signs BP (!) 172/90   Pulse 69   Temp 98.2 F (36.8 C)   Resp 16   SpO2 99%   Visual Acuity Right Eye Distance:   Left Eye Distance:   Bilateral Distance:    Right Eye Near:   Left Eye Near:    Bilateral Near:     Physical Exam  Constitutional: He appears well-developed and well-nourished.  HENT:  Head: Normocephalic and atraumatic.  Bilateral ears without tenderness to palpation of external auricle, tragus and mastoid, EAC's without erythema or swelling, TM's with good bony landmarks and cone of light. Non erythematous.  Right TM with effusion present.  Oral mucosa pink and moist, no tonsillar enlargement or exudate. Posterior pharynx patent and nonerythematous, no uvula deviation or swelling. Normal phonation.  Overall poor dentition, multiple teeth missing, visible decay on some.  Surrounding gingiva with significant tenderness and erythema surrounding posterior molars.  Soft palate without swelling, posterior pharynx patent without significant erythema  Eyes: Conjunctivae are normal.  Neck: Neck supple.  Cardiovascular: Normal rate and regular rhythm.  No murmur heard. Pulmonary/Chest: Effort normal and breath sounds normal. No respiratory distress.  Breathing comfortably at rest, CTABL, no wheezing, rales or other adventitious sounds auscultated  Abdominal: Soft. There is no tenderness.  Musculoskeletal: He exhibits no edema.  Neurological: He is alert.  Skin: Skin is warm and dry.  Psychiatric: He has a normal mood and affect.    Nursing note and vitals reviewed.    UC Treatments / Results  Labs (all labs ordered are listed, but only abnormal results are displayed) Labs Reviewed - No data to display  EKG None  Radiology No results found.  Procedures Procedures (including critical care time)  Medications Ordered in UC Medications - No data to display  Initial Impression / Assessment and Plan / UC Course  I have reviewed the triage vital signs and the nursing notes.  Pertinent labs & imaging results that were available during my care of the patient were reviewed by me and considered in my medical decision making (see chart  for details).     We will treat patient for dental infection with Augmentin.  Will have continue ibuprofen, but recommended only take every 8 hours, add in Tylenol.  Follow-up with dentistry, dental resource provided.Discussed strict return precautions. Patient verbalized understanding and is agreeable with plan.  Final Clinical Impressions(s) / UC Diagnoses   Final diagnoses:  Dental infection     Discharge Instructions     Please use dental resource to contact offices to seek permenant treatment/relief.   Today we have given you an antibiotic. This should help with pain as any infection is cleared.   For pain please take 600mg -800mg  of Ibuprofen every 8 hours, take with 1000 mg of Tylenol Extra strength every 8 hours. These are safe to take together. Please take with food.    Please return if you start to experience significant swelling of your face, experiencing fever.   ED Prescriptions    Medication Sig Dispense Auth. Provider   amoxicillin-clavulanate (AUGMENTIN) 875-125 MG tablet Take 1 tablet by mouth every 12 (twelve) hours for 7 days. 14 tablet , [place] C, PA-C     Controlled Substance Prescriptions [place] Controlled Substance Registry consulted? Not Applicable   Lew Dawes, [place] 04/24/18 1702

## 2018-04-24 NOTE — Discharge Instructions (Signed)
Please use dental resource to contact offices to seek permenant treatment/relief.  ° °Today we have given you an antibiotic. This should help with pain as any infection is cleared.  ° °For pain please take 600mg-800mg of Ibuprofen every 8 hours, take with 1000 mg of Tylenol Extra strength every 8 hours. These are safe to take together. Please take with food.  ° °Please return if you start to experience significant swelling of your face, experiencing fever. °

## 2018-06-22 DIAGNOSIS — Z8616 Personal history of COVID-19: Secondary | ICD-10-CM | POA: Insufficient documentation

## 2018-06-22 HISTORY — DX: Personal history of COVID-19: Z86.16

## 2018-08-19 ENCOUNTER — Ambulatory Visit
Admission: EM | Admit: 2018-08-19 | Discharge: 2018-08-19 | Disposition: A | Discharge status: Home / Self Care | Payer: Medicare Other

## 2018-08-19 NOTE — ED Notes (Signed)
This RN went out to call patient back.  Patient not wearing his mask.  This RN asked patient to place mask on prior to being brought to room.  Patient refused, threw stickers on chair and walked out.

## 2018-09-03 ENCOUNTER — Ambulatory Visit
Admission: EM | Admit: 2018-09-03 | Discharge: 2018-09-03 | Disposition: A | Discharge status: Home / Self Care | Payer: Medicare Other | Attending: Physician Assistant | Admitting: Physician Assistant

## 2018-09-03 DIAGNOSIS — G8929 Other chronic pain: Secondary | ICD-10-CM | POA: Diagnosis not present

## 2018-09-03 DIAGNOSIS — I10 Essential (primary) hypertension: Secondary | ICD-10-CM | POA: Diagnosis not present

## 2018-09-03 DIAGNOSIS — K089 Disorder of teeth and supporting structures, unspecified: Secondary | ICD-10-CM

## 2018-09-03 HISTORY — DX: Essential (primary) hypertension: I10

## 2018-09-03 MED ORDER — AMOXICILLIN-POT CLAVULANATE 875-125 MG PO TABS: 1.0000 | ORAL_TABLET | Freq: Two times a day (BID) | ORAL | 0 refills | Status: DC

## 2018-09-03 NOTE — ED Triage Notes (Signed)
Pt c/o upper and lower toothache for over a year off and on. States unable to get to the dentist. States amoxicillin is the only thing that helps and he is out of those.

## 2018-09-03 NOTE — ED Provider Notes (Signed)
[facility]    CSN: [id number] Arrival date & time: 09/03/18  1033     History   Chief Complaint Chief Complaint  Patient presents with  . Dental Pain    HPI Mitchell [name] is a 58 y.o. male.   58 year old male comes in for worsening of chronic upper and lower dental pain. States he knows those teeth need to be removed, but has called dentists in [place] and no one takes medicare and he cannot afford the procedures. He denies any swelling of the throat, trouble breathing, trouble swallowing, tripoding, drooling, trismus. Denies fever, chills, night sweats. States in the past, will take augmentin with improvement. He tries to gurgle with salt water without much improvement.      Past Medical History:  Diagnosis Date  . CAD (coronary artery disease)    a. 02/2014 Inf STEMI/PCI: LM nl, LAD min irregs, Diags 1/2/3 min irregs, LCX min irregs, OM1/2/3 min irregs, RI min irregs, RCA 100p (3.5x18 Xience DES), PDA/PL nl, EF 45-50% basal inf HK.  Marland Kitchen. History of acute inferior wall MI    Tx with DES to RCA  . Hyperlipidemia   . Hypertension   . Intolerance of drug    dyspnea with Brilinta >> changed to Effient   . Ischemic cardiomyopathy    a. EF 45-50% with inf HK at Inf STEMI // b. Echo in 10/15 with EF 55-60% with inf and inf-lat HK  . NSVT (nonsustained ventricular tachycardia) (HCC)    a. in the setting of inferior STEMI;  b. 02/2014 Echo: EF 55-60%, Gr 1 DD, basal-midinferolateral/inf HK, Gr 1 DD, triv TR.    Patient Active Problem List   Diagnosis Date Noted  . Chest pain 06/01/2017  . TIA (transient ischemic attack) 01/12/2017  . Chronic low back pain 03/11/2014  . Constipation 03/06/2014  . CAD (coronary artery disease), native coronary artery 03/04/2014  . Hyperlipidemia 03/04/2014  . Anxiety 03/04/2014  . History of acute inferior wall MI 02/28/2014    Past Surgical History:  Procedure Laterality Date  . CARDIAC CATHETERIZATION     02/28/2014 prox  RCA single vessel occlusion treated with DES  . CORONARY ANGIOPLASTY WITH STENT PLACEMENT    . LEFT HEART CATH Bilateral 02/28/2014   Procedure: LEFT HEART CATH;  Surgeon: Iran OuchMuhammad A Arida, MD;  [facility];  Service: Cardiovascular;  Laterality: Bilateral;       Home Medications    Prior to Admission medications   Medication Sig Start Date End Date Taking? Authorizing Provider  amoxicillin-clavulanate (AUGMENTIN) 875-125 MG tablet Take 1 tablet by mouth every 12 (twelve) hours. 09/03/18   Belinda FisherYu, [place] Constantine V, PA-C    Family History Family History  Problem Relation Age of Onset  . Hypertension Father   . Heart attack Brother 1051    Social History Social History   Tobacco Use  . Smoking status: Former Games developermoker  . Smokeless tobacco: Never Used  Substance Use Topics  . Alcohol use: Yes    Alcohol/week: 0.0 standard drinks  . Drug use: No     Allergies   Patient has no known allergies.   Review of Systems Review of Systems  Reason unable to perform ROS: See HPI as above.     Physical Exam Triage Vital Signs ED Triage Vitals  Enc Vitals Group     BP 09/03/18 1042 (!) 188/119     Pulse Rate 09/03/18 1042 65     Resp 09/03/18 1042 20  Temp 09/03/18 1042 98.2 F (36.8 C)     Temp Source 09/03/18 1042 Oral     SpO2 09/03/18 1042 95 %     Weight --      Height --      Head Circumference --      Peak Flow --      Pain Score 09/03/18 1043 5     Pain Loc --      Pain Edu? --      Excl. in GC? --    No data found.  Updated Vital Signs BP (!) 188/119 (BP Location: Left Arm)   Pulse 65   Temp 98.2 F (36.8 C) (Oral)   Resp 20   SpO2 95%   Physical Exam Constitutional:      General: He is not in acute distress.    Appearance: He is well-developed. He is not ill-appearing, toxic-appearing or diaphoretic.  HENT:     Head: Normocephalic and atraumatic.     Jaw: No trismus.     Mouth/Throat:     Mouth: Mucous membranes are moist.     Pharynx: Oropharynx  is clear. Uvula midline. No uvula swelling.     Tonsils: No tonsillar exudate.      Comments: Tenderness to palpation of above circled teeth, with movement to the teeth. No gum swelling.  Floor of mouth soft to palpation. No facial swelling.  Eyes:     Conjunctiva/sclera: Conjunctivae normal.     Pupils: Pupils are equal, round, and reactive to light.  Neck:     Musculoskeletal: Normal range of motion and neck supple.  Pulmonary:     Effort: Pulmonary effort is normal. No accessory muscle usage, prolonged expiration or respiratory distress.  Skin:    General: Skin is warm and dry.  Neurological:     Mental Status: He is alert and oriented to person, place, and time.      UC Treatments / Results  Labs (all labs ordered are listed, but only abnormal results are displayed) Labs Reviewed - No data to display  EKG None  Radiology No results found.  Procedures Procedures (including critical care time)  Medications Ordered in UC Medications - No data to display  Initial Impression / Assessment and Plan / UC Course  I have reviewed the triage vital signs and the nursing notes.  Pertinent labs & imaging results that were available during my care of the patient were reviewed by me and considered in my medical decision making (see chart for details).    Start antibiotics for possible dental infection. Symptomatic treatment as needed. Discussed with patient symptoms can return if dental problem is not addressed. Follow up with dentist for further evaluation and treatment of dental pain. Resources given. Return precautions given.   Final Clinical Impressions(s) / UC Diagnoses   Final diagnoses:  Chronic dental pain   ED Prescriptions    Medication Sig Dispense Auth. Provider   amoxicillin-clavulanate (AUGMENTIN) 875-125 MG tablet Take 1 tablet by mouth every 12 (twelve) hours. 14 tablet Threasa Alpha, [place] 09/03/18 1059

## 2018-09-03 NOTE — Discharge Instructions (Signed)
Start Augmentin as directed for dental infection. Follow up with dentist for further treatment and evaluation. If experiencing swelling of the throat, trouble breathing, trouble swallowing, leaning forward to breath, drooling, go to the emergency department for further evaluation.  ° °

## 2018-09-24 ENCOUNTER — Other Ambulatory Visit: Payer: Self-pay

## 2018-09-24 ENCOUNTER — Ambulatory Visit
Admission: EM | Admit: 2018-09-24 | Discharge: 2018-09-24 | Disposition: A | Discharge status: Home / Self Care | Payer: Medicare Other | Attending: Physician Assistant | Admitting: Physician Assistant

## 2018-09-24 DIAGNOSIS — K089 Disorder of teeth and supporting structures, unspecified: Secondary | ICD-10-CM | POA: Diagnosis not present

## 2018-09-24 DIAGNOSIS — G8929 Other chronic pain: Secondary | ICD-10-CM

## 2018-09-24 MED ORDER — PENICILLIN V POTASSIUM 500 MG PO TABS: 500.0000 mg | ORAL_TABLET | Freq: Four times a day (QID) | ORAL | 0 refills | Status: AC

## 2018-09-24 NOTE — Discharge Instructions (Signed)
Start penicillin as directed for dental infection. Follow up with dentist for further treatment and evaluation. If experiencing swelling of the throat, trouble breathing, trouble swallowing, leaning forward to breath, drooling, go to the emergency department for further evaluation.

## 2018-09-24 NOTE — ED Provider Notes (Signed)
[facility]    CSN: [id number] Arrival date & time: 09/24/18  1325     History   Chief Complaint Chief Complaint  Patient presents with  . Dental Pain    HPI Mitchell [name] is a 58 y.o. male.   58 year old male returns for exacerbation of chronic upper and lower dental pain.  He was seen a month ago for similar symptoms.  At that time, he took Augmentin with resolution of symptoms prior to current symptom onset.  States for the past few days, has had increasing pain, with radiation to the ears.  Has multiple teeth that requires removal, but he has not been able to follow-up with dentists.  States he feels that the tooth has become looser than normal.  Denies injury/trauma.  Denies swelling of the throat, trouble breathing, trouble swallowing, tripoding, drooling, trismus.  Denies fever, chills, night sweats.  He continues to gargle with salt water.     Past Medical History:  Diagnosis Date  . CAD (coronary artery disease)    a. 02/2014 Inf STEMI/PCI: LM nl, LAD min irregs, Diags 1/2/3 min irregs, LCX min irregs, OM1/2/3 min irregs, RI min irregs, RCA 100p (3.5x18 Xience DES), PDA/PL nl, EF 45-50% basal inf HK.  Marland Kitchen History of acute inferior wall MI    Tx with DES to RCA  . Hyperlipidemia   . Hypertension   . Intolerance of drug    dyspnea with Brilinta >> changed to Effient   . Ischemic cardiomyopathy    a. EF 45-50% with inf HK at Inf STEMI // b. Echo in 10/15 with EF 55-60% with inf and inf-lat HK  . NSVT (nonsustained ventricular tachycardia) (HCC)    a. in the setting of inferior STEMI;  b. 02/2014 Echo: EF 55-60%, Gr 1 DD, basal-midinferolateral/inf HK, Gr 1 DD, triv TR.    Patient Active Problem List   Diagnosis Date Noted  . Chest pain 06/01/2017  . TIA (transient ischemic attack) 01/12/2017  . Chronic low back pain 03/11/2014  . Constipation 03/06/2014  . CAD (coronary artery disease), native coronary artery 03/04/2014  . Hyperlipidemia 03/04/2014   . Anxiety 03/04/2014  . History of acute inferior wall MI 02/28/2014    Past Surgical History:  Procedure Laterality Date  . CARDIAC CATHETERIZATION     02/28/2014 prox RCA single vessel occlusion treated with DES  . CORONARY ANGIOPLASTY WITH STENT PLACEMENT    . LEFT HEART CATH Bilateral 02/28/2014   Procedure: LEFT HEART CATH;  Surgeon: Iran Ouch, MD;  Location: [facility];  Service: Cardiovascular;  Laterality: Bilateral;       Home Medications    Prior to Admission medications   Medication Sig Start Date End Date Taking? Authorizing Provider  penicillin v potassium (VEETID) 500 MG tablet Take 1 tablet (500 mg total) by mouth 4 (four) times daily for 7 days. 09/24/18 10/01/18  Belinda Fisher, PA-C    Family History Family History  Problem Relation Age of Onset  . Hypertension Father   . Heart attack Brother 67    Social History Social History   Tobacco Use  . Smoking status: Former Games developer  . Smokeless tobacco: Never Used  Substance Use Topics  . Alcohol use: Yes    Alcohol/week: 0.0 standard drinks  . Drug use: No     Allergies   Patient has no known allergies.   Review of Systems Review of Systems  Reason unable to perform ROS: See HPI as above.  Physical Exam Triage Vital Signs ED Triage Vitals  Enc Vitals Group     BP      Pulse      Resp      Temp      Temp src      SpO2      Weight      Height      Head Circumference      Peak Flow      Pain Score      Pain Loc      Pain Edu?      Excl. in GC?    No data found.  Updated Vital Signs BP 140/88 (BP Location: Left Arm)   Pulse 80   Temp 97.8 F (36.6 C) (Oral)   Resp 16   SpO2 95%   Physical Exam Constitutional:      General: He is not in acute distress.    Appearance: He is well-developed. He is not ill-appearing, toxic-appearing or diaphoretic.  HENT:     Head: Normocephalic and atraumatic.     Jaw: No trismus.     Mouth/Throat:     Mouth: Mucous membranes are moist.      Pharynx: Oropharynx is clear. Uvula midline. No uvula swelling.      Comments: Tenderness to palpation of teeth noted above. Gum swelling with tenderness.   Floor of mouth soft to palpation. No facial swelling.  Neck:     Musculoskeletal: Normal range of motion and neck supple.  Skin:    General: Skin is warm and dry.  Neurological:     Mental Status: He is alert and oriented to person, place, and time.      UC Treatments / Results  Labs (all labs ordered are listed, but only abnormal results are displayed) Labs Reviewed - No data to display  EKG None  Radiology No results found.  Procedures Procedures (including critical care time)  Medications Ordered in UC Medications - No data to display  Initial Impression / Assessment and Plan / UC Course  I have reviewed the triage vital signs and the nursing notes.  Pertinent labs & imaging results that were available during my care of the patient were reviewed by me and considered in my medical decision making (see chart for details).    Will start PCN to cover for dental infection. However, discussed with patient pain could be due to irritation for nerve exposure given loosened teeth. Patient states ready to have teeth removed and will contact dentists. Return precautions given.   Final Clinical Impressions(s) / UC Diagnoses   Final diagnoses:  Chronic dental pain   ED Prescriptions    Medication Sig Dispense Auth. Provider   penicillin v potassium (VEETID) 500 MG tablet Take 1 tablet (500 mg total) by mouth 4 (four) times daily for 7 days. 28 tablet Threasa Alpha,  V, PA-C        ,  V, [place] 09/24/18 1426

## 2019-06-05 ENCOUNTER — Ambulatory Visit: Payer: Medicare Other | Admitting: Cardiology

## 2019-06-05 ENCOUNTER — Encounter: Payer: Self-pay | Admitting: *Deleted

## 2020-12-07 DIAGNOSIS — I10 Essential (primary) hypertension: Secondary | ICD-10-CM | POA: Insufficient documentation

## 2020-12-07 DIAGNOSIS — I493 Ventricular premature depolarization: Secondary | ICD-10-CM | POA: Insufficient documentation

## 2020-12-07 HISTORY — DX: Essential (primary) hypertension: I10

## 2020-12-07 HISTORY — DX: Ventricular premature depolarization: I49.3

## 2021-01-09 DIAGNOSIS — Z789 Other specified health status: Secondary | ICD-10-CM | POA: Insufficient documentation

## 2021-01-09 DIAGNOSIS — Z955 Presence of coronary angioplasty implant and graft: Secondary | ICD-10-CM

## 2021-01-09 HISTORY — DX: Other specified health status: Z78.9

## 2021-01-09 HISTORY — DX: Presence of coronary angioplasty implant and graft: Z95.5

## 2021-01-10 DIAGNOSIS — R42 Dizziness and giddiness: Secondary | ICD-10-CM

## 2021-01-10 HISTORY — DX: Dizziness and giddiness: R42

## 2021-09-15 ENCOUNTER — Encounter: Payer: Self-pay | Admitting: Physician Assistant

## 2021-11-10 ENCOUNTER — Ambulatory Visit: Payer: Medicare (Managed Care) | Admitting: Physician Assistant

## 2022-02-09 ENCOUNTER — Ambulatory Visit: Payer: Medicare (Managed Care) | Admitting: Physician Assistant

## 2022-04-06 ENCOUNTER — Encounter: Payer: Self-pay | Admitting: Physician Assistant

## 2022-04-06 ENCOUNTER — Other Ambulatory Visit (INDEPENDENT_AMBULATORY_CARE_PROVIDER_SITE_OTHER): Payer: Medicare (Managed Care)

## 2022-04-06 ENCOUNTER — Ambulatory Visit: Payer: Medicare (Managed Care) | Admitting: Physician Assistant

## 2022-04-06 VITALS — BP 195/95 | HR 75 | Resp 18 | Wt 197.0 lb

## 2022-04-06 DIAGNOSIS — R413 Other amnesia: Secondary | ICD-10-CM

## 2022-04-06 LAB — VITAMIN B12: Vitamin B-12: 389 pg/mL (ref 211–911)

## 2022-04-06 LAB — TSH: TSH: 0.58 u[IU]/mL (ref 0.35–5.50)

## 2022-04-06 NOTE — Patient Instructions (Addendum)
It was a pleasure to see you today at our office.   Recommendations:  Neurocognitive evaluation   MRI of the brain, the radiology office will call you to arrange you appointment Check labs today Follow up in 1 month to discuss the MRI finding  Follow up with PCP regarding the sciatica and BP meds  Make sure to control better the blood pressure   Whom to call:  Memory  decline, memory medications: Call our office [phone]   For psychiatric meds, mood meds: Please have your primary care physician manage these medications.      For assessment of decision of mental capacity and competency:  Call Dr. Erick Blinks, geriatric psychiatrist at [phone]  For guidance in geriatric dementia issues please call [facility] [phone]  For guidance regarding [facility] and if placement were needed at the facility, contact Sidney Ace, Social Worker tel: [phone]  If you have any severe symptoms of a stroke, or other severe issues such as confusion,severe chills or fever, etc call 911 or go to the ER as you may need to be evaluated further   Feel free to visit Facebook page " [email or web address]" for tips of how to care for people with memory problems.       RECOMMENDATIONS FOR ALL PATIENTS WITH MEMORY PROBLEMS: 1. Continue to exercise (Recommend 30 minutes of walking everyday, or 3 hours every week) 2. Increase social interactions - continue going to [place] and enjoy social gatherings with friends and family 3. Eat healthy, avoid fried foods and eat more fruits and vegetables 4. Maintain adequate blood pressure, blood sugar, and blood cholesterol level. Reducing the risk of stroke and cardiovascular disease also helps promoting better memory. 5. Avoid stressful situations. Live a simple life and avoid aggravations. Organize your time and prepare for the next day in anticipation. 6. Sleep well, avoid any interruptions of sleep and avoid any distractions in  the bedroom that may interfere with adequate sleep quality 7. Avoid sugar, avoid sweets as there is a strong link between excessive sugar intake, diabetes, and cognitive impairment We discussed the Mediterranean diet, which has been shown to help patients reduce the risk of progressive memory disorders and reduces cardiovascular risk. This includes eating fish, eat fruits and green leafy vegetables, nuts like almonds and hazelnuts, walnuts, and also use olive oil. Avoid fast foods and fried foods as much as possible. Avoid sweets and sugar as sugar use has been linked to worsening of memory function.  There is always a concern of gradual progression of memory problems. If this is the case, then we may need to adjust level of care according to patient needs. Support, both to the patient and caregiver, should then be put into place.      You have been referred for a neuropsychological evaluation (i.e., evaluation of memory and thinking abilities). Please bring someone with you to this appointment if possible, as it is helpful for the doctor to hear from both you and another adult who knows you well. Please bring eyeglasses and hearing aids if you wear them.    The evaluation will take approximately 3 hours and has two parts:   The first part is a clinical interview with the neuropsychologist (Dr. Milbert Coulter or Dr. Roseanne Reno). During the interview, the neuropsychologist will speak with you and the individual you brought to the appointment.    The second part of the evaluation is testing with the doctor's technician Annabelle Harman or Selena Batten). During the testing, the  technician will ask you to remember different types of material, solve problems, and answer some questionnaires. Your family member will not be present for this portion of the evaluation.   Please note: We must reserve several hours of the neuropsychologist's time and the psychometrician's time for your evaluation appointment. As such, there is a No-Show fee of  $100. If you are unable to attend any of your appointments, please contact our office as soon as possible to reschedule.    FALL PRECAUTIONS: Be cautious when walking. Scan the area for obstacles that may increase the risk of trips and falls. When getting up in the mornings, sit up at the edge of the bed for a few minutes before getting out of bed. Consider elevating the bed at the head end to avoid drop of blood pressure when getting up. Walk always in a well-lit room (use night lights in the walls). Avoid area rugs or power cords from appliances in the middle of the walkways. Use a walker or a cane if necessary and consider physical therapy for balance exercise. Get your eyesight checked regularly.  FINANCIAL OVERSIGHT: Supervision, especially oversight when making financial decisions or transactions is also recommended.  HOME SAFETY: Consider the safety of the kitchen when operating appliances like stoves, microwave oven, and blender. Consider having supervision and share cooking responsibilities until no longer able to participate in those. Accidents with firearms and other hazards in the house should be identified and addressed as well.   ABILITY TO BE LEFT ALONE: If patient is unable to contact 911 operator, consider using LifeLine, or when the need is there, arrange for someone to stay with patients. Smoking is a fire hazard, consider supervision or cessation. Risk of wandering should be assessed by caregiver and if detected at any point, supervision and safe proof recommendations should be instituted.  MEDICATION SUPERVISION: Inability to self-administer medication needs to be constantly addressed. Implement a mechanism to ensure safe administration of the medications.   DRIVING: Regarding driving, in patients with progressive memory problems, driving will be impaired. We advise to have someone else do the driving if trouble finding directions or if minor accidents are reported. Independent  driving assessment is available to determine safety of driving.   If you are interested in the driving assessment, you can contact the following:  [employer] in [place] [phone]  [facility] [phone]  [facility] [phone]  [facility] [phone] or [phone]    Mediterranean Diet A Mediterranean diet refers to food and lifestyle choices that are based on the traditions of countries located on the [place]. This way of eating has been shown to help prevent certain conditions and improve outcomes for people who have chronic diseases, like kidney disease and heart disease. What are tips for following this plan? Lifestyle  Cook and eat meals together with your family, when possible. Drink enough fluid to keep your urine clear or pale yellow. Be physically active every day. This includes: Aerobic exercise like running or swimming. Leisure activities like gardening, walking, or housework. Get 7-8 hours of sleep each night. If recommended by your health care provider, drink red wine in moderation. This means 1 glass a day for nonpregnant women and 2 glasses a day for men. A glass of wine equals 5 oz (150 mL). Reading food labels  Check the serving size of packaged foods. For foods such as rice and pasta, the serving size refers to the amount of cooked product, not dry. Check  the total fat in packaged foods. Avoid foods that have saturated fat or trans fats. Check the ingredients list for added sugars, such as corn syrup. Shopping  At the grocery store, buy most of your food from the areas near the walls of the store. This includes: Fresh fruits and vegetables (produce). Grains, beans, nuts, and seeds. Some of these may be available in unpackaged forms or large amounts (in bulk). Fresh seafood. Poultry and eggs. Low-fat dairy products. Buy whole ingredients instead of prepackaged foods. Buy fresh fruits and  vegetables in-season from local farmers markets. Buy frozen fruits and vegetables in resealable bags. If you do not have access to quality fresh seafood, buy precooked frozen shrimp or canned fish, such as tuna, salmon, or sardines. Buy small amounts of raw or cooked vegetables, salads, or olives from the deli or salad bar at your store. Stock your pantry so you always have certain foods on hand, such as olive oil, canned tuna, canned tomatoes, rice, pasta, and beans. Cooking  Cook foods with extra-virgin olive oil instead of using butter or other vegetable oils. Have meat as a side dish, and have vegetables or grains as your main dish. This means having meat in small portions or adding small amounts of meat to foods like pasta or stew. Use beans or vegetables instead of meat in common dishes like chili or lasagna. Experiment with different cooking methods. Try roasting or broiling vegetables instead of steaming or sauteing them. Add frozen vegetables to soups, stews, pasta, or rice. Add nuts or seeds for added healthy fat at each meal. You can add these to yogurt, salads, or vegetable dishes. Marinate fish or vegetables using olive oil, lemon juice, garlic, and fresh herbs. Meal planning  Plan to eat 1 vegetarian meal one day each week. Try to work up to 2 vegetarian meals, if possible. Eat seafood 2 or more times a week. Have healthy snacks readily available, such as: Vegetable sticks with hummus. Greek yogurt. Fruit and nut trail mix. Eat balanced meals throughout the week. This includes: Fruit: 2-3 servings a day Vegetables: 4-5 servings a day Low-fat dairy: 2 servings a day Fish, poultry, or lean meat: 1 serving a day Beans and legumes: 2 or more servings a week Nuts and seeds: 1-2 servings a day Whole grains: 6-8 servings a day Extra-virgin olive oil: 3-4 servings a day Limit red meat and sweets to only a few servings a month What are my food choices? Mediterranean  diet Recommended Grains: Whole-grain pasta. Brown rice. Bulgar wheat. Polenta. Couscous. Whole-wheat bread. Orpah Cobb. Vegetables: Artichokes. Beets. Broccoli. Cabbage. Carrots. Eggplant. Green beans. Chard. Kale. Spinach. Onions. Leeks. Peas. Squash. Tomatoes. Peppers. Radishes. Fruits: Apples. Apricots. Avocado. Berries. Bananas. Cherries. Dates. Figs. Grapes. Lemons. Melon. Oranges. Peaches. Plums. Pomegranate. Meats and other protein foods: Beans. Almonds. Sunflower seeds. Pine nuts. Peanuts. Cod. Salmon. Scallops. Shrimp. Tuna. Tilapia. Clams. Oysters. Eggs. Dairy: Low-fat milk. Cheese. Greek yogurt. Beverages: Water. Red wine. Herbal tea. Fats and oils: Extra virgin olive oil. Avocado oil. Grape seed oil. Sweets and desserts: [place] yogurt with honey. Baked apples. Poached pears. Trail mix. Seasoning and other foods: Basil. Cilantro. Coriander. Cumin. Mint. Parsley. Sage. Rosemary. Tarragon. Garlic. Oregano. Thyme. Pepper. Balsalmic vinegar. Tahini. Hummus. Tomato sauce. Olives. Mushrooms. Limit these Grains: Prepackaged pasta or rice dishes. Prepackaged cereal with added sugar. Vegetables: Deep fried potatoes (french fries). Fruits: Fruit canned in syrup. Meats and other protein foods: Beef. Pork. Lamb. Poultry with skin. Hot dogs. Tomasa Blase. Dairy: Ice cream. Sour  cream. Whole milk. Beverages: Juice. Sugar-sweetened soft drinks. Beer. Liquor and spirits. Fats and oils: Butter. Canola oil. Vegetable oil. Beef fat (tallow). Lard. Sweets and desserts: Cookies. Cakes. Pies. Candy. Seasoning and other foods: Mayonnaise. Premade sauces and marinades. The items listed may not be a complete list. Talk with your dietitian about what dietary choices are right for you. Summary The Mediterranean diet includes both food and lifestyle choices. Eat a variety of fresh fruits and vegetables, beans, nuts, seeds, and whole grains. Limit the amount of red meat and sweets that you eat. Talk with your  health care provider about whether it is safe for you to drink red wine in moderation. This means 1 glass a day for nonpregnant women and 2 glasses a day for men. A glass of wine equals 5 oz (150 mL). This information is not intended to replace advice given to you by your health care provider. Make sure you discuss any questions you have with your health care provider. Document Released: 12/30/2015 Document Revised: 02/01/2016 Document Reviewed: 12/30/2015 Elsevier Interactive Patient Education  2017 [employer].   Your provider has requested that you have labwork completed today. Please go to [facility] ([id number]) on the second floor of this building before leaving the office today. You do not need to check in. If you are not called within 15 minutes please check with the front desk.   We have sent a referral to [facility] for your MRI and they will call you directly to schedule your appointment. They are located at [address] [facility]. If you need to contact them directly please call [phone].

## 2022-04-06 NOTE — Progress Notes (Signed)
Left message on voicemail to return call at 2:26pm 04/06/2022

## 2022-04-06 NOTE — Progress Notes (Signed)
Assessment/Plan:   Mitchell [name] is a very pleasant 61 y.o. year old RH male with  a history of hypertension, hyperlipidemia, TIA 2018 (with L arm numbness blurred vision with neg MRA/MRI except for minimal SVD), "heat stroke" in 2012, CAD s/p MI, ICM, anxiety, depression, prior alcohol abuse, strong family history of Alzheimer's disease,  seen today for evaluation of memory loss. MoCA today is  22 /30. Memory impairment is suspected, workup is in progress   Memory Impairment  MRI brain without contrast to assess for underlying structural abnormality and assess vascular load  Neurocognitive testing to further evaluate cognitive concerns and determine other underlying cause of memory changes, including potential contribution from sleep, anxiety, or depression  Check B12, TSH, B1 EEG to rule out seizures  Recommend good control of cardiovascular risk factors. Continue ASA, follow with Cards.  It is noted that his blood pressure today is elevated at 195/95, he is not quite sure if he took his medications. Continue to control mood as per PCP.  Recommend psychotherapy Recommend sleep studies for evaluation of OSA, he will discuss with PCP    Subjective:   The patient is here alone.   How long did patient have memory difficulties? At least 5 years after the MI, worse over the last 18 months".   A couple of weeks ago I could not remember my son's names which was frightening. Sometimes cannot recall recent conversations. Does not do crossword puzzles and word finding . Since retiring he is not very active. "I stare at the phone a lot". Works around the yard.  repeats oneself? Endorsed "I do it a lot".    Disoriented when walking into a room? Sometimes cannot remember what he came to the room for  Leaving objects in unusual places?  Patient denies  I lose stuff quite frequently. If I don't put my wallet in a specific place I will forget.   Ambulates  with difficulty?   Patient denies    Recent falls?  "I fall all the time, 3-4 times in this year, lost my balance and got dizzy"  Any history of head injuries?  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 He denies any or taste, foam in his mouth, or neck pain during these events.  We will occur monthly or every 2 months. Wandering behavior?  Patient denies   Patient drives?  "I had an accident 4 years ago with the same foggy feeling "as mentioned above. I only drive short distances no more than 40 miles a month "I do not have a way to go " Any mood changes such irritability agitation?  Patient denies   Any history of depression?: Endorsed, he reports that he had become an alcoholic after he has been worse, apathetic weak in July 2022 after his heart attack.  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  He is not interested in psychotherapy. Hallucinations or paranoia?  Occasionally I see a shadow in the hallway and sometimes I think that the someone in the house ".  This is not very frequent, but has been present over the last year.    Patient reports that he has never slept well "sporadic at best, waking up at 2 AM and tossing and turning or  getting to walk.  Denies REM behavior or sleepwalking "I woken up frightened a couple of times ".   History of sleep apnea? OSA, he does not have a CPAP, he wakes up trying to catch his breath. Never had a sleep study.  Any hygiene concerns?  Endorsed, for the last month he had decreased interest in doing so. One time he went 5 days without it.   Independent of bathing and dressing?  "My wardrobe is not really extensive " does the patient needs help with medications? Patient in charge "even with the pillbox, he forgets to take them" Who is in charge of the finances?  Patient is in charge,  he is on top of that otherwise he will not be able to receive his Social Security check. Any changes in appetite?  "Goes up and down, some days I don't eat much and sometimes I eat out of boredom " Patient have trouble swallowing? Patient denies   Does the patient cook?  "I don't  leave the stove because will burn the house " Any headaches? Endorsed, "after the heart attack and I also hit my head several times, it is coming from the neck to above the head, usually when BP is up and when I am anxious ", according to Cards does not appear to be related to meds or orthostatic, being investigated if correlating with arrhythmias.  Patient denies   Any focal numbness or tingling?  Patient denies   Chronic back pain he has known sciatica and chronic back pain, he is to see a specialist soon.  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 He is not very interested in PT/OT Unilateral weakness?  Patient denies   Any tremors?  Patient denies   Any history of anosmia? Endorsed after Covid 06/2020 Any incontinence of urine? Endorsed, does not use diapers or pads Any bowel dysfunction?  History of   diarrhea History of heavy alcohol intake? 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  Patient denies   Family history of dementia? Both grandmothers had Alzheimer's disease and mother had Parkinson's disease Patient lives alone she is divorced many years ago  Past Medical History:  Diagnosis Date   Anxiety 03/04/2014   CAD (coronary artery disease)    a. 02/2014 Inf STEMI/PCI: LM nl, LAD min irregs, Diags 1/2/3 min irregs, LCX min irregs, OM1/2/3 min irregs, RI min irregs, RCA 100p (3.5x18 Xience DES), PDA/PL nl, EF 45-50% basal inf HK.   CAD (coronary artery disease), native coronary artery 03/04/2014   Chest pain 06/01/2017   Chronic low back pain 03/11/2014   Constipation 03/06/2014   History of acute  inferior wall MI    Tx with DES to RCA   Hyperlipidemia    Hypertension    Intolerance of drug    dyspnea with Brilinta >> changed to Effient    Ischemic cardiomyopathy    a. EF 45-50% with inf HK at Inf STEMI // b. Echo in 10/15 with EF 55-60% with inf and inf-lat HK   NSVT (nonsustained ventricular tachycardia) (HCC)    a. in the setting of inferior STEMI;  b. 02/2014 Echo: EF 55-60%, Gr 1 DD, basal-midinferolateral/inf HK, Gr 1 DD, triv TR.   TIA (transient ischemic attack) 01/12/2017     Past Surgical History:  Procedure Laterality Date   CARDIAC CATHETERIZATION     02/28/2014 prox  RCA single vessel occlusion treated with DES   CORONARY ANGIOPLASTY WITH STENT PLACEMENT     LEFT HEART CATH Bilateral 02/28/2014   Procedure: LEFT HEART CATH;  Surgeon: Iran OuchMuhammad A Arida, MD;  Location: [facility];  Service: Cardiovascular;  Laterality: Bilateral;   TONSILLECTOMY       Allergies  Allergen Reactions   Statins Other (See Comments)    Current Outpatient Medications  Medication Instructions   amLODipine (NORVASC) 5 mg, Oral, Daily   aspirin EC 81 MG tablet Oral   bisoprolol (ZEBETA) 5 MG tablet Take by mouth.   ezetimibe (ZETIA) 10 mg, Oral, Daily     VITALS:   Vitals:   04/06/22 0951  BP: (!) 195/95  Pulse: 75  Resp: 18  SpO2: 98%  Weight: 197 lb (89.4 kg)       No data to display          PHYSICAL EXAM   HEENT:  Normocephalic, atraumatic. The mucous membranes are moist. The superficial temporal arteries are without ropiness or tenderness. Cardiovascular: Regular rate and rhythm. Lungs: Clear to auscultation bilaterally. Neck: There are no carotid bruits noted bilaterally.  NEUROLOGICAL:    04/06/2022   10:00 AM  Montreal Cognitive Assessment   Visuospatial/ Executive (0/5) 5  Naming (0/3) 3  Attention: Read list of digits (0/2) 2  Attention: Read list of letters (0/1) 1  Attention: Serial 7 subtraction starting at 100 (0/3) 1  Language: Repeat phrase  (0/2) 1  Language : Fluency (0/1) 0  Abstraction (0/2) 1  Delayed Recall (0/5) 1  Orientation (0/6) 6  Total 21  Adjusted Score (based on education) 22        No data to display           Orientation:  Alert and oriented to person, place and time. No aphasia or dysarthria. Fund of knowledge is appropriate. Recent memory impaired and remote memory intact.  Attention and concentration are normal.  Able to name objects and repeat phrases. Delayed recall  1/5  Cranial nerves: There is good facial symmetry. Extraocular muscles are intact and visual fields are full to confrontational testing. Speech is fluent and clear. Soft palate rises symmetrically and there is no tongue deviation. Hearing is intact to conversational tone. Tone: Tone is good throughout. Sensation: Sensation is intact to light touch and pinprick throughout. Vibration is intact at the bilateral big toe.There is no extinction with double simultaneous stimulation. There is no sensory dermatomal level identified. Coordination: The patient has no difficulty with RAM's or FNF bilaterally. Normal finger to nose  Motor: Strength is 5/5 in the bilateral upper and lower extremities. There is no pronator drift. There are no fasciculations noted. DTR's: Deep tendon reflexes are 2/4 at the bilateral biceps, triceps, brachioradialis, patella and achilles.  Plantar responses are downgoing bilaterally. Gait and Station: The patient is able to ambulate with some difficulty due to arthritis. The patient is able to heel toe walk without any difficulty.The patient is able to ambulate in a tandem fashion. The patient is able to stand in the Romberg position.     Thank you for allowing us the opportunity to participate in the care of this nice patient. Please do not hesitate to contact us for any questions or concerns.   Total time spent on today's visit was 60 minutes dedicated to this patient today, preparing to see patient, examining the  patient, ordering tests and/or medications and counseling the patient, documenting clinical information in the EHR  or other health record, independently interpreting results and communicating results to the patient/family, discussing treatment and goals, answering patient's questions and coordinating care.  Cc:  Patient, No Pcp Per  Marlowe Kays 04/06/2022 10:43 AM

## 2022-04-06 NOTE — Progress Notes (Signed)
B12 is on the lower side, recommend B12 1000 mcg daily. Thyroid level is normal, waiting for B1, will likely take 2 days to get here. Thanks

## 2022-04-12 LAB — VITAMIN B1: Vitamin B1 (Thiamine): 13 nmol/L (ref 8–30)

## 2022-04-17 NOTE — Progress Notes (Signed)
B1 is normal, thanks

## 2022-04-21 ENCOUNTER — Other Ambulatory Visit: Payer: Medicare (Managed Care)

## 2022-05-01 ENCOUNTER — Encounter: Payer: Self-pay | Admitting: Psychology

## 2022-05-01 DIAGNOSIS — I4729 Other ventricular tachycardia: Secondary | ICD-10-CM | POA: Insufficient documentation

## 2022-05-01 DIAGNOSIS — I255 Ischemic cardiomyopathy: Secondary | ICD-10-CM | POA: Insufficient documentation

## 2022-05-02 ENCOUNTER — Ambulatory Visit (INDEPENDENT_AMBULATORY_CARE_PROVIDER_SITE_OTHER): Payer: Medicare (Managed Care) | Admitting: Psychology

## 2022-05-02 ENCOUNTER — Ambulatory Visit: Payer: Medicare (Managed Care)

## 2022-05-02 ENCOUNTER — Encounter: Payer: Self-pay | Admitting: Psychology

## 2022-05-02 DIAGNOSIS — F331 Major depressive disorder, recurrent, moderate: Secondary | ICD-10-CM | POA: Diagnosis not present

## 2022-05-02 DIAGNOSIS — R4189 Other symptoms and signs involving cognitive functions and awareness: Secondary | ICD-10-CM

## 2022-05-02 DIAGNOSIS — G3184 Mild cognitive impairment, so stated: Secondary | ICD-10-CM | POA: Insufficient documentation

## 2022-05-02 DIAGNOSIS — F1021 Alcohol dependence, in remission: Secondary | ICD-10-CM | POA: Insufficient documentation

## 2022-05-02 DIAGNOSIS — F411 Generalized anxiety disorder: Secondary | ICD-10-CM

## 2022-05-02 DIAGNOSIS — I252 Old myocardial infarction: Secondary | ICD-10-CM

## 2022-05-02 DIAGNOSIS — F329 Major depressive disorder, single episode, unspecified: Secondary | ICD-10-CM | POA: Insufficient documentation

## 2022-05-02 HISTORY — DX: Mild cognitive impairment of uncertain or unknown etiology: G31.84

## 2022-05-02 NOTE — Progress Notes (Signed)
   Psychometrician Note   Cognitive testing was administered to Mitchell [name] by Shan Levans, B.S. (psychometrist) under the supervision of Dr. Newman Nickels, Ph.D., licensed psychologist on 05/02/2022. Mitchell [name] did not appear overtly distressed by the testing session per behavioral observation or responses across self-report questionnaires. Rest breaks were offered.    The battery of tests administered was selected by Dr. Newman Nickels, Ph.D. with consideration to Mitchell [name]'s current level of functioning, the nature of his symptoms, emotional and behavioral responses during interview, level of literacy, observed level of motivation/effort, and the nature of the referral question. This battery was communicated to the psychometrist. Communication between Dr. Newman Nickels, Ph.D. and the psychometrist was ongoing throughout the evaluation and Dr. Newman Nickels, Ph.D. was immediately accessible at all times. Dr. Newman Nickels, Ph.D. provided supervision to the psychometrist on the date of this service to the extent necessary to assure the quality of all services provided.    Mitchell [name] will return within approximately 1-2 weeks for an interactive feedback session with Dr. Milbert Coulter at which time his test performances, clinical impressions, and treatment recommendations will be reviewed in detail. Mitchell [name] understands he can contact our office should he require our assistance before this time.  A total of 150 minutes of billable time were spent face-to-face with Mitchell [name] by the psychometrist. This includes both test administration and scoring time. Billing for these services is reflected in the clinical report generated by Dr. Newman Nickels, Ph.D.  This note reflects time spent with the psychometrician and does not include test scores or any clinical interpretations made by Dr. Milbert Coulter. The full report will follow in a separate note.

## 2022-05-02 NOTE — Progress Notes (Signed)
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 Exceptionally Low       Validity:   DESCRIPTOR       ACS Word Choice: --- --- Outside Normal Limits  Dot Counting Test: --- --- Outside Normal Limits  NAB EVI: --- --- Outside Normal Limits  D-KEFS Color Word Effort Index: --- --- Outside Normal Limits       Orientation:      Raw Score Percentile   NAB Orientation, Form 1 29/29 --- ---       Cognitive Screening:      Raw Score Percentile   SLUMS: 17/30 --- ---       Intellectual Functioning:      Standard Score Percentile   Test of Premorbid Functioning: 98 45 Average       Memory:     NAB Memory Module, Form 1: Standard Score/ T Score Percentile   Total Memory Index 84 14 Below Average  List Learning       Total Trials 1-3 16/36 (44) 27 Average    List B 4/12 (54) 66 Average    Short Delay Free Recall 4/12 (42) 21 Below Average    Long Delay Free Recall 3/12 (39) 14 Below Average    Retention Percentage 75 (41) 18 Below Average    Recognition Discriminability -12 (<19) <1 Exceptionally Low  Shape Learning       Total Trials 1-3 12/27 (45) 31 Average    Delayed Recall 5/9 (51) 54 Average    Retention Percentage 100 (51) 54  Average    Recognition Discriminability 3 (32) 4 Well Below Average  Story Learning       Immediate Recall 46/80 (43) 25 Average    Delayed Recall 16/40 (37) 9 Below Average    Retention Percentage 57 (26) 1 Exceptionally Low  Daily Living Memory       Immediate Recall 39/51 (50) 50 Average    Delayed Recall 7/17 (34) 5 Well Below Average    Retention Percentage 47 (<19) <1 Exceptionally Low    Recognition Hits 4/10 (<19) <1 Exceptionally Low       Attention/Executive Function:     Trail Making Test (TMT): Raw Score (T Score) Percentile     Part A 71 secs.,  0 errors (27) 1 Exceptionally Low    Part B 183 secs.,  0 errors (36) 8 Well Below Average         Scaled Score Percentile   WAIS-IV Coding: 4 2 Well Below Average       NAB Attention Module, Form 1: T Score Percentile     Digits Forward 43 25 Average    Digits Backwards 41 18 Below Average        Scaled Score Percentile   WAIS-IV Similarities: 5 5 Well Below Average  D-KEFS Color-Word Interference Test: Raw Score (Scaled Score) Percentile     Color Naming 53 secs. (1) <1 Exceptionally Low    Word Reading 36 secs. (4) 2 Well Below Average    Inhibition 105 secs. (4) 2 Well Below Average      Total Errors 1 error (11) 63 Average    Inhibition/Switching 131 secs. (2) <1 Exceptionally Low      Total Errors 4 errors (9) 37 Average       D-KEFS Verbal Fluency Test: Raw Score (Scaled Score) Percentile     Letter Total Correct 20 (5) 5 Well Below Average    Category Total Correct 23 (5) 5 Well Below Average    Category Switching Total Correct 9 (6) 9 Below Average    Category Switching Accuracy 7 (6) 9 Below Average      Total Set Loss Errors 1 (11) 63 Average      Total Repetition Errors 0 (13) 84 Above Average       NAB Executive Functions Module, Form 1: T Score Percentile     Judgment 51 54 Average       Language:     Verbal Fluency Test: Raw Score (T Score) Percentile     Phonemic Fluency (FAS) 20 (35) 7  Well Below Average    Animal Fluency 12 (37) 9 Below Average        NAB Language Module, Form 1: T Score Percentile     Auditory Comprehension 58 79 Above Average    Naming 31/31 (56) 73 Average       Visuospatial/Visuoconstruction:      Raw Score Percentile   Clock Drawing: 10/10 --- Within Normal Limits       NAB Spatial Module, Form 1: T Score Percentile     Figure Drawing Copy 67 96 Well Above Average        Scaled Score Percentile   WAIS-IV Block Design: 12 75 Above Average       Mood and Personality:      Raw Score Percentile   Beck Depression Inventory - II: 22 --- Moderate  PROMIS Anxiety Questionnaire: 28 --- Severe       Additional Questionnaires:      Raw Score Percentile   PROMIS Sleep Disturbance Questionnaire: 35 --- Moderate   Informed Consent and Coding/Compliance:   The current evaluation represents a clinical evaluation for the purposes previously outlined by the referral source and is in no way reflective of a forensic evaluation.   Mitchell [name]. [place] was provided with a verbal description of the nature and purpose of the present neuropsychological evaluation. Also reviewed were the foreseeable risks and/or discomforts and benefits of the procedure, limits of confidentiality, and mandatory reporting requirements of this provider. The patient was given the opportunity to ask questions and receive answers about the evaluation. Oral consent to participate was provided by the patient.   This evaluation was conducted by Christia Reading, Ph.D., ABPP-CN, board certified clinical neuropsychologist. Mitchell [name]. [place] completed a clinical interview with Dr. Melvyn Novas, billed as one unit [phone], and 150 minutes of cognitive testing and scoring, billed as one unit [phone] and four additional units [id number]. Psychometrist Cruzita Lederer, B.S., assisted Dr. Melvyn Novas with test administration and scoring procedures. As a separate and discrete service, Dr. Melvyn Novas spent a total of 155 minutes in  interpretation and report writing billed as one unit [phone] and two units 96133.

## 2022-05-09 ENCOUNTER — Encounter: Payer: Self-pay | Admitting: Physician Assistant

## 2022-05-09 ENCOUNTER — Encounter: Payer: Medicare (Managed Care) | Admitting: Psychology

## 2022-05-09 ENCOUNTER — Ambulatory Visit: Payer: Medicare (Managed Care) | Admitting: Physician Assistant

## 2022-05-10 ENCOUNTER — Ambulatory Visit (INDEPENDENT_AMBULATORY_CARE_PROVIDER_SITE_OTHER): Payer: Medicare (Managed Care) | Admitting: Psychology

## 2022-05-10 DIAGNOSIS — F331 Major depressive disorder, recurrent, moderate: Secondary | ICD-10-CM

## 2022-05-10 DIAGNOSIS — G459 Transient cerebral ischemic attack, unspecified: Secondary | ICD-10-CM | POA: Diagnosis not present

## 2022-05-10 DIAGNOSIS — F411 Generalized anxiety disorder: Secondary | ICD-10-CM

## 2022-05-10 DIAGNOSIS — G4720 Circadian rhythm sleep disorder, unspecified type: Secondary | ICD-10-CM

## 2022-05-10 NOTE — Progress Notes (Signed)
   Neuropsychology Feedback Session Mitchell [name]. [facility] Department of Neurology  Reason for Referral:   Mitchell [name] is a 61 y.o. right-handed Caucasian male referred by Marlowe Kays, PA-C, to characterize his current cognitive functioning and assist with diagnostic clarity and treatment planning in the context of subjective cognitive decline.   Feedback:   Mitchell [name] completed a comprehensive neuropsychological evaluation on 05/02/2022. Please refer to that encounter for the full report and recommendations. Briefly, results suggested primary weaknesses surrounding processing speed, verbal fluency (phonemic worse than semantic), and delayed retrieval/recognition aspects of verbal and visual memory. Further variability was exhibited across executive functioning; however, the majority of these scores were also below expectation. The etiology of ongoing dysfunction is unclear. Across mood-related questionnaires, he described moderate to severe levels of depression and anxiety respectively. During interview, he described himself as being extremely isolated and noted numerous stressors (especially surrounding financial limitations stemming from his disability). He also described moderate levels of sleep dysfunction across a related questionnaire and described notably "sporadic" and broken chronic sleep patterns. Especially as psychiatric distress becomes more severe, cognitive abilities can be directly impacted. This would especially be true across domains of processing speed, executive functioning, and learning and memory. Chronically poor sleep and untreated obstructive sleep apnea, as well as a history of alcohol abuse/dependence would also have a similar impact. There could be a further vascular contribution given ailments in his medical history. Prior neuroimaging revealed minimal microvascular disease; however, this was in 2018 and he is scheduled to have an updated scan  completed in the next few weeks. At the present time, I do not have compelling evidence to suggest the presence of a neurodegenerative illness.  Mitchell [name] was unaccompanied during the current telephone call. He was within his residence while I was within my office. I discussed the limitations of evaluation and management by telemedicine and the availability of in person appointments. Mitchell [name] expressed his understanding and agreed to proceed. Content of the current session focused on the results of his neuropsychological evaluation. Mitchell [name] was given the opportunity to ask questions and his questions were answered. He was encouraged to reach out should additional questions arise. A copy of his report was mailed at the conclusion of the visit.      25 minutes were spent conducting the current feedback session with Mitchell [name], billed as one unit [phone].

## 2022-05-10 NOTE — Addendum Note (Signed)
Addended by: Rosann Auerbach C on: 05/10/2022 01:47 PM   Modules accepted: Orders

## 2022-05-11 ENCOUNTER — Ambulatory Visit
Admission: RE | Admit: 2022-05-11 | Discharge: 2022-05-11 | Disposition: A | Discharge status: Home / Self Care | Payer: Medicare (Managed Care) | Source: Ambulatory Visit | Attending: Physician Assistant | Admitting: Physician Assistant

## 2022-05-11 ENCOUNTER — Ambulatory Visit: Payer: Medicare (Managed Care) | Admitting: Physician Assistant

## 2022-05-14 NOTE — Progress Notes (Signed)
MRI brain show age related known hardening of the arteries, some atrophy, no acute findings , thanks

## 2022-05-16 ENCOUNTER — Other Ambulatory Visit: Payer: Medicare (Managed Care)

## 2022-05-16 NOTE — Progress Notes (Signed)
Mail box is full at 10:38am

## 2022-05-19 ENCOUNTER — Ambulatory Visit (INDEPENDENT_AMBULATORY_CARE_PROVIDER_SITE_OTHER): Payer: Medicare (Managed Care) | Admitting: Neurology

## 2022-05-19 DIAGNOSIS — R413 Other amnesia: Secondary | ICD-10-CM | POA: Diagnosis not present

## 2022-05-19 NOTE — Progress Notes (Signed)
EEG complete - results pending 

## 2022-05-23 NOTE — Procedures (Signed)
ELECTROENCEPHALOGRAM REPORT  Date of Study: 05/19/2022  Patient's Name: Mitchell [name] MRN: [id number] Date of Birth: 1961/03/03  Referring Provider: Sharene Butters, PA-C  Clinical History: This is a 62 year old man with episodes of feeling foggy brained, unable to control movements, "almost go into a trance." EEG for classification.  Medications: Aspirin, Norvasc, bisoprolol, Ezetemibe  Technical Summary: A multichannel digital EEG recording measured by the international 10-20 system with electrodes applied with paste and impedances below 5000 ohms performed in our laboratory with EKG monitoring in an awake patient.  Hyperventilation was not performed. Photic stimulation was performed.  The digital EEG was referentially recorded, reformatted, and digitally filtered in a variety of bipolar and referential montages for optimal display.    Description: The patient is awake during the recording.  During maximal wakefulness, there is a symmetric, medium voltage 9-10 Hz posterior dominant rhythm that attenuates with eye opening.  The record is symmetric. Sleep was not captured. Photic stimulation did not elicit any abnormalities.  There were no epileptiform discharges or electrographic seizures seen.    EKG lead was unremarkable.  Impression: This awake EEG is normal.    Clinical Correlation: A normal EEG does not exclude a clinical diagnosis of epilepsy.  If further clinical questions remain, prolonged EEG may be helpful.  Clinical correlation is advised.   Ellouise Newer, M.D.

## 2022-05-23 NOTE — Progress Notes (Signed)
EEG is normal, thanks 

## 2022-05-24 ENCOUNTER — Encounter: Payer: Self-pay | Admitting: Physician Assistant

## 2022-05-24 ENCOUNTER — Telehealth (INDEPENDENT_AMBULATORY_CARE_PROVIDER_SITE_OTHER): Payer: Medicare (Managed Care) | Admitting: Physician Assistant

## 2022-05-24 VITALS — Ht 70.5 in | Wt 187.0 lb

## 2022-05-24 DIAGNOSIS — G3184 Mild cognitive impairment, so stated: Secondary | ICD-10-CM

## 2022-05-24 NOTE — Progress Notes (Signed)
Has video visit today, will note then

## 2022-05-24 NOTE — Progress Notes (Signed)
Virtual Visit via Video Note The purpose of this virtual visit is to provide medical care in a patient that is unable to be seen in person due to physical or health limitations   Consent was obtained for video visit:  yes  Answered questions that patient had about telehealth interaction:  yes I discussed the limitations, risks, security and privacy concerns of performing an evaluation and management service by telemedicine. I also discussed with the patient that there may be a patient responsible charge related to this service. The patient expressed understanding and agreed to proceed.  Pt location: Home Physician Location: office Name of referring provider:  No ref. provider found I connected with Mitchell [name] at patients initiation/request on 05/24/2022 at 11:00 AM EST by video enabled telemedicine application and verified that I am speaking with the correct person using two identifiers. Pt MRN:  [id number] Pt DOB:  [birth year] Video Participants:  Mitchell [name];       Assessment and Plan:    Mitchell [name]  is a 62 y.o. RH male e with  a history of hypertension, hyperlipidemia, TIA 2018 (with L arm numbness blurred vision with neg MRA/MRI except for minimal SVD), "heat stroke" in 2012, CAD s/p MI, ICM, anxiety, depression, prior alcohol abuse, strong family history of Alzheimer's disease  seen today in follow up via video visit for memory loss.  EEG was negative for seizures. Personally reviewed MRI of the brain 05/11/2022 was negative for acute intracranial abnormalities, mild chronic small vessel ischemic changes within the cerebral white matter, minimally progressed from the prior brain MRI in August 2018, no age advanced or lobar predominant parenchymal atrophy.  He had a Neuropsych evaluation yielding a diagnosis of mild cognitive impairment of unclear etiology, possibly with a psychiatric component, as well as sleep dysfunction (possible OSA).   Mild neurocognitive  disorder (mild cognitive impairment), etiology unclear.  Recommendations Recommend good control of cardiovascular risk factors, continue aspirin, follow with cardiology. Recommend psychotherapy for anxiety and depression, and mood control medicines as per PCP. Recommend sleep studies for evaluation of OSA, follow-up with PCP Repeat neuropsychological evaluation in 18 to 24 months for clarity of the diagnosis and disease progression  No antidementia meds are indicated at this time, will reassess once the above issues are addressed.    History of Present Illness:   Patient was last seen on 04/06/2022 as a new patient.  At that time, his MoCA was 22/30.  He is not on antidementia medication   Any changes in memory since last visit?. Patient has some difficulty remembering recent conversations and people names, "not new, but I understand now I need to take care of my sleep and anxiety"  repeats oneself?  Endorsed Disoriented when walking into a room?  Patient denies except occasionally not remembering what patient came to the room for.   Leaving objects in unusual places?  Patient denies   Wandering behavior?   denies   Any personality changes since last visit? " I am experiencing isolation, sadness, and I am ready to seek help, willing to give 100 percent" Any worsening depression?: denies   Hallucinations or paranoia?  denies   Seizures?   denies    Any sleep changes? Sleeps no more than 3 hours. Does not feel rested.  Denies  vivid dreams, REM behavior or sleepwalking   Sleep apnea? He is ready to be evaluated, reports that he will discuss with PCP Any hygiene concerns?   denies   Independent  of bathing and dressing?  Endorsed  Does the patient needs help with medications? Patient is in charge," Sometimes I think I took it but I am not sure"  Who is in charge of the finances?  Patient  is in charge. He is on [employer] so gets paid once a month, uses the App.     Any changes in  appetite?  "Sometimes I eat too much because of boredom".     Patient have trouble swallowing?  denies   Does the patient cook? Endorsed  Any kitchen accidents such as leaving the stove on? Endorsed." I have to remember better to not leave stuff on the stove and walk away"    Any headaches?  Sometimes, when BP is high, he reports frontal headaches Vision changes? When BP is high, he may experience blurriness  Chronic back pain  denies   Ambulates with difficulty?    denies   Recent falls or head injuries?    denies     Unilateral weakness, numbness or tingling?   denies   Any tremors?  denies   Any anosmia?    denies   Any incontinence of urine?  denies   Any bowel dysfunction?  denies      Patient lives   alone Does the patient drive? " drive very little, just the trip to the doctor's office is stressful, I am also on SS, so  I don' T want the cost of the drive      Initial visit 04/06/2022 How long did patient have memory difficulties? At least 5 years after the MI, worse over the last 18 months".   A couple of weeks ago I could not remember my son's names which was frightening. Sometimes cannot recall recent conversations. Does not do crossword puzzles and word finding . Since retiring he is not very active. "I stare at the phone a lot". Works around the yard.  repeats oneself? Endorsed "I do it a lot".    Disoriented when walking into a room? Sometimes cannot remember what he came to the room for  Leaving objects in unusual places?  Patient denies  I lose stuff quite frequently. If I don't put my wallet in a specific place I will forget.   Ambulates  with difficulty?   Patient denies   Recent falls?  "I fall all the time, 3-4 times in this year, lost my balance and got dizzy"  Any history of head injuries?  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 He denies any or taste, foam in  his mouth, or neck pain during these events.  We will occur monthly or every 2 months. Wandering behavior?  Patient denies   Patient drives?  "I had an accident 4 years ago with the same foggy feeling "as mentioned above. I only drive short distances no more than 40 miles a month "I do not have a way to go " Any mood changes such irritability agitation?  Patient denies   Any history of depression?: Endorsed, he reports that he had become an alcoholic after he has been worse, apathetic weak in July 2022 after his heart attack.  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  He is not interested in psychotherapy. Hallucinations or paranoia?  Occasionally  I see a shadow in the hallway and sometimes I think that the someone in the house ".  This is not very frequent, but has been present over the last year.    Patient reports that he has never slept well "sporadic at best, waking up at 2 AM and tossing and turning or getting to walk.  Denies REM behavior or sleepwalking "I woken up frightened a couple of times ".   History of sleep apnea? OSA, he does not have a CPAP, he wakes up trying to catch his breath. Never had a sleep study.  Any hygiene concerns?  Endorsed, for the last month he had decreased interest in doing so. One time he went 5 days without it.   Independent of bathing and dressing?  "My wardrobe is not really extensive " does the patient needs help with medications? Patient in charge "even with the pillbox, he forgets to take them" Who is in charge of the finances?  Patient is in charge, he is on top of that otherwise he will not be able to receive his Social Security check. Any changes in appetite?  "Goes up and down, some days I don't eat much and sometimes I eat out of boredom " Patient have trouble swallowing? Patient denies   Does the patient cook?  "I don't  leave the stove because  will burn the house " Any headaches? Endorsed, "after the heart attack and I also hit my head several times, it is coming from the neck to above the head, usually when BP is up and when I am anxious ", according to Cards does not appear to be related to meds or orthostatic, being investigated if correlating with arrhythmias.  Patient denies   Any focal numbness or tingling?  Patient denies   Chronic back pain he has known sciatica and chronic back pain, he is to see a specialist soon.  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 He is not very interested in PT/OT Unilateral weakness?  Patient denies   Any tremors?  Patient denies   Any history of anosmia? Endorsed after Covid 06/2020 Any incontinence of urine? Endorsed, does not use diapers or pads Any bowel dysfunction?  History of   diarrhea History of heavy alcohol intake? 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  Patient denies   Family history of dementia? Both grandmothers had Alzheimer's disease and mother had Parkinson's disease Patient lives alone she is divorced many years ago   Neuropsych evaluation, Dr. Milbert Coulter 05/02/22 . Dr. Milbert Coulter Briefly, results suggested primary weaknesses surrounding processing speed, verbal fluency (phonemic worse than semantic), and delayed retrieval/recognition aspects of verbal and visual memory. Further variability was exhibited across executive functioning; however, the majority of these scores were also below expectation. The etiology of ongoing dysfunction is unclear. Across mood-related questionnaires, he described moderate to severe levels of depression and anxiety respectively. During interview, he described himself as being extremely isolated and noted numerous stressors (especially surrounding financial limitations stemming from his disability). He also described moderate levels of sleep dysfunction  across a related questionnaire and described notably "sporadic" and broken chronic sleep patterns. Especially as psychiatric distress becomes more severe, cognitive abilities can be directly impacted. This would especially be true across domains of processing speed, executive functioning, and learning and memory. Chronically poor sleep and untreated obstructive sleep apnea, as well as a  history of alcohol abuse/dependence would also have a similar impact. There could be a further vascular contribution given ailments in his medical history.  At the present time, I do not have compelling evidence to suggest the presence of a neurodegenerative illness.  Personally reviewed MRI of the brain 05/11/2022 was negative for acute intracranial abnormalities, mild chronic small vessel ischemic changes within the cerebral white matter, minimally progressed from the prior brain MRI in August 2018, no age advanced or lobar predominant parenchymal atrophy     Current Outpatient Medications on File Prior to Visit  Medication Sig Dispense Refill   amLODipine (NORVASC) 5 MG tablet Take 5 mg by mouth daily.     aspirin EC 81 MG tablet Take by mouth.     bisoprolol (ZEBETA) 5 MG tablet Take by mouth. (Patient not taking: Reported on 04/06/2022)     ezetimibe (ZETIA) 10 MG tablet Take 10 mg by mouth daily.     No current facility-administered medications on file prior to visit.     Observations/Objective:   There were no vitals filed for this visit. GEN:  The patient appears stated age and is in NAD.  Neurological examination: could not be performed, he had technical difficulties and was unable to see patient on video    Follow Up Instructions:    -I discussed the assessment and treatment plan with the patient. The patient was provided an opportunity to ask questions and all were answered. The patient agreed with the plan and demonstrated an understanding of the instructions.   The patient was advised to call  back or seek an in-person evaluation if the symptoms worsen or if the condition fails to improve as anticipated.    Total time spent on today's visit was 58minutes, including both face-to-face time and nonface-to-face time.  Time included that spent on review of records (prior notes available to me/labs/imaging if pertinent), discussing treatment and goals, answering patient's questions and coordinating care.   Sharene Butters, PA-C
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
## 2022-07-18 ENCOUNTER — Encounter (HOSPITAL_COMMUNITY): Payer: Self-pay | Admitting: Psychiatry

## 2022-07-18 ENCOUNTER — Encounter (HOSPITAL_COMMUNITY): Payer: Self-pay

## 2022-07-18 ENCOUNTER — Ambulatory Visit (HOSPITAL_BASED_OUTPATIENT_CLINIC_OR_DEPARTMENT_OTHER): Payer: Medicare (Managed Care) | Admitting: Psychiatry

## 2022-07-18 DIAGNOSIS — F411 Generalized anxiety disorder: Secondary | ICD-10-CM

## 2022-07-18 DIAGNOSIS — F331 Major depressive disorder, recurrent, moderate: Secondary | ICD-10-CM | POA: Diagnosis not present

## 2022-07-18 DIAGNOSIS — G3184 Mild cognitive impairment, so stated: Secondary | ICD-10-CM | POA: Diagnosis not present

## 2022-07-18 MED ORDER — TRAZODONE HCL 50 MG PO TABS: 50.0000 mg | ORAL_TABLET | Freq: Every day | ORAL | 1 refills | Status: DC

## 2022-07-18 MED ORDER — FLUOXETINE HCL 10 MG PO CAPS: ORAL_CAPSULE | ORAL | 2 refills | Status: DC

## 2022-07-18 NOTE — Patient Instructions (Addendum)
Thank you for attending your appointment today.  - Start Prozac 10 mg daily and increase to 20 mg daily after 1 week and take that until - Start trazodone 50-100 mg at bedtime as needed for insomnia - If medication cost is a problem you can look into [facility] in [place]. They tend to be a cheaper option and will mail you the medications.  - Will plan to see you again in a month.   Please do not make any changes to medications without first discussing with your provider. If you are experiencing a psychiatric emergency, please call 911 or present to your nearest emergency department. Additional crisis, medication management, and therapy resources are included below.  [facility]  [address] [place], [place] [address] [phone] or [phone] [facility] 24/7 FOR ANYONE [address], [place], [place] Fax: [phone] [email or web address] *Interpreters available *Accepts all insurance and uninsured for Urgent Care needs

## 2022-08-13 NOTE — Progress Notes (Unsigned)
No chief complaint on file.  History of Present Illness: 62 yo male with history of CAD, prior alcohol abuse, HTN, hyperlipidemia, anxiety, depression, prior TIA, PVCs and NSVT who is here today as a new patient to re-establish care in our office. He had been followed in our group by Dr. Aundra Dubin and then was seen in 2019 by Dr. Sallyanne Kuster. 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 A drug eluting stent was placed in the RCA. Echo in 2015 with LVEF=55-60%. He was admitted to [facility] in 2019 and was seen then by Dr. Sallyanne Kuster for evaluation of chest pain in setting of normal troponin. Nuclear stress test without ischemia but evidence of piror inferior infarct. He was admitted to [facility] in July 2022 with chest pain. Echo July 2022 with LVEF=55-60%. Cardiac cath July 2022 at [facility] with mild disease in the LAD and Circumflex and patent RCA stent. Of note, he has not tolerated statins and had dyspnea with Brilinta.   Primary Care Physician: Patient, No Pcp Per   Past Medical History:  Diagnosis Date   Alcohol dependence in sustained full remission    Sober for past 1 year (05/02/22)   Benign essential hypertension 12/07/2020   Chest pain 06/01/2017   Chronic low back pain 03/11/2014   Constipation 03/06/2014   Coronary artery disease of native artery of native heart with stable angina pectoris 03/04/2014   a. 02/2014 Inf STEMI/PCI: LM nl, LAD min irregs, Diags 1/2/3 min irregs, LCX min irregs, OM1/2/3 min irregs, RI min irregs, RCA 100p (3.5x18 Xience DES), PDA/PL nl, EF 45-50% basal inf HK.   Dizziness, nonspecific 01/10/2021   Generalized anxiety disorder 03/04/2014   History of acute inferior wall MI 02/2014   Tx with DES to RCA   History of COVID-19 06/2018   Hyperlipidemia    Intolerance of drug    dyspnea with Brilinta >> changed to Effient    Ischemic cardiomyopathy    a. EF 45-50% with inf HK at Inf STEMI // b. Echo in 10/15 with EF 55-60% with  inf and inf-lat HK   Major depressive disorder    Mild cognitive impairment of uncertain or unknown etiology 05/02/2022   NSVT (nonsustained ventricular tachycardia)    a. in the setting of inferior STEMI;  b. 02/2014 Echo: EF 55-60%, Gr 1 DD, basal-midinferolateral/inf HK, Gr 1 DD, triv TR.   Statin intolerance 01/09/2021   Status post coronary artery stent placement    inferior STEMI 02/2014 with 100% RCA, (single vessel disease) stented with Xience DES, EF 55-60%.  Patent stent by cath 12/08/2020.   TIA (transient ischemic attack) 01/12/2017   Ventricular ectopy 12/07/2020    Past Surgical History:  Procedure Laterality Date   CARDIAC CATHETERIZATION     02/28/2014 prox RCA single vessel occlusion treated with DES   CORONARY ANGIOPLASTY WITH STENT PLACEMENT     LEFT HEART CATH Bilateral 02/28/2014   Procedure: LEFT HEART CATH;  Surgeon: Wellington Hampshire, MD;  Location: [facility];  Service: Cardiovascular;  Laterality: Bilateral;   TONSILLECTOMY      Current Outpatient Medications  Medication Sig Dispense Refill   amLODipine (NORVASC) 5 MG tablet Take 5 mg by mouth daily.     aspirin EC 81 MG tablet Take by mouth.     bisoprolol (ZEBETA) 5 MG tablet Take by mouth. (Patient not taking: Reported on 05/24/2022)     ezetimibe (ZETIA) 10 MG tablet Take  10 mg by mouth daily.     FLUoxetine (PROZAC) 10 MG capsule Take 1 capsule (10 mg total) by mouth daily for 7 days, THEN 2 capsules (20 mg total) daily for 23 days. 53 capsule 2   traZODone (DESYREL) 50 MG tablet Take 1-2 tablets (50-100 mg total) by mouth at bedtime. 60 tablet 1   No current facility-administered medications for this visit.    Allergies  Allergen Reactions   Statins Other (See Comments)    Social History   Socioeconomic History   Marital status: Divorced    Spouse name: Not on file   Number of children: 1   Years of education: 9   Highest education level: 9th grade  Occupational History   Occupation:  Disability    Comment: telephone lineman  Tobacco Use   Smoking status: Former   Smokeless tobacco: Never  Scientific laboratory technician Use: Never used  Substance and Sexual Activity   Alcohol use: Not Currently    Comment: hx of significant alcohol abuse; sober for past 1 year (05/02/22)   Drug use: No   Sexual activity: Not on file  Other Topics Concern   Not on file  Social History Narrative   ** Merged History Encounter **    Right handed   Drinks caffeine   Single story   Divorced 23 years, one dog   Social Determinants of Health   Financial Resource Strain: Not on file  Food Insecurity: Not on file  Transportation Needs: Not on file  Physical Activity: Not on file  Stress: Not on file  Social Connections: Not on file  Intimate Partner Violence: Not on file    Family History  Problem Relation Age of Onset   Parkinson's disease Mother    Hypertension Father    Heart attack Brother 32   Dementia Maternal Grandmother    Dementia Paternal Grandmother     Review of Systems:  As stated in the HPI and otherwise negative.   There were no vitals taken for this visit.  Physical Examination: General: Well developed, well nourished, NAD  HEENT: OP clear, mucus membranes moist  SKIN: warm, dry. No rashes. Neuro: No focal deficits  Musculoskeletal: Muscle strength 5/5 all ext  Psychiatric: Mood and affect normal  Neck: No JVD, no carotid bruits, no thyromegaly, no lymphadenopathy.  Lungs:Clear bilaterally, no wheezes, rhonci, crackles Cardiovascular: Regular rate and rhythm. No murmurs, gallops or rubs. Abdomen:Soft. Bowel sounds present. Non-tender.  Extremities: No lower extremity edema. Pulses are 2 + in the bilateral DP/PT.  EKG:  EKG {ACTION; IS/IS GI:[id number] ordered today. The ekg ordered today demonstrates ***  Recent Labs: 04/06/2022: TSH 0.58   Lipid Panel    Component Value Date/Time   CHOL 159 06/02/2017 0535   TRIG 217 (H) 06/02/2017 0535   HDL 41  06/02/2017 0535   CHOLHDL 3.9 06/02/2017 0535   VLDL 43 (H) 06/02/2017 0535   LDLCALC 75 06/02/2017 0535     Wt Readings from Last 3 Encounters:  05/24/22 84.8 kg  04/06/22 89.4 kg  11/20/17 98.4 kg      Assessment and Plan:   1. CAD without angina:   2. HTN:   3. Hyperlipidemia:   Labs/ tests ordered today include:  No orders of the defined types were placed in this encounter.    Disposition:   F/U with me in ***    Signed, Lauree Chandler, MD, [facility] 08/13/2022 6:32 PM    [facility]  [address], [place], [place]  [address] Phone: [phone]; Fax: [phone]    

## 2022-08-14 ENCOUNTER — Ambulatory Visit: Payer: Medicare (Managed Care) | Attending: Cardiovascular Disease | Admitting: Cardiovascular Disease

## 2022-08-14 ENCOUNTER — Encounter: Payer: Self-pay | Admitting: Cardiovascular Disease

## 2022-08-14 VITALS — BP 134/64 | HR 50 | Ht 70.5 in | Wt 181.0 lb

## 2022-08-14 DIAGNOSIS — E782 Mixed hyperlipidemia: Secondary | ICD-10-CM

## 2022-08-14 DIAGNOSIS — I25118 Atherosclerotic heart disease of native coronary artery with other forms of angina pectoris: Secondary | ICD-10-CM

## 2022-08-14 DIAGNOSIS — I10 Essential (primary) hypertension: Secondary | ICD-10-CM

## 2022-08-14 MED ORDER — ISOSORBIDE MONONITRATE ER 30 MG PO TB24: 15.0000 mg | ORAL_TABLET | Freq: Every day | ORAL | 3 refills | Status: DC

## 2022-08-14 NOTE — Patient Instructions (Signed)
Medication Instructions:  Your physician has recommended you make the following change in your medication:  1.) start isosorbide 30 mg tablet - take 15 mg (HALF TAB) daily  *If you need a refill on your cardiac medications before your next appointment, please call your pharmacy*   Lab Work: none If you have labs (blood work) drawn today and your tests are completely normal, you will receive your results only by: [place] (if you have MyChart) OR A paper copy in the mail If you have any lab test that is abnormal or we need to change your treatment, we will call you to review the results.   Testing/Procedures: none   Follow-Up: At [facility], you and your health needs are our priority.  As part of our continuing mission to provide you with exceptional heart care, we have created designated Provider Care Teams.  These Care Teams include your primary Cardiologist (physician) and Advanced Practice Providers (APPs -  Physician Assistants and Nurse Practitioners) who all work together to provide you with the care you need, when you need it.   Your next appointment:   12 month(s)  Provider:   Lauree Chandler, MD

## 2022-08-17 ENCOUNTER — Telehealth (HOSPITAL_BASED_OUTPATIENT_CLINIC_OR_DEPARTMENT_OTHER): Payer: Medicare (Managed Care) | Admitting: Psychiatry

## 2022-08-17 ENCOUNTER — Encounter (HOSPITAL_COMMUNITY): Payer: Self-pay | Admitting: Psychiatry

## 2022-08-17 DIAGNOSIS — F411 Generalized anxiety disorder: Secondary | ICD-10-CM

## 2022-08-17 DIAGNOSIS — F331 Major depressive disorder, recurrent, moderate: Secondary | ICD-10-CM

## 2022-08-17 DIAGNOSIS — G3184 Mild cognitive impairment, so stated: Secondary | ICD-10-CM

## 2022-08-17 MED ORDER — TRAZODONE HCL 50 MG PO TABS: 75.0000 mg | ORAL_TABLET | Freq: Every day | ORAL | 2 refills | Status: DC

## 2022-08-17 MED ORDER — FLUOXETINE HCL 20 MG PO CAPS: 20.0000 mg | ORAL_CAPSULE | Freq: Every day | ORAL | 2 refills | Status: DC

## 2022-08-17 NOTE — Progress Notes (Signed)
[place] MD/PA/NP OP Progress Note  08/17/2022 7:51 AM Mitchell [name]  MRN:  [id number]  Visit Diagnosis:    ICD-10-CM   1. Moderate episode of recurrent major depressive disorder (HCC)  F33.1     2. Generalized anxiety disorder  F41.1     3. Mild cognitive impairment of uncertain or unknown etiology  G31.84       Assessment: Mitchell [name] is a 62 y.o. male with a history of hypertension, hyperlipidemia, TIA 2018 (with L arm numbness blurred vision with neg MRA/MRI except for minimal SVD), "heat stroke" in 2012, CAD s/p MI, ICM, anxiety, depression, prior alcohol abuse in sustained remission, strong family history of Alzheimer's diseasewho presented to [place] [facility] for initial evaluation on 07/18/2022.  During initial evaluation patient reported neurovegetative symptoms of depression including low mood, insomnia, worthlessness, negative self thoughts, fatigue, difficulty concentrating, and passive SI without intent or plan.  He also endorsed significant symptoms of anxiety including constant worry that he is unable to control, difficulty relaxing, restlessness, and increased irritability.  Of note patient had been having memory issues such as forgetting to turn off the stove or water.  Neuropsych testing was performed which showed mild cognitive impairment of unclear etiology, with depression/anxiety being likely causes.  Neuro workup was negative on EEG and MRI showed only minimal progression in small vessel ischemic changes that is less likely to be consistent with the severity and dropping his cognition/memory. Psychosocially patient is struggling with increased isolation and financial stressors.  He met criteria for MDD and generalized anxiety disorder.  Mitchell [name] presents for follow-up evaluation. Today, 08/17/22, patient reports no significant change in mood or memory over the past month, though has had an improvement in his sleep. He has some  oversedation in the mornings likely secondary to the trazodone and it was suggested he try trazodone 75 mg. He had also endorsed headaches, dry mouth, and increased tinnitus since starting Prozac, though notes that these are improving with time. We will continue Prozac at 20 mg and consider increasing the dose in the future.   Plan: - Continue Prozac 20 mg - Increase Trazodone 50-100 mg QHS prn for insomnia - Neuropsych testing from 05/02/22 reviewed, recommend repeat in 18-24 months - Recommend referral to sleep specialist - MRI brain from 05/11/22 reviewed, showing mild chronic small vessel ischemic changes minimally progressed from the brain MRI in August 2018 - EEG WNL - Crisis resources reviewed - Follow up in 6 weeks    Chief Complaint:  Chief Complaint  Patient presents with   Follow-up   HPI: Mitchell [name] presents today reporting that he is doing alright, though a bit tired. He has been taking the Prozac and trazodone as prescribed and notes that he is on Trazodone 100 mg at bedtime currently. He had found the 50 mg dose to be ineffective and thus increased to 100 mg. The 100 mg dose has been helpful in having him fall to sleep and when he wakes up in the middle of the night. While he is still up for a few hours during the night, he has been able to fall asleep now after which he had not been able to do previously. The only adverse side effect he has noticed from the trazodone is over sedation the following morning. He would typically sleep until 9-10 and reported that he does plan to go back to bed after this appointment. While patient is not overly concerned about the morning sedation  we did discuss the possibility of trying trazodone 75 mg to see if that decreases the oversedation the following day.  As for the Prozac, patient notes that he has not noticed any marked improvement in either his mood or memory symptoms. There was an incident over the past month where he forgot that he had left  the stove on again. Mitchell [name] has noticed a headache, dry mouth, and increased tinnitus after starting the Prozac. The dry mouth and headache have been improving with time, while he is adapting to the tinnitus. We did review that dry mouth and headache are potential side effects from Prozac though tinnitus would not be typical. Patient is open to continuing on his current Prozac dose to see if side effects improve and if he has any further benefit as the drug level builds up.  Past Psychiatric History:  Patient denies any psychiatric care other than a psychiatrist at church once after his divorce who started on Zoloft for a month.  He denies prior suicide attempts or psychiatric hospitalizations.  Has been on Zoloft, Xanax and Ambien in the past.  Started Prozac and trazodone on 07/18/2022.  Hx of alcohol use been in remission for 1.5 years. Does endorse cravings. Used to smoke marijuana several decades ago. Denies any other substance use.   Past Medical History:  Past Medical History:  Diagnosis Date   Alcohol dependence in sustained full remission    Sober for past 1 year (05/02/22)   Benign essential hypertension 12/07/2020   Chest pain 06/01/2017   Chronic low back pain 03/11/2014   Constipation 03/06/2014   Coronary artery disease of native artery of native heart with stable angina pectoris 03/04/2014   a. 02/2014 Inf STEMI/PCI: LM nl, LAD min irregs, Diags 1/2/3 min irregs, LCX min irregs, OM1/2/3 min irregs, RI min irregs, RCA 100p (3.5x18 Xience DES), PDA/PL nl, EF 45-50% basal inf HK.   Dizziness, nonspecific 01/10/2021   Generalized anxiety disorder 03/04/2014   History of acute inferior wall MI 02/2014   Tx with DES to RCA   History of COVID-19 06/2018   Hyperlipidemia    Intolerance of drug    dyspnea with Brilinta >> changed to Effient    Ischemic cardiomyopathy    a. EF 45-50% with inf HK at Inf STEMI // b. Echo in 10/15 with EF 55-60% with inf and inf-lat HK   Major depressive  disorder    Mild cognitive impairment of uncertain or unknown etiology 05/02/2022   NSVT (nonsustained ventricular tachycardia)    a. in the setting of inferior STEMI;  b. 02/2014 Echo: EF 55-60%, Gr 1 DD, basal-midinferolateral/inf HK, Gr 1 DD, triv TR.   Statin intolerance 01/09/2021   Status post coronary artery stent placement    inferior STEMI 02/2014 with 100% RCA, (single vessel disease) stented with Xience DES, EF 55-60%.  Patent stent by cath 12/08/2020.   TIA (transient ischemic attack) 01/12/2017   Ventricular ectopy 12/07/2020    Past Surgical History:  Procedure Laterality Date   CARDIAC CATHETERIZATION     02/28/2014 prox RCA single vessel occlusion treated with DES   CORONARY ANGIOPLASTY WITH STENT PLACEMENT     LEFT HEART CATH Bilateral 02/28/2014   Procedure: LEFT HEART CATH;  Surgeon: Wellington Hampshire, MD;  Location: [facility];  Service: Cardiovascular;  Laterality: Bilateral;   TONSILLECTOMY     WRIST SURGERY      Family History:  Family History  Problem Relation Age of Onset   Parkinson's  disease Mother    Hypertension Father    Heart attack Brother 69   Dementia Maternal Grandmother    Dementia Paternal Grandmother     Social History:  Social History   Socioeconomic History   Marital status: Divorced    Spouse name: Not on file   Number of children: 2   Years of education: 9   Highest education level: 9th grade  Occupational History   Occupation: Disability    Comment: telephone lineman  Tobacco Use   Smoking status: Former   Smokeless tobacco: Never  Scientific laboratory technician Use: Never used  Substance and Sexual Activity   Alcohol use: Not Currently    Comment: hx of significant alcohol abuse; sober since 2022.   Drug use: No   Sexual activity: Not on file  Other Topics Concern   Not on file  Social History Narrative   ** Merged History Encounter **    Right handed   Drinks caffeine   Single story   Divorced 23 years, one dog   Social  Determinants of Health   Financial Resource Strain: Not on file  Food Insecurity: Not on file  Transportation Needs: Not on file  Physical Activity: Not on file  Stress: Not on file  Social Connections: Not on file    Allergies:  Allergies  Allergen Reactions   Statins Other (See Comments)    Current Medications: Current Outpatient Medications  Medication Sig Dispense Refill   amLODipine (NORVASC) 5 MG tablet Take 5 mg by mouth daily.     aspirin EC 81 MG tablet Take by mouth.     ezetimibe (ZETIA) 10 MG tablet Take 10 mg by mouth daily.     FLUoxetine (PROZAC) 10 MG capsule Take 1 capsule (10 mg total) by mouth daily for 7 days, THEN 2 capsules (20 mg total) daily for 23 days. 53 capsule 2   isosorbide mononitrate (IMDUR) 30 MG 24 hr tablet Take 0.5 tablets (15 mg total) by mouth daily. 45 tablet 3   metoprolol tartrate (LOPRESSOR) 25 MG tablet Take 25 mg by mouth 2 (two) times daily.     nitroGLYCERIN (NITROSTAT) 0.4 MG SL tablet Place 0.4 mg under the tongue every 5 (five) minutes as needed.     traZODone (DESYREL) 50 MG tablet Take 1-2 tablets (50-100 mg total) by mouth at bedtime. 60 tablet 1   No current facility-administered medications for this visit.     Psychiatric Specialty Exam: Review of Systems  There were no vitals taken for this visit.There is no height or weight on file to calculate BMI.  General Appearance: Well Groomed  Eye Contact:  Good  Speech:  Clear and Coherent  Volume:  Normal  Mood:  Depressed and Euthymic  Affect:  Congruent  Thought Process:  Coherent and Linear  Orientation:  Full (Time, Place, and Person)  Thought Content: Logical   Suicidal Thoughts:  No  Homicidal Thoughts:  No  Memory:  Immediate;   Fair  Judgement:  Good  Insight:  Fair  Psychomotor Activity:  Normal  Concentration:  Concentration: Fair  Recall:  Mitchell [name] of Knowledge: Fair  Language: Good  Akathisia:  NA    AIMS (if indicated): not done  Assets:   Communication Skills Housing  ADL's:  Intact  Cognition: WNL  Sleep:  Fair   Metabolic Disorder Labs: Lab Results  Component Value Date   HGBA1C 5.0 06/01/2017   MPG 96.8 06/01/2017   MPG 105 03/11/2014  No results found for: "PROLACTIN" Lab Results  Component Value Date   CHOL 159 06/02/2017   TRIG 217 (H) 06/02/2017   HDL 41 06/02/2017   CHOLHDL 3.9 06/02/2017   VLDL 43 (H) 06/02/2017   LDLCALC 75 06/02/2017   LDLCALC 63 03/08/2015   Lab Results  Component Value Date   TSH 0.58 04/06/2022   TSH 1.429 06/01/2017    Therapeutic Level Labs: No results found for: "LITHIUM" No results found for: "VALPROATE" No results found for: "CBMZ"   Screenings: GAD-7    Flowsheet Row Office Visit from 07/18/2022 in [facility]  Total GAD-7 Score 14      PHQ2-9    [place] Office Visit from 07/18/2022 in [facility]  PHQ-2 Total Score 2  PHQ-9 Total Score 12      [place] Office Visit from 07/18/2022 in [place] No Risk       Collaboration of Care: Collaboration of Care: Medication Management AEB medication prescription and Other provider involved in patient's care [place] cardiology chart review  Patient/Guardian was advised Release of Information must be obtained prior to any record release in order to collaborate their care with an outside provider. Patient/Guardian was advised if they have not already done so to contact the registration department to sign all necessary forms in order for [place] to release information regarding their care.   Consent: Patient/Guardian gives verbal consent for treatment and assignment of benefits for services provided during this visit. Patient/Guardian expressed understanding and agreed to proceed.    Vista Mink, MD 08/17/2022, 7:51 AM   Virtual Visit via Video Note  I connected  with Darleen Crocker on 08/17/22 at  8:00 AM EDT by a video enabled telemedicine application and verified that I am speaking with the correct person using two identifiers.  Location: Patient: Home Provider: Home Office   I discussed the limitations of evaluation and management by telemedicine and the availability of in person appointments. The patient expressed understanding and agreed to proceed.   I discussed the assessment and treatment plan with the patient. The patient was provided an opportunity to ask questions and all were answered. The patient agreed with the plan and demonstrated an understanding of the instructions.   The patient was advised to call back or seek an in-person evaluation if the symptoms worsen or if the condition fails to improve as anticipated.  I provided 15 minutes of non-face-to-face time during this encounter.   Vista Mink, MD

## 2022-09-28 ENCOUNTER — Telehealth (HOSPITAL_BASED_OUTPATIENT_CLINIC_OR_DEPARTMENT_OTHER): Payer: Medicare (Managed Care) | Admitting: Psychiatry

## 2022-09-28 ENCOUNTER — Encounter (HOSPITAL_COMMUNITY): Payer: Self-pay | Admitting: Psychiatry

## 2022-09-28 DIAGNOSIS — F331 Major depressive disorder, recurrent, moderate: Secondary | ICD-10-CM | POA: Diagnosis not present

## 2022-09-28 DIAGNOSIS — G3184 Mild cognitive impairment, so stated: Secondary | ICD-10-CM

## 2022-09-28 DIAGNOSIS — F411 Generalized anxiety disorder: Secondary | ICD-10-CM | POA: Diagnosis not present

## 2022-09-28 MED ORDER — VENLAFAXINE HCL ER 37.5 MG PO CP24: 37.5000 mg | ORAL_CAPSULE | Freq: Every day | ORAL | 2 refills | Status: DC

## 2022-09-28 MED ORDER — FLUOXETINE HCL 10 MG PO CAPS: 10.0000 mg | ORAL_CAPSULE | Freq: Every day | ORAL | 2 refills | Status: DC

## 2022-09-28 NOTE — Progress Notes (Signed)
BH MD/PA/NP OP Progress Note  09/28/2022 8:30 AM Mitchell [name]  MRN:  [id number]  Visit Diagnosis:    ICD-10-CM   1. Moderate episode of recurrent major depressive disorder (HCC)  F33.1     2. Generalized anxiety disorder  F41.1     3. Mild cognitive impairment of uncertain or unknown etiology  G31.84       Assessment: Mitchell [name] is a 62 y.o. 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  During initial evaluation patient reported neurovegetative symptoms of depression including low mood, insomnia, worthlessness, negative self thoughts, fatigue, difficulty concentrating, and passive SI without intent or plan.  He also endorsed significant symptoms of anxiety including constant worry that he is unable to control, difficulty relaxing, restlessness, and increased irritability.  Of note patient had been having memory issues such as forgetting to turn off the stove or water.  Neuropsych testing was performed which showed mild cognitive impairment of unclear etiology, with depression/anxiety being likely causes.  Neuro workup was negative on EEG and MRI showed only minimal progression in small vessel ischemic changes that is less likely to be consistent with the severity and dropping his cognition/memory. Psychosocially patient is struggling with increased isolation and financial stressors.  He met criteria for MDD and generalized anxiety disorder.  Mitchell [name] presents for follow-up evaluation. Today, 09/28/22, patient reports continued depressed mood with some increased anhedonia.  Patient's isolation and worsening memory seem to be factors  relating to this.  He has been taking the trazodone with good benefit for sleep.  Prozac however has had limited effect and patient still endorses experiencing tendinitis.  We will start to taper off Prozac and start Effexor today.  Risk and benefits were discussed.  Plan: - Taper Prozac to 10 mg QD - Start Effexor 37.5 mg QD - Continue Trazodone 50-100 mg QHS prn for insomnia - Neuropsych testing from 05/02/22 reviewed, recommend repeat in 18-24 months - Recommend referral to sleep specialist - MRI brain from 05/11/22 reviewed, showing mild chronic small vessel ischemic changes minimally progressed from the brain MRI in August 2018 - EEG WNL - Crisis resources reviewed - Follow up in 4 weeks    Chief Complaint:  Chief Complaint  Patient presents with   Follow-up   HPI: Mitchell [name] presents today reporting that things are about the same over the past month.  He has not noticed any change in his memory symptoms and can still forget to close the fridge or put coffee grounds in the coffeepot.  In regards to his mood he describes feeling a bit more anhedonic about things for instance cleaning the house or doing the dishes.  In the past patient would keep in the household however now he is feeling like there is not much of a point.  He has been getting back into Bible study which she enjoyed and was able to go to church a couple weeks ago which she was happy about.  Unfortunately due to the tight nature of his finances he has not been able to consistently get out of the house.  This sense of isolation has been difficult for [name] along with the limited social interactions he has.  On review  of medication he has taken trazodone consistently which has been helping with his sleep.  He now sleeps around 6 to 7 hours a night before waking up which is a marked improvement.  He can have trouble falling back asleep after waking up and will occasionally take a 1 to 2-hour nap later in the day.  As for the Prozac  he is unsure if it is having much benefit and notes that he still does experience tinnitus.  We discussed options and decided to taper off Prozac and start venlafaxine for management of mood symptoms.  Risks and benefits of venlafaxine were discussed.  Past Psychiatric History:  Patient denies any psychiatric care other than a psychiatrist at church once after his divorce who started on Zoloft for a month.  He denies prior suicide attempts or psychiatric hospitalizations.  Has been on Zoloft, Xanax and Ambien in the past.  Started Prozac and trazodone on 07/18/2022.  Hx of alcohol use been in remission for 1.5 years. Does endorse cravings. Used to smoke marijuana several decades ago. Denies any other substance use.   Past Medical History:  Past Medical History:  Diagnosis Date   Alcohol dependence in sustained full remission    Sober for past 1 year (05/02/22)   Benign essential hypertension 12/07/2020   Chest pain 06/01/2017   Chronic low back pain 03/11/2014   Constipation 03/06/2014   Coronary artery disease of native artery of native heart with stable angina pectoris 03/04/2014   a. 02/2014 Inf STEMI/PCI: LM nl, LAD min irregs, Diags 1/2/3 min irregs, LCX min irregs, OM1/2/3 min irregs, RI min irregs, RCA 100p (3.5x18 Xience DES), PDA/PL nl, EF 45-50% basal inf HK.   Dizziness, nonspecific 01/10/2021   Generalized anxiety disorder 03/04/2014   History of acute inferior wall MI 02/2014   Tx with DES to RCA   History of COVID-19 06/2018   Hyperlipidemia    Intolerance of drug    dyspnea with Brilinta >> changed to Effient    Ischemic cardiomyopathy    a. EF 45-50% with inf HK at Inf STEMI // b. Echo in 10/15 with EF 55-60% with inf and inf-lat HK   Major depressive disorder    Mild cognitive impairment of uncertain or unknown etiology 05/02/2022   NSVT (nonsustained ventricular tachycardia)    a. in the setting of inferior STEMI;  b. 02/2014 Echo: EF 55-60%, Gr 1 DD,  basal-midinferolateral/inf HK, Gr 1 DD, triv TR.   Statin intolerance 01/09/2021   Status post coronary artery stent placement    inferior STEMI 02/2014 with 100% RCA, (single vessel disease) stented with Xience DES, EF 55-60%.  Patent stent by cath 12/08/2020.   TIA (transient ischemic attack) 01/12/2017   Ventricular ectopy 12/07/2020    Past Surgical History:  Procedure Laterality Date   CARDIAC CATHETERIZATION     02/28/2014 prox RCA single vessel occlusion treated with DES   CORONARY ANGIOPLASTY WITH STENT PLACEMENT     LEFT HEART CATH Bilateral 02/28/2014   Procedure: LEFT HEART CATH;  Surgeon: Iran Ouch, MD;  Location: [facility] CATH LAB;  Service: Cardiovascular;  Laterality: Bilateral;   TONSILLECTOMY     WRIST SURGERY      Family History:  Family History  Problem Relation Age of Onset   Parkinson's disease Mother    Hypertension Father    Heart attack Brother 76   Dementia Maternal Grandmother    Dementia Paternal Grandmother     Social History:  Social History  Socioeconomic History   Marital status: Divorced    Spouse name: Not on file   Number of children: 2   Years of education: 9   Highest education level: 9th grade  Occupational History   Occupation: Disability    Comment: telephone lineman  Tobacco Use   Smoking status: Former   Smokeless tobacco: Never  Building services engineer Use: Never used  Substance and Sexual Activity   Alcohol use: Not Currently    Comment: hx of significant alcohol abuse; sober since 2022.   Drug use: No   Sexual activity: Not on file  Other Topics Concern   Not on file  Social History Narrative   ** Merged History Encounter **    Right handed   Drinks caffeine   Single story   Divorced 23 years, one dog   Social Determinants of Health   Financial Resource Strain: Not on file  Food Insecurity: Not on file  Transportation Needs: Not on file  Physical Activity: Not on file  Stress: Not on file  Social Connections:  Not on file    Allergies:  Allergies  Allergen Reactions   Statins Other (See Comments)    Current Medications: Current Outpatient Medications  Medication Sig Dispense Refill   amLODipine (NORVASC) 5 MG tablet Take 5 mg by mouth daily.     aspirin EC 81 MG tablet Take by mouth.     ezetimibe (ZETIA) 10 MG tablet Take 10 mg by mouth daily.     FLUoxetine (PROZAC) 20 MG capsule Take 1 capsule (20 mg total) by mouth daily. 30 capsule 2   isosorbide mononitrate (IMDUR) 30 MG 24 hr tablet Take 0.5 tablets (15 mg total) by mouth daily. 45 tablet 3   metoprolol tartrate (LOPRESSOR) 25 MG tablet Take 25 mg by mouth 2 (two) times daily.     nitroGLYCERIN (NITROSTAT) 0.4 MG SL tablet Place 0.4 mg under the tongue every 5 (five) minutes as needed.     traZODone (DESYREL) 50 MG tablet Take 1.5-2 tablets (75-100 mg total) by mouth at bedtime. 60 tablet 2   No current facility-administered medications for this visit.     Psychiatric Specialty Exam: Review of Systems  There were no vitals taken for this visit.There is no height or weight on file to calculate BMI.  General Appearance: Well Groomed  Eye Contact:  Good  Speech:  Clear and Coherent  Volume:  Normal  Mood:  Depressed and Euthymic  Affect:  Congruent  Thought Process:  Coherent and Linear  Orientation:  Full (Time, Place, and Person)  Thought Content: Logical   Suicidal Thoughts:  No  Homicidal Thoughts:  No  Memory:  Immediate;   Fair  Judgement:  Good  Insight:  Fair  Psychomotor Activity:  Normal  Concentration:  Concentration: Fair  Recall:  Fair  Fund of Knowledge: Fair  Language: Good  Akathisia:  NA    AIMS (if indicated): not done  Assets:  Communication Skills Housing  ADL's:  Intact  Cognition: WNL  Sleep:  Fair   Metabolic Disorder Labs: Lab Results  Component Value Date   HGBA1C 5.0 06/01/2017   MPG 96.8 06/01/2017   MPG 105 03/11/2014   No results found for: "PROLACTIN" Lab Results  Component  Value Date   CHOL 159 06/02/2017   TRIG 217 (H) 06/02/2017   HDL 41 06/02/2017   CHOLHDL 3.9 06/02/2017   VLDL 43 (H) 06/02/2017   LDLCALC 75 06/02/2017   LDLCALC 63  03/08/2015   Lab Results  Component Value Date   TSH 0.58 04/06/2022   TSH 1.429 06/01/2017    Therapeutic Level Labs: No results found for: "LITHIUM" No results found for: "VALPROATE" No results found for: "CBMZ"   Screenings: GAD-7    Flowsheet Row Office Visit [facility]/18/2022 in [facility]  Total GAD-7 Score 14      PHQ2-9    Flowsheet Row Office Visit from 07/18/2022 in [facility]  PHQ-2 Total Score 2  PHQ-9 Total Score 12      Flowsheet Row Office Visit from 07/18/2022 in [facility]  C-SSRS RISK CATEGORY No Risk       Collaboration of Care: Collaboration of Care: Medication Management AEB medication prescription and Other provider involved in patient's care AEB cardiology chart review  Patient/Guardian was advised Release of Information must be obtained prior to any record release in order to collaborate their care with an outside provider. Patient/Guardian was advised if they have not already done so to contact the registration department to sign all necessary forms in order for [place] to release information regarding their care.   Consent: Patient/Guardian gives verbal consent for treatment and assignment of benefits for services provided during this visit. Patient/Guardian expressed understanding and agreed to proceed.    Stasia Cavalier, MD 09/28/2022, 8:30 AM   Virtual Visit via Video Note  I connected with Mitchell [name] on 09/28/22 at  8:30 AM EDT by a video enabled telemedicine application and verified that I am speaking with the correct person using two identifiers.  Location: Patient: Home Provider: Home Office   I discussed the limitations of evaluation and  management by telemedicine and the availability of in person appointments. The patient expressed understanding and agreed to proceed.   I discussed the assessment and treatment plan with the patient. The patient was provided an opportunity to ask questions and all were answered. The patient agreed with the plan and demonstrated an understanding of the instructions.   The patient was advised to call back or seek an in-person evaluation if the symptoms worsen or if the condition fails to improve as anticipated.  I provided 30 minutes of non-face-to-face time during this encounter.   Stasia Cavalier, MD

## 2022-10-26 ENCOUNTER — Encounter (HOSPITAL_COMMUNITY): Payer: Self-pay | Admitting: Psychiatry

## 2022-10-26 ENCOUNTER — Telehealth (HOSPITAL_BASED_OUTPATIENT_CLINIC_OR_DEPARTMENT_OTHER): Payer: Medicare (Managed Care) | Admitting: Psychiatry

## 2022-10-26 DIAGNOSIS — F411 Generalized anxiety disorder: Secondary | ICD-10-CM

## 2022-10-26 DIAGNOSIS — G3184 Mild cognitive impairment, so stated: Secondary | ICD-10-CM | POA: Diagnosis not present

## 2022-10-26 DIAGNOSIS — F331 Major depressive disorder, recurrent, moderate: Secondary | ICD-10-CM | POA: Diagnosis not present

## 2022-10-26 MED ORDER — TRAZODONE HCL 50 MG PO TABS
75.0000 mg | ORAL_TABLET | Freq: Every day | ORAL | 2 refills | Status: DC
Start: 2022-10-26 — End: 2023-03-09

## 2022-10-26 MED ORDER — THIAMINE MONONITRATE 100 MG PO TABS
100.0000 mg | ORAL_TABLET | Freq: Every day | ORAL | 2 refills | Status: DC
Start: 2022-10-26 — End: 2023-03-09

## 2022-10-26 NOTE — Progress Notes (Signed)
BH MD/PA/NP OP Progress Note  10/26/2022 12:58 PM Mitchell [name]  MRN:  [id number]  Visit Diagnosis:    ICD-10-CM   1. Mild cognitive impairment of uncertain or unknown etiology  G31.84 thiamine (VITAMIN B1) 100 MG tablet    2. Generalized anxiety disorder  F41.1 traZODone (DESYREL) 50 MG tablet    3. Moderate episode of recurrent major depressive disorder (HCC)  F33.1 traZODone (DESYREL) 50 MG tablet       Assessment: Mitchell [name] is a 62 y.o. 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  During initial evaluation patient reported neurovegetative symptoms of depression including low mood, insomnia, worthlessness, negative self thoughts, fatigue, difficulty concentrating, and passive SI without intent or plan.  He also endorsed significant symptoms of anxiety including constant worry that he is unable to control, difficulty relaxing, restlessness, and increased irritability.  Of note patient had been having memory issues such as forgetting to turn off the stove or water.  Neuropsych testing was performed which showed mild cognitive impairment of unclear etiology, with depression/anxiety being likely causes.  Neuro workup was negative on EEG and MRI showed only minimal progression in small vessel ischemic changes that is less likely to be consistent with the severity and dropping his cognition/memory. Psychosocially patient is struggling with increased isolation and financial stressors.  He met criteria for MDD and generalized anxiety disorder.  Mitchell [name] presents for follow-up evaluation. Today, 10/26/22, patient reports an improvement in  energy and motivation over the past month.  He attributes this to the Effexor and denied side effects other than headache after starting initially which has since resolved.  Patient has had some increase in anxiety this past month though believes this is secondary to focusing on the news and politics.  Has been no significant improvement in memory with the initiation of Effexor.  Patient did discontinue Prozac rather than taper due to concerns about cost.  We will continue on Effexor and trazodone at this time, and suggested starting on thiamine if it is affordable.  Patient will follow up in 6 to 8 weeks.  Plan: - Continue Effexor XR 37.5 mg QD - Continue Trazodone 50-100 mg QHS prn for insomnia - Start thiamine 100 mg QD if affordable - Discontinue Prozac - Neuropsych testing from 05/02/22 reviewed, recommend repeat in 18-24 months - Recommend referral to sleep specialist - MRI brain from 05/11/22 reviewed, showing mild chronic small vessel ischemic changes minimally progressed from the brain MRI in August 2018 - EEG WNL - Crisis resources reviewed - Follow up in 6-8 weeks    Chief Complaint:  Chief Complaint  Patient presents with   Follow-up   HPI: Mitchell [name] presents today reporting that he is feeling "fairly midlding". Since starting the Effexor he has has noticed a bit more pep in his step this past month. Mitchell [name] has been keeping up with his chores better and they are probably more active.  Overall he does report he is doing less depressed and has been focusing on letting go of his past animosities.  There are times where he feels like he cannot sit still and pacing for a bit.  We explored concern for potential increased anxiety which patient did  think was the case though he is unsure whether that is related to the medicine.  He notes that he has been focusing a lot on the news and political election currently and is concerned about how the outcome may affect his kids.  Outside of potential  increase in anxiety patient reports the only other negative medication was developing a headache after starting initially.  This has resolved in the past week or so though.  Patient notes that he did discontinue Prozac and due to financial concerns of paying for both medications at the same time.  He denies any adverse side effects from discontinuing the medication.  Unfortunately Mitchell [name] reports no improvement in his memory symptoms with the initiation of Effexor.  We did review the memory symptoms along with his former alcohol use.  Based on his history of significant past alcohol use we suggested trying thiamine.  Patient was open to this and notes that it has been suggested in the past however he was unable to afford the medication.  We will set up a prescription to see if it is covered by insurance and he will look into if he has any extra funds next month.  The financial situation is a concern that we discussed with the patient as some months he is unable to afford food for a few days.  Patient does report some weight loss secondary to this.  Overall though he reports not being overly concerned by the lack of food for a few days a month.  We discussed resources patient notes that he is already receiving EBT however is not going as far with recent inflation.  Patient also mentioned an episode of chest pain yesterday that improved after taking IMDUR. Based on his description it appears to be an episode of angina.  We recommended the patient reach out to his cardiologist to inform him of the episode.  Past Psychiatric History:  Patient denies any psychiatric care other than a psychiatrist at church once after his divorce who started on Zoloft for a month.  He denies prior suicide attempts or psychiatric hospitalizations.  Has been on Zoloft, Xanax and Ambien in the past.  Started Prozac and trazodone on 07/18/2022.  Hx of alcohol use been in remission for 1.5 years. Does endorse cravings. Used to smoke  marijuana several decades ago. Denies any other substance use.   Past Medical History:  Past Medical History:  Diagnosis Date   Alcohol dependence in sustained full remission    Sober for past 1 year (05/02/22)   Benign essential hypertension 12/07/2020   Chest pain 06/01/2017   Chronic low back pain 03/11/2014   Constipation 03/06/2014   Coronary artery disease of native artery of native heart with stable angina pectoris 03/04/2014   a. 02/2014 Inf STEMI/PCI: LM nl, LAD min irregs, Diags 1/2/3 min irregs, LCX min irregs, OM1/2/3 min irregs, RI min irregs, RCA 100p (3.5x18 Xience DES), PDA/PL nl, EF 45-50% basal inf HK.   Dizziness, nonspecific 01/10/2021   Generalized anxiety disorder 03/04/2014   History of acute inferior wall MI 02/2014   Tx with DES to RCA   History of COVID-19 06/2018   Hyperlipidemia    Intolerance of drug    dyspnea with Brilinta >> changed to Effient    Ischemic cardiomyopathy    a. EF 45-50% with inf HK at Inf STEMI // b. Echo in 10/15 with EF 55-60% with inf and inf-lat HK   Major depressive disorder    Mild cognitive impairment  of uncertain or unknown etiology 05/02/2022   NSVT (nonsustained ventricular tachycardia)    a. in the setting of inferior STEMI;  b. 02/2014 Echo: EF 55-60%, Gr 1 DD, basal-midinferolateral/inf HK, Gr 1 DD, triv TR.   Statin intolerance 01/09/2021   Status post coronary artery stent placement    inferior STEMI 02/2014 with 100% RCA, (single vessel disease) stented with Xience DES, EF 55-60%.  Patent stent by cath 12/08/2020.   TIA (transient ischemic attack) 01/12/2017   Ventricular ectopy 12/07/2020    Past Surgical History:  Procedure Laterality Date   CARDIAC CATHETERIZATION     02/28/2014 prox RCA single vessel occlusion treated with DES   CORONARY ANGIOPLASTY WITH STENT PLACEMENT     LEFT HEART CATH Bilateral 02/28/2014   Procedure: LEFT HEART CATH;  Surgeon: Iran Ouch, MD;  Location: [facility] CATH LAB;  Service:  Cardiovascular;  Laterality: Bilateral;   TONSILLECTOMY     WRIST SURGERY      Family History:  Family History  Problem Relation Age of Onset   Parkinson's disease Mother    Hypertension Father    Heart attack Brother 36   Dementia Maternal Grandmother    Dementia Paternal Grandmother     Social History:  Social History   Socioeconomic History   Marital status: Divorced    Spouse name: Not on file   Number of children: 2   Years of education: 9   Highest education level: 9th grade  Occupational History   Occupation: Disability    Comment: telephone lineman  Tobacco Use   Smoking status: Former   Smokeless tobacco: Never  Building services engineer Use: Never used  Substance and Sexual Activity   Alcohol use: Not Currently    Comment: hx of significant alcohol abuse; sober since 2022.   Drug use: No   Sexual activity: Not on file  Other Topics Concern   Not on file  Social History Narrative   ** Merged History Encounter **    Right handed   Drinks caffeine   Single story   Divorced 23 years, one dog   Social Determinants of Health   Financial Resource Strain: Not on file  Food Insecurity: Not on file  Transportation Needs: Not on file  Physical Activity: Not on file  Stress: Not on file  Social Connections: Not on file    Allergies:  Allergies  Allergen Reactions   Statins Other (See Comments)    Current Medications: Current Outpatient Medications  Medication Sig Dispense Refill   thiamine (VITAMIN B1) 100 MG tablet Take 1 tablet (100 mg total) by mouth daily. 30 tablet 2   amLODipine (NORVASC) 5 MG tablet Take 5 mg by mouth daily.     aspirin EC 81 MG tablet Take by mouth.     ezetimibe (ZETIA) 10 MG tablet Take 10 mg by mouth daily.     isosorbide mononitrate (IMDUR) 30 MG 24 hr tablet Take 0.5 tablets (15 mg total) by mouth daily. 45 tablet 3   metoprolol tartrate (LOPRESSOR) 25 MG tablet Take 25 mg by mouth 2 (two) times daily.     nitroGLYCERIN  (NITROSTAT) 0.4 MG SL tablet Place 0.4 mg under the tongue every 5 (five) minutes as needed.     traZODone (DESYREL) 50 MG tablet Take 1.5-2 tablets (75-100 mg total) by mouth at bedtime. 60 tablet 2   venlafaxine XR (EFFEXOR XR) 37.5 MG 24 hr capsule Take 1 capsule (37.5 mg total) by mouth daily. 30  capsule 2   No current facility-administered medications for this visit.     Psychiatric Specialty Exam: Review of Systems  There were no vitals taken for this visit.There is no height or weight on file to calculate BMI.  General Appearance: Well Groomed  Eye Contact:  Good  Speech:  Clear and Coherent  Volume:  Normal  Mood:  Anxious and Euthymic  Affect:  Congruent  Thought Process:  Coherent and Linear  Orientation:  Full (Time, Place, and Person)  Thought Content: Logical   Suicidal Thoughts:  No  Homicidal Thoughts:  No  Memory:  Immediate;   Fair  Judgement:  Good  Insight:  Fair  Psychomotor Activity:  Normal  Concentration:  Concentration: Fair  Recall:  Fair  Fund of Knowledge: Fair  Language: Good  Akathisia:  NA    AIMS (if indicated): not done  Assets:  Communication Skills Housing  ADL's:  Intact  Cognition: WNL  Sleep:  Fair   Metabolic Disorder Labs: Lab Results  Component Value Date   HGBA1C 5.0 06/01/2017   MPG 96.8 06/01/2017   MPG 105 03/11/2014   No results found for: "PROLACTIN" Lab Results  Component Value Date   CHOL 159 06/02/2017   TRIG 217 (H) 06/02/2017   HDL 41 06/02/2017   CHOLHDL 3.9 06/02/2017   VLDL 43 (H) 06/02/2017   LDLCALC 75 06/02/2017   LDLCALC 63 03/08/2015   Lab Results  Component Value Date   TSH 0.58 04/06/2022   TSH 1.429 06/01/2017    Therapeutic Level Labs: No results found for: "LITHIUM" No results found for: "VALPROATE" No results found for: "CBMZ"   Screenings: GAD-7    Flowsheet Row Office Visit [facility]18/20[facility] in [facility]  Total GAD-7 Score 14       PHQ2-9    Flowsheet Row Office Visit from 07/18/2022 in [facility]  PHQ-2 Total Score 2  PHQ-9 Total Score 12      Flowsheet Row Office Visit from 07/18/2022 in [facility]  C-SSRS RISK CATEGORY No Risk       Collaboration of Care: Collaboration of Care: Medication Management AEB medication prescription and Other provider involved in patient's care AEB cardiology chart review  Patient/Guardian was advised Release of Information must be obtained prior to any record release in order to collaborate their care with an outside provider. Patient/Guardian was advised if they have not already done so to contact the registration department to sign all necessary forms in order for [place] to release information regarding their care.   Consent: Patient/Guardian gives verbal consent for treatment and assignment of benefits for services provided during this visit. Patient/Guardian expressed understanding and agreed to proceed.    Stasia Cavalier, MD 10/26/2022, 12:58 PM   Virtual Visit via Video Note  I connected with [name] on 10/26/22 at 10:00 AM EDT by a video enabled telemedicine application and verified that I am speaking with the correct person using two identifiers.  Location: Patient: Home Provider: Home Office   I discussed the limitations of evaluation and management by telemedicine and the availability of in person appointments. The patient expressed understanding and agreed to proceed.   I discussed the assessment and treatment plan with the patient. The patient was provided an opportunity to ask questions and all were answered. The patient agreed with the plan and demonstrated an understanding of the instructions.   The patient was advised to call back or seek  an in-person evaluation if the symptoms worsen or if the condition fails to improve as anticipated.  I provided 35 minutes of  non-face-to-face time during this encounter.   Stasia Cavalier, MD

## 2022-10-27 ENCOUNTER — Telehealth: Payer: Self-pay | Admitting: Cardiovascular Disease

## 2022-10-27 NOTE — Telephone Encounter (Signed)
Reports "pretty bad angina attack" Wednesday night.   Reminded him of when he had a heart attack- not as bad.  Pain in left chest that went into upper left arm.  Has felt before but only about 30-60 seconds.  Waited about 15 min and then took extra dose (15 mg) isosorbide.  After 30-45 min pain eased off.    Occurred sitting in bed.  Had no food and only water that day.  Starting yesterday both feet, ankles are swollen L>R, leaves indention when you press in.  Can only put sandals on, no tighter shoes.   Salts foods, no unusually salty meals.    Swelling has occurred for years but would improve after elevating but this has not really improved.    No pain in left leg, no areas of redness or warmth.    I asked him to elevate legs and minimize sodium.  Call back on Monday if swelling worsens.  Call EMS if chest pain symptoms return and are not relieved or are worse than previous.  He has all meds except nitroglycerin - he will get that this afternoon.     Adv Dr. Clifton James is out of office today and off next week.  Adv he may wish to increase isosorbide to whole tablet and that we will call patient back with recommendations.

## 2022-10-27 NOTE — Telephone Encounter (Signed)
Pt c/o swelling: STAT is pt has developed SOB within 24 hours  If swelling, where is the swelling located? feet are swollen, the left one is larger  How much weight have you gained and in what time span?  no  Have you gained 3 pounds in a day or 5 pounds in a week?   Do you have a log of your daily weights (if so, list)? no  Are you currently taking a fluid pill? Not sure if he takes a fluid pill  Are you currently SOB? no  Have you traveled recently? no

## 2022-10-30 NOTE — Telephone Encounter (Signed)
Called patient. Swelling has gone back done a lot.  Still present on left but much improved. Right is normal.  Overall much better.   He is unable to come in due to not having gas to drive here.   He would be willing to do a video visit but feels it will likely not be helpful.   He asked for an appointment in Aug.  He is also seeing his PCP in the meantime.   Has not picked up ntg yet.

## 2022-12-13 NOTE — Progress Notes (Unsigned)
BH MD/PA/NP OP Progress Note  12/14/2022 9:30 AM [name]  MRN:  [id number]  Visit Diagnosis:    ICD-10-CM   1. Mild cognitive impairment of uncertain or unknown etiology  G31.84     2. Generalized anxiety disorder  F41.1     3. Moderate episode of recurrent major depressive disorder (HCC)  F33.1         Assessment: Mitchell [name] is a 62 y.o. male with a history of hypertension, hyperlipidemia, TIA 2018 (with L arm numbness blurred vision with neg MRA/MRI except for minimal SVD), "heat stroke" in 2012, CAD s/p MI, ICM, anxiety, depression, prior alcohol abuse in sustained remission, strong family history of Alzheimer's diseasewho presented to [facility] for initial evaluation on 07/18/2022.  During initial evaluation patient reported neurovegetative symptoms of depression including low mood, insomnia, worthlessness, negative self thoughts, fatigue, difficulty concentrating, and passive SI without intent or plan.  He also endorsed significant symptoms of anxiety including constant worry that he is unable to control, difficulty relaxing, restlessness, and increased irritability.  Of note patient had been having memory issues such as forgetting to turn off the stove or water.  Neuropsych testing was performed which showed mild cognitive impairment of unclear etiology, with depression/anxiety being possible causes.  Neuro workup was negative on EEG and MRI showed only minimal progression in small vessel ischemic changes that is less likely to be consistent with the severity and dropping his cognition/memory. Psychosocially patient is struggling with increased isolation and financial stressors.  He met criteria for MDD and generalized anxiety disorder.  Mitchell [name] presents for follow-up evaluation. Today, 12/14/22, patient reports that his mood has improved over the last couple months.  Of note he did have to discontinue the Effexor and trazodone  due to financial stressors and being unable to afford the medication.  Patient had been experiencing some increased irritability and headaches which improved after he stopped the medication.  While usual but the also visibly after taking medication for several months it is likely that they are related to the Effexor.  Patient also had a decline in sleep after stopping the trazodone to help with his improved after a week and a half.  We recommend discontinuing the Effexor going forward and restarting trazodone as needed once he is able to afford medication again. As patients mood has improved and memory issues remain, it is less likely that his memory issues are secondary to mood symptoms.  We will follow-up in 3 months  Plan: - Discontinue Effexor XR 37.5 mg QD - Continue Trazodone 50-100 mg QHS prn for insomnia - Start thiamine 100 mg QD if affordable - Discontinue Prozac - Neuropsych testing from 05/02/22 reviewed, recommend repeat in 18-24 months - Recommend referral to sleep specialist - MRI brain from 05/11/22 reviewed, showing mild chronic small vessel ischemic changes minimally progressed from the brain MRI in August 2018 - EEG WNL - Crisis resources reviewed - Follow up in 3 months  Chief Complaint:  Chief Complaint  Patient presents with   Follow-up   HPI: [name] presents today reporting that he has been ok, working on his spiritual health. He has been releasing a lot of the things he had pent up. Working on Chiropractor the relationships with his sons and brother. Studying the bible has really helped a lot. He has been off a several of his medications since July 4th due to not being able to afford them. Mitchell [name] had some repairs for  the house that needed to be done. He has only been taking the blood pressure medication and his nitrostat which he had leftovers over. The Effexor he had felt helped him for a while, but found that he was getting more irritable over time. He was also starting to get  headaches likely second to that. His sleep was a bit rough at first without the trazodone, but seems to have gotten better now. He is sleeping around 6 hours, waking up shortly and then sleeping for a couple more hours. Despite stopping the medicine he still noticed no improvement in his memory. He has continued to forget about the stove being on and other similar events.   We discussed the plan going forward and agreed that we could hold off on restarting the Effexor for now since his mood has been improving and because he was experiencing adverse side effects from the medication. As for the trazodone we suggested picking it up for an as needed use in the future.   Past Psychiatric History:  Patient denies any psychiatric care other than a psychiatrist at church once after his divorce who started on Zoloft for a month.  He denies prior suicide attempts or psychiatric hospitalizations.  Has been on Zoloft, Prozac (fatigue), Effexor (headaches and irritability), Xanax and Ambien in the past.  Started  trazodone on 07/18/2022.  Hx of alcohol use been in remission for 2 years. Does endorse cravings. Used to smoke marijuana several decades ago. Denies any other substance use.   Past Medical History:  Past Medical History:  Diagnosis Date   Alcohol dependence in sustained full remission    Sober for past 1 year (05/02/22)   Benign essential hypertension 12/07/2020   Chest pain 06/01/2017   Chronic low back pain 03/11/2014   Constipation 03/06/2014   Coronary artery disease of native artery of native heart with stable angina pectoris 03/04/2014   a. 02/2014 Inf STEMI/PCI: LM nl, LAD min irregs, Diags 1/2/3 min irregs, LCX min irregs, OM1/2/3 min irregs, RI min irregs, RCA 100p (3.5x18 Xience DES), PDA/PL nl, EF 45-50% basal inf HK.   Dizziness, nonspecific 01/10/2021   Generalized anxiety disorder 03/04/2014   History of acute inferior wall MI 02/2014   Tx with DES to RCA   History of COVID-19  06/2018   Hyperlipidemia    Intolerance of drug    dyspnea with Brilinta >> changed to Effient    Ischemic cardiomyopathy    a. EF 45-50% with inf HK at Inf STEMI // b. Echo in 10/15 with EF 55-60% with inf and inf-lat HK   Major depressive disorder    Mild cognitive impairment of uncertain or unknown etiology 05/02/2022   NSVT (nonsustained ventricular tachycardia)    a. in the setting of inferior STEMI;  b. 02/2014 Echo: EF 55-60%, Gr 1 DD, basal-midinferolateral/inf HK, Gr 1 DD, triv TR.   Statin intolerance 01/09/2021   Status post coronary artery stent placement    inferior STEMI 02/2014 with 100% RCA, (single vessel disease) stented with Xience DES, EF 55-60%.  Patent stent by cath 12/08/2020.   TIA (transient ischemic attack) 01/12/2017   Ventricular ectopy 12/07/2020    Past Surgical History:  Procedure Laterality Date   CARDIAC CATHETERIZATION     02/28/2014 prox RCA single vessel occlusion treated with DES   CORONARY ANGIOPLASTY WITH STENT PLACEMENT     LEFT HEART CATH Bilateral 02/28/2014   Procedure: LEFT HEART CATH;  Surgeon: Iran Ouch, MD;  Location: [facility]  CATH LAB;  Service: Cardiovascular;  Laterality: Bilateral;   TONSILLECTOMY     WRIST SURGERY      Family History:  Family History  Problem Relation Age of Onset   Parkinson's disease Mother    Hypertension Father    Heart attack Brother 77   Dementia Maternal Grandmother    Dementia Paternal Grandmother     Social History:  Social History   Socioeconomic History   Marital status: Divorced    Spouse name: Not on file   Number of children: 2   Years of education: 9   Highest education level: 9th grade  Occupational History   Occupation: Disability    Comment: telephone lineman  Tobacco Use   Smoking status: Former   Smokeless tobacco: Never  Advertising account planner   Vaping status: Never Used  Substance and Sexual Activity   Alcohol use: Not Currently    Comment: hx of significant alcohol abuse; sober  since 2022.   Drug use: No   Sexual activity: Not on file  Other Topics Concern   Not on file  Social History Narrative   ** Merged History Encounter **    Right handed   Drinks caffeine   Single story   Divorced 23 years, one dog   Social Determinants of Health   Financial Resource Strain: Not on file  Food Insecurity: Not on file  Transportation Needs: Not on file  Physical Activity: Not on file  Stress: Not on file  Social Connections: Not on file    Allergies:  Allergies  Allergen Reactions   Statins Other (See Comments)    Current Medications: Current Outpatient Medications  Medication Sig Dispense Refill   amLODipine (NORVASC) 5 MG tablet Take 5 mg by mouth daily.     aspirin EC 81 MG tablet Take by mouth.     ezetimibe (ZETIA) 10 MG tablet Take 10 mg by mouth daily.     isosorbide mononitrate (IMDUR) 30 MG 24 hr tablet Take 0.5 tablets (15 mg total) by mouth daily. 45 tablet 3   metoprolol tartrate (LOPRESSOR) 25 MG tablet Take 25 mg by mouth 2 (two) times daily.     nitroGLYCERIN (NITROSTAT) 0.4 MG SL tablet Place 0.4 mg under the tongue every 5 (five) minutes as needed.     thiamine (VITAMIN B1) 100 MG tablet Take 1 tablet (100 mg total) by mouth daily. 30 tablet 2   traZODone (DESYREL) 50 MG tablet Take 1.5-2 tablets (75-100 mg total) by mouth at bedtime. 60 tablet 2   No current facility-administered medications for this visit.     Psychiatric Specialty Exam: Review of Systems  There were no vitals taken for this visit.There is no height or weight on file to calculate BMI.  General Appearance: Well Groomed  Eye Contact:  Good  Speech:  Clear and Coherent  Volume:  Normal  Mood:  Euthymic  Affect:  Congruent  Thought Process:  Coherent and Linear  Orientation:  Full (Time, Place, and Person)  Thought Content: Logical   Suicidal Thoughts:  No  Homicidal Thoughts:  No  Memory:  Immediate;   Poor  Judgement:  Good  Insight:  Fair  Psychomotor  Activity:  Normal  Concentration:  Concentration: Fair  Recall:  [employer] of Knowledge: Fair  Language: Good  Akathisia:  NA    AIMS (if indicated): not done  Assets:  Communication Skills Housing  ADL's:  Intact  Cognition: WNL  Sleep:  Fair   Metabolic Disorder  Labs: Lab Results  Component Value Date   HGBA1C 5.0 06/01/2017   MPG 96.8 06/01/2017   MPG 105 03/11/2014   No results found for: "PROLACTIN" Lab Results  Component Value Date   CHOL 159 06/02/2017   TRIG 217 (H) 06/02/2017   HDL 41 06/02/2017   CHOLHDL 3.9 06/02/2017   VLDL 43 (H) 06/02/2017   LDLCALC 75 06/02/2017   LDLCALC 63 03/08/2015   Lab Results  Component Value Date   TSH 0.58 04/06/2022   TSH 1.429 06/01/2017    Therapeutic Level Labs: No results found for: "LITHIUM" No results found for: "VALPROATE" No results found for: "CBMZ"   Screenings: GAD-7    Flowsheet Row [facility]18/20[facility]  Total GAD-7 Score 14      PHQ2-9    Flowsheet Row Office Visit from 07/18/2022 in [facility]  PHQ-2 Total Score 2  PHQ-9 Total Score 12      Flowsheet Row Office Visit from 07/18/2022 in [facility]  C-SSRS RISK CATEGORY No Risk       Collaboration of Care: Collaboration of Care: Medication Management AEB medication prescription  Patient/Guardian was advised Release of Information must be obtained prior to any record release in order to collaborate their care with an outside provider. Patient/Guardian was advised if they have not already done so to contact the registration department to sign all necessary forms in order for [place] to release information regarding their care.   Consent: Patient/Guardian gives verbal consent for treatment and assignment of benefits for services provided during this visit. Patient/Guardian expressed understanding and agreed to  proceed.    Stasia Cavalier, MD 12/14/2022, 9:30 AM   Virtual Visit via Video Note  I connected with [name] on 12/14/22 at  9:00 AM EDT by a video enabled telemedicine application and verified that I am speaking with the correct person using two identifiers.  Location: Patient: Home Provider: Home Office   I discussed the limitations of evaluation and management by telemedicine and the availability of in person appointments. The patient expressed understanding and agreed to proceed.   I discussed the assessment and treatment plan with the patient. The patient was provided an opportunity to ask questions and all were answered. The patient agreed with the plan and demonstrated an understanding of the instructions.   The patient was advised to call back or seek an in-person evaluation if the symptoms worsen or if the condition fails to improve as anticipated.  I provided 25 minutes of non-face-to-face time during this encounter.   Stasia Cavalier, MD

## 2022-12-14 ENCOUNTER — Encounter (HOSPITAL_COMMUNITY): Payer: Self-pay | Admitting: Psychiatry

## 2022-12-14 ENCOUNTER — Telehealth (HOSPITAL_BASED_OUTPATIENT_CLINIC_OR_DEPARTMENT_OTHER): Payer: Medicare (Managed Care) | Admitting: Psychiatry

## 2022-12-14 DIAGNOSIS — F331 Major depressive disorder, recurrent, moderate: Secondary | ICD-10-CM | POA: Diagnosis not present

## 2022-12-14 DIAGNOSIS — G3184 Mild cognitive impairment, so stated: Secondary | ICD-10-CM | POA: Diagnosis not present

## 2022-12-14 DIAGNOSIS — G4700 Insomnia, unspecified: Secondary | ICD-10-CM | POA: Diagnosis not present

## 2022-12-14 DIAGNOSIS — F411 Generalized anxiety disorder: Secondary | ICD-10-CM | POA: Diagnosis not present

## 2022-12-14 DIAGNOSIS — F1021 Alcohol dependence, in remission: Secondary | ICD-10-CM

## 2022-12-14 DIAGNOSIS — Z87891 Personal history of nicotine dependence: Secondary | ICD-10-CM

## 2023-01-01 ENCOUNTER — Ambulatory Visit: Payer: Medicare (Managed Care)

## 2023-01-01 ENCOUNTER — Ambulatory Visit: Payer: Medicare (Managed Care) | Admitting: Physician Assistant

## 2023-01-01 ENCOUNTER — Ambulatory Visit
Admission: EM | Admit: 2023-01-01 | Discharge: 2023-01-01 | Disposition: A | Discharge status: Home / Self Care | Payer: Medicare (Managed Care) | Attending: Physician Assistant | Admitting: Physician Assistant

## 2023-01-01 DIAGNOSIS — W19XXXA Unspecified fall, initial encounter: Secondary | ICD-10-CM

## 2023-01-01 DIAGNOSIS — S2341XA Sprain of ribs, initial encounter: Secondary | ICD-10-CM | POA: Diagnosis not present

## 2023-01-01 DIAGNOSIS — R0789 Other chest pain: Secondary | ICD-10-CM

## 2023-01-01 NOTE — ED Provider Notes (Signed)
[facility]    CSN: [id number] Arrival date & time: 01/01/23  1116      History   Chief Complaint Chief Complaint  Patient presents with   Fall    HPI Mitchell Mitchell [name] Age is Mitchell [name] 62 y.o. male.   Patient here today for evaluation of left lower rib pain after falling into Mitchell [name] metal frame around 8 days ago.  He states that he did not have significant pain immediately but over the next few days developed worsening pain.  He notes that movements, lying on his side, and taking deep breaths causes worsening pain.  He has taken Tylenol with mild relief.  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  The history is provided by the patient.  Fall Pertinent negatives include no chest pain and no shortness of breath.    Past Medical History:  Diagnosis Date   Alcohol dependence in sustained full remission    Sober for past 1 year (05/02/22)   Benign essential hypertension 12/07/2020   Chest pain 06/01/2017   Chronic low back pain 03/11/2014   Constipation 03/06/2014   Coronary artery disease of native artery of native heart with stable angina pectoris 03/04/2014   Mitchell [name]. 02/2014 Inf STEMI/PCI: LM nl, LAD min irregs, Diags 1/2/3 min irregs, LCX min irregs, OM1/2/3 min irregs, RI min irregs, RCA 100p (3.5x18 Xience DES), PDA/PL nl, EF 45-50% basal inf HK.   Dizziness, nonspecific 01/10/2021   Generalized anxiety disorder 03/04/2014   History of acute inferior wall MI 02/2014   Tx with DES to RCA   History of COVID-19 06/2018   Hyperlipidemia    Intolerance of drug    dyspnea with Brilinta >> changed to Effient    Ischemic cardiomyopathy    Mitchell [name]. EF 45-50% with inf HK at Inf STEMI // b. Echo in 10/15 with EF 55-60% with inf and inf-lat HK   Major depressive disorder    Mild cognitive impairment of uncertain or unknown etiology 05/02/2022   NSVT (nonsustained ventricular tachycardia)    Mitchell [name]. in the setting of inferior STEMI;  b.  02/2014 Echo: EF 55-60%, Gr 1 DD, basal-midinferolateral/inf HK, Gr 1 DD, triv TR.   Statin intolerance 01/09/2021   Status post coronary artery stent placement    inferior STEMI 02/2014 with 100% RCA, (single vessel disease) stented with Xience DES, EF 55-60%.  Patent stent by cath 12/08/2020.   TIA (transient ischemic attack) 01/12/2017   Ventricular ectopy 12/07/2020    Patient Active Problem List   Diagnosis Date Noted   Mild cognitive impairment of uncertain or unknown etiology 05/02/2022   Major depressive disorder    Alcohol dependence in sustained full remission (HCC)    Ischemic cardiomyopathy 05/01/2022   NSVT (nonsustained ventricular tachycardia) 05/01/2022   Dizziness, nonspecific 01/10/2021   Statin intolerance 01/09/2021   Status post coronary artery stent placement 01/09/2021   Benign essential hypertension 12/07/2020   Ventricular ectopy 12/07/2020   History of COVID-19 06/2018   Chest pain 06/01/2017   TIA (transient ischemic attack) 01/12/2017   Chronic low back pain 03/11/2014   Constipation 03/06/2014   Coronary artery disease of native artery of native heart with stable angina pectoris 03/04/2014   Hyperlipidemia 03/04/2014   Generalized anxiety disorder 03/04/2014   History of acute inferior wall MI 02/28/2014    Past Surgical History:  Procedure Laterality Date   CARDIAC CATHETERIZATION     02/28/2014 prox  RCA single vessel occlusion treated with DES   CORONARY ANGIOPLASTY WITH STENT PLACEMENT     LEFT HEART CATH Bilateral 02/28/2014   Procedure: LEFT HEART CATH;  Surgeon: Iran Ouch, MD;  Location: [facility] CATH LAB;  Service: Cardiovascular;  Laterality: Bilateral;   TONSILLECTOMY     WRIST SURGERY         Home Medications    Prior to Admission medications   Medication Sig Start Date End Date Taking? Authorizing Provider  amLODipine (NORVASC) 5 MG tablet Take 5 mg by mouth daily.   Yes [provider]  aspirin EC 81 MG tablet Take by  mouth.   Yes [provider]  ezetimibe (ZETIA) 10 MG tablet Take 10 mg by mouth daily.   Yes [provider]  isosorbide mononitrate (IMDUR) 30 MG 24 hr tablet Take 0.5 tablets (15 mg total) by mouth daily. 08/14/22  Yes Kathleene Hazel, MD  metoprolol tartrate (LOPRESSOR) 25 MG tablet Take 25 mg by mouth 2 (two) times daily. 01/10/21  Yes [provider]  nitroGLYCERIN (NITROSTAT) 0.4 MG SL tablet Place 0.4 mg under the tongue every 5 (five) minutes as needed. 08/11/22  Yes [provider]  thiamine (VITAMIN B1) 100 MG tablet Take 1 tablet (100 mg total) by mouth daily. 10/26/22  Yes Stasia Cavalier, MD  traZODone (DESYREL) 50 MG tablet Take 1.5-2 tablets (75-100 mg total) by mouth at bedtime. 10/26/22   Stasia Cavalier, MD    Family History Family History  Problem Relation Age of Onset   Parkinson's disease Mother    Hypertension Father    Heart attack Brother 73   Dementia Maternal Grandmother    Dementia Paternal Grandmother     Social History Social History   Tobacco Use   Smoking status: Former   Smokeless tobacco: Never  Vaping Use   Vaping status: Never Used  Substance Use Topics   Alcohol use: Not Currently    Comment: hx of significant alcohol abuse; sober since 2022.   Drug use: No     Allergies   Ticagrelor and Statins   Review of Systems Review of Systems  Constitutional:  Negative for chills and fever.  Eyes:  Negative for discharge and redness.  Respiratory:  Negative for shortness of breath.   Cardiovascular:  Negative for chest pain.  Gastrointestinal:  Negative for nausea and vomiting.  Musculoskeletal:  Positive for arthralgias.  Neurological:  Negative for numbness.     Physical Exam Triage Vital Signs ED Triage Vitals  Encounter Vitals Group     BP 01/01/23 1146 (!) 161/97     Systolic BP Percentile --      Diastolic BP Percentile --      Pulse Rate 01/01/23 1146 69     Resp 01/01/23 1146 18     Temp  01/01/23 1146 98.4 F (36.9 C)     Temp Source 01/01/23 1146 Oral     SpO2 01/01/23 1146 96 %     Weight 01/01/23 1144 181 lb (82.1 kg)     Height 01/01/23 1144 5' 10.5" (1.791 m)     Head Circumference --      Peak Flow --      Pain Score 01/01/23 1144 1     Pain Loc --      Pain Education --      Exclude from Growth Chart --    No data found.  Updated Vital Signs BP (!) 162/84 (BP Location: Left  Arm)   Pulse 72   Temp 98.4 F (36.9 C) (Oral)   Resp 18   Ht 5' 10.5" (1.791 m)   Wt 181 lb (82.1 kg)   SpO2 96%   BMI 25.60 kg/m   Visual Acuity Right Eye Distance:   Left Eye Distance:   Bilateral Distance:    Right Eye Near:   Left Eye Near:    Bilateral Near:     Physical Exam Vitals and nursing note reviewed.  Constitutional:      General: He is not in acute distress.    Appearance: Normal appearance. He is not ill-appearing.  HENT:     Head: Normocephalic and atraumatic.     Nose: Nose normal. No congestion or rhinorrhea.  Eyes:     Conjunctiva/sclera: Conjunctivae normal.  Cardiovascular:     Rate and Rhythm: Normal rate and regular rhythm.  Pulmonary:     Effort: Pulmonary effort is normal. No respiratory distress.     Breath sounds: Normal breath sounds. No wheezing, rhonchi or rales.  Chest:     Chest wall: Tenderness (Left lower ribs/ pain noted with deep inspiration) present.  Neurological:     Mental Status: He is alert.  Psychiatric:        Mood and Affect: Mood normal.        Behavior: Behavior normal.        Thought Content: Thought content normal.      UC Treatments / Results  Labs (all labs ordered are listed, but only abnormal results are displayed) Labs Reviewed - No data to display  EKG   Radiology DG Ribs Unilateral W/Chest Left  Result Date: 01/01/2023 CLINICAL DATA:  Fall EXAM: LEFT RIBS AND CHEST - 3+ VIEW COMPARISON:  11/20/2017 FINDINGS: No acute, displaced fracture or other bone lesions are seen involving the ribs.  Multiple chronic, callused fracture deformities of the lateral left ribs. There is no evidence of pneumothorax or pleural effusion. Both lungs are clear. Heart size and mediastinal contours are within normal limits. IMPRESSION: 1. No acute, displaced fracture or other bone lesions are seen involving the ribs. Multiple chronic, callused fracture deformities of the lateral left ribs. 2. No acute abnormality of the lungs. Electronically Signed   By: Jearld Lesch M.D.   On: 01/01/2023 13:34    Procedures Procedures (including critical care time)  Medications Ordered in UC Medications - No data to display  Initial Impression / Assessment and Plan / UC Course  I have reviewed the triage vital signs and the nursing notes.  Pertinent labs & imaging results that were available during my care of the patient were reviewed by me and considered in my medical decision making (see chart for details).    No acute fracture noted on x-ray.  Discussed possible muscular injury and recommended continued symptomatic treatment as needed.  Briefly discussed possible internal injury although unlikely given 8 days have passed since injury occurred.  Recommended follow-up in the emergency room if symptoms worsen in any way.   Final Clinical Impressions(s) / UC Diagnoses   Final diagnoses:  Fall, initial encounter   Discharge Instructions   None    ED Prescriptions   None    PDMP not reviewed this encounter.   Tomi Bamberger, PA-C 01/01/23 1728

## 2023-01-01 NOTE — ED Triage Notes (Signed)
"  I fell about 8 days ago, I am disabled/retired and was trying to move a metal frame in my house, my hand got caught and I fell forward off my feet and slammed into a metal frame, hitting upper left ribs area".

## 2023-02-21 ENCOUNTER — Telehealth: Payer: Self-pay | Admitting: Cardiovascular Disease

## 2023-02-21 NOTE — Telephone Encounter (Signed)
Called patient and rescheduled for 03/09/23 with APP.  He will have transportation then.  Appreciative for assistance.

## 2023-02-21 NOTE — Telephone Encounter (Signed)
Patient is calling because he has an appt on 10/09 and would like to change it to a virtual appt. Patient states he his having an issue with transportation. Please advise.

## 2023-02-21 NOTE — Telephone Encounter (Signed)
Would not advise virtual f/u given clinical history and prior symptoms. Will need EKG and labs at visit which are not able to be done virtually. Can reach out to social worker if patient wants for assistance with transportation, thank you so much!

## 2023-02-28 ENCOUNTER — Ambulatory Visit: Payer: Medicare (Managed Care) | Admitting: Physician Assistant

## 2023-03-06 NOTE — Progress Notes (Signed)
Cardiology Office Note:   Date:  03/09/2023  ID:  Mitchell [name], DOB 12/12/1960, MRN [id number] PCP:  Nonnie Done., MD  [facility] Providers DOD cardiologist:  Alverda Skeans, MD Primary cardiologist: Verne Carrow Referring MD: Nonnie Done., MD  Chief Complaint/Reason for Referral:  Swelling ASSESSMENT:    1. Coronary artery disease of native artery of native heart with stable angina pectoris (HCC)   2. Primary hypertension   3. Hyperlipidemia LDL goal <70   4. Swelling     PLAN:   In order of problems listed above: Coronary artery disease: The patient is out of his Imdur.  We will increase his Imdur to 30 mg and take it at bedtime.  He has a tough financial situation and is unable to afford metoprolol.  He will try to get this medication next month.  He is in the process of trying to change his insurance to a different plan.  We will enlist social work to help him with this.  Will refill his nitroglycerin and Zetia. Hypertension: Blood pressure is elevated today.  Given his financial circumstances we will continue current medical therapy and I am hopeful that next time he sees this will have more latitude to address this.  I initially wanted to change him to lisinopril or losartan however he cannot afford gasoline to come back up for lab work next week.  For this reason we have decided to remain on amlodipine despite the swelling and he is now educated on the fact that I suspect the amlodipine is related to the swelling. Hyperlipidemia: Continue Zetia; check lipid panel today.  We are limited in our ability to address his LDL.  I am hopeful that he will be able to obtain a better insurance plan I will allow [place] to manage this more effectively.            Dispo:  Return in about 3 months (around 06/09/2023).      Medication Adjustments/Labs and Tests Ordered: Current medicines are reviewed at length with the patient today.  Concerns regarding medicines are  outlined above.  The following changes have been made:     Labs/tests ordered: Orders Placed This Encounter  Procedures   EKG 12-Lead    Medication Changes: No orders of the defined types were placed in this encounter.   Current medicines are reviewed at length with the patient today.  The patient does not have concerns regarding medicines.  History of Present Illness:      FOCUSED PROBLEM LIST:   Coronary artery disease Inferior ST elevation myocardial infarction 2015 status post PCI Catheterization July 2022 [facility] with mild obstructive disease and patent RCA stent Dyspnea in response to Brilinta Hyperlipidemia  Intolerant of statins Insurance has previously denied Repatha and Praluent Hypertension BMI 25 History of alcohol abuse  The patient is a 62 year old male with above listed medical problems here for expedited DOD appointment.  He had previously been seen by Dr. Clifton James about a year ago and reported mild chest discomfort with rest and with exertion.  Imdur 15 mg was added to his regimen.  His blood pressure was well-controlled.  The patient has noticed occasional swelling in his ankles.  This comes and goes and does not happen every day.  He has been on amlodipine for some time.  He saw his primary care provider about this and no changes were suggested.  He unfortunately has a difficult insurance plan and he cannot afford his medications  on a regular basis.  For example he has been out of Imdur for a few weeks.  The Imdur was really helping his chest pain and since being out of it he has had a couple episodes of exertional angina.  He was not able to afford his metoprolol this month.  He is also out of his Zetia.  He is taking aspirin and amlodipine.  He has had no other symptoms of cardiovascular issues such as presyncope, syncope, palpitations, paroxysmal nocturnal dyspnea, orthopnea.          Current Medications: Current Meds  Medication Sig    amLODipine (NORVASC) 5 MG tablet Take 5 mg by mouth daily.   aspirin EC 81 MG tablet Take by mouth.   ezetimibe (ZETIA) 10 MG tablet Take 10 mg by mouth daily.   isosorbide mononitrate (IMDUR) 30 MG 24 hr tablet Take 0.5 tablets (15 mg total) by mouth daily.   metoprolol tartrate (LOPRESSOR) 25 MG tablet Take 25 mg by mouth 2 (two) times daily.   nitroGLYCERIN (NITROSTAT) 0.4 MG SL tablet Place 0.4 mg under the tongue every 5 (five) minutes as needed.   [DISCONTINUED] thiamine (VITAMIN B1) 100 MG tablet Take 1 tablet (100 mg total) by mouth daily.   [DISCONTINUED] traZODone (DESYREL) 50 MG tablet Take 1.5-2 tablets (75-100 mg total) by mouth at bedtime.     Review of Systems:   Please see the history of present illness.    All other systems reviewed and are negative.     EKGs/Labs/Other Test Reviewed:   EKG:    EKG Interpretation Date/Time:  Friday March 09 2023 09:24:44 EDT Ventricular Rate:  54 PR Interval:  216 QRS Duration:  104 QT Interval:  410 QTC Calculation: 388 R Axis:   -17  Text Interpretation: Sinus bradycardia with 1st degree A-V block When compared with ECG of 20-Nov-2017 18:33, Premature ventricular complexes are no longer Present T wave inversion now evident in Inferior leads Confirmed by Alverda Skeans ([id number]) on 03/09/2023 9:28:14 AM         Risk Assessment/Calculations:          Physical Exam:   VS:  BP (!) 158/90   Pulse 63   Ht 5' 10.5" (1.791 m)   Wt 190 lb (86.2 kg)   SpO2 98%   BMI 26.88 kg/m    HYPERTENSION CONTROL Vitals:   03/09/23 0918 03/09/23 0957  BP: (!) 158/90 (!) 158/90    The patient's blood pressure is elevated above target today.  In order to address the patient's elevated BP: Blood pressure will be monitored at home to determine if medication changes need to be made.      Wt Readings from Last 3 Encounters:  03/09/23 190 lb (86.2 kg)  01/01/23 181 lb (82.1 kg)  08/14/22 181 lb (82.1 kg)      GENERAL:  No apparent  distress, AOx3 HEENT:  No carotid bruits, +2 carotid impulses, no scleral icterus CAR: RRR no murmurs, gallops, rubs, or thrills RES:  Clear to auscultation bilaterally ABD:  Soft, nontender, nondistended, positive bowel sounds x 4 VASC:  +2 radial pulses, +2 carotid pulses NEURO:  CN 2-12 grossly intact; motor and sensory grossly intact PSYCH:  No active depression or anxiety EXT:  No edema, ecchymosis, or cyanosis  Signed, Orbie Pyo, MD  03/09/2023 10:00 AM    [facility] [address] [place], [place], [place]  [address] Phone: [phone]; Fax: [phone]   Note:  This document was prepared using Dragon voice recognition software and may include unintentional dictation errors.

## 2023-03-07 NOTE — Progress Notes (Unsigned)
BH MD/PA/NP OP Progress Note  03/08/2023 9:20 AM Mitchell [name]  MRN:  [id number]  Visit Diagnosis:    ICD-10-CM   1. Mild cognitive impairment of uncertain or unknown etiology  G31.84     2. Generalized anxiety disorder  F41.1     3. Moderate episode of recurrent major depressive disorder (HCC)  F33.1       Assessment: Mitchell [name] is a 62 y.o. male with a history of hypertension, hyperlipidemia, TIA 2018 (with L arm numbness blurred vision with neg MRA/MRI except for minimal SVD), "heat stroke" in 2012, CAD s/p MI, ICM, anxiety, depression, prior alcohol abuse in sustained remission, strong family history of Alzheimer's diseasewho presented to [facility] for initial evaluation on 07/18/2022.  During initial evaluation patient reported neurovegetative symptoms of depression including low mood, insomnia, worthlessness, negative self thoughts, fatigue, difficulty concentrating, and passive SI without intent or plan.  He also endorsed significant symptoms of anxiety including constant worry that he is unable to control, difficulty relaxing, restlessness, and increased irritability.  Of note patient had been having memory issues such as forgetting to turn off the stove or water.  Neuropsych testing was performed which showed mild cognitive impairment of unclear etiology, with depression/anxiety being possible causes.  Neuro workup was negative on EEG and MRI showed only minimal progression in small vessel ischemic changes that is less likely to be consistent with the severity and dropping his cognition/memory. Psychosocially patient is struggling with increased isolation and financial stressors.  He met criteria for MDD and generalized anxiety disorder.  Mitchell [name] presents for follow-up evaluation. Today, 03/08/23, patient reports that he has had some increase in depression in the last few months secondary to the loss of his dog.  He has  amotivation, increased fatigue, and worsening sleep secondary to this.  Patient response to be consistent with a grief reaction.  The review of station grief and some coping strategies.  Medication wise patient has discontinued all psychiatric medications due to limited effect and financial stressors.  We can reconsider if depressive symptoms progress in the future.  For now however we will hold them and follow up in 3 months.  Plan: - Discontinue Effexor XR 37.5 mg QD - Discontinue Trazodone 50-100 mg QHS prn for insomnia - Start thiamine 100 mg QD if affordable - Neuropsych testing from 05/02/22 reviewed, recommend repeat in 18-24 months - Recommend referral to sleep specialist - MRI brain from 05/11/22 reviewed, showing mild chronic small vessel ischemic changes minimally progressed from the brain MRI in August 2018 - EEG WNL - Crisis resources reviewed - Follow up in 3 months  Chief Complaint:  Chief Complaint  Patient presents with   Follow-up   HPI: Mitchell [name] presents today reporting that its been a rough few months. His dog got sick back in July got sick and Mitchell [name] did not have the money to help. His dog ended up passing away around last month and he has been feeling really sad since.  He can find himself looking for the dog during the days times which is difficult.  The 2 had been best friends and he feels lonely now he is gone.  [name] has been talking with his brother every day which has helped.  He also has gone on a walk the past couple days.  Outside of that he has not been very active and we did discuss the stages of grief.  Patient has been talking to his dog  where he is buried which she has found helpful.  We also suggested the idea of going to the shelter to spend some time with the dogs there.  Patient does feel like this would be a good idea.  As for adopting another dog he has considered it though is concerned about the financial aspect of being unable to afford though vet.  Patient  notes that sleep has decreased some to around 6 hours of interrupted sleep a night.  He also has decreased motivation and fatigue.  He denies any SI or thoughts of self-harm.  Medication wise patient has discontinued all psychiatric meds.  He is not interested in restarting any at this time.  Currently he is only really taking his cardiac and cholesterol medications.  Of note he is going to see his cardiologist tomorrow and we encouraged him to discuss the swelling he has been having in his legs.  Past Psychiatric History:  Patient denies any psychiatric care other than a psychiatrist at church once after his divorce who started on Zoloft for a month.  He denies prior suicide attempts or psychiatric hospitalizations.  Has been on Zoloft, Prozac (fatigue), Effexor (headaches and irritability), Xanax and Ambien in the past.  Started  trazodone on 07/18/2022.  Hx of alcohol use been in remission for 2 years. Does endorse cravings. Used to smoke marijuana several decades ago. Denies any other substance use.   Past Medical History:  Past Medical History:  Diagnosis Date   Alcohol dependence in sustained full remission    Sober for past 1 year (05/02/22)   Benign essential hypertension 12/07/2020   Chest pain 06/01/2017   Chronic low back pain 03/11/2014   Constipation 03/06/2014   Coronary artery disease of native artery of native heart with stable angina pectoris 03/04/2014   a. 02/2014 Inf STEMI/PCI: LM nl, LAD min irregs, Diags 1/2/3 min irregs, LCX min irregs, OM1/2/3 min irregs, RI min irregs, RCA 100p (3.5x18 Xience DES), PDA/PL nl, EF 45-50% basal inf HK.   Dizziness, nonspecific 01/10/2021   Generalized anxiety disorder 03/04/2014   History of acute inferior wall MI 02/2014   Tx with DES to RCA   History of COVID-19 06/2018   Hyperlipidemia    Intolerance of drug    dyspnea with Brilinta >> changed to Effient    Ischemic cardiomyopathy    a. EF 45-50% with inf HK at Inf STEMI // b.  Echo in 10/15 with EF 55-60% with inf and inf-lat HK   Major depressive disorder    Mild cognitive impairment of uncertain or unknown etiology 05/02/2022   NSVT (nonsustained ventricular tachycardia)    a. in the setting of inferior STEMI;  b. 02/2014 Echo: EF 55-60%, Gr 1 DD, basal-midinferolateral/inf HK, Gr 1 DD, triv TR.   Statin intolerance 01/09/2021   Status post coronary artery stent placement    inferior STEMI 02/2014 with 100% RCA, (single vessel disease) stented with Xience DES, EF 55-60%.  Patent stent by cath 12/08/2020.   TIA (transient ischemic attack) 01/12/2017   Ventricular ectopy 12/07/2020    Past Surgical History:  Procedure Laterality Date   CARDIAC CATHETERIZATION     02/28/2014 prox RCA single vessel occlusion treated with DES   CORONARY ANGIOPLASTY WITH STENT PLACEMENT     LEFT HEART CATH Bilateral 02/28/2014   Procedure: LEFT HEART CATH;  Surgeon: Iran Ouch, MD;  Location: [facility] CATH LAB;  Service: Cardiovascular;  Laterality: Bilateral;   TONSILLECTOMY     WRIST SURGERY  Family History:  Family History  Problem Relation Age of Onset   Parkinson's disease Mother    Hypertension Father    Heart attack Brother 39   Dementia Maternal Grandmother    Dementia Paternal Grandmother     Social History:  Social History   Socioeconomic History   Marital status: Divorced    Spouse name: Not on file   Number of children: 2   Years of education: 9   Highest education level: 9th grade  Occupational History   Occupation: Disability    Comment: telephone lineman  Tobacco Use   Smoking status: Former   Smokeless tobacco: Never  Advertising account planner   Vaping status: Never Used  Substance and Sexual Activity   Alcohol use: Not Currently    Comment: hx of significant alcohol abuse; sober since 2022.   Drug use: No   Sexual activity: Not Currently  Other Topics Concern   Not on file  Social History Narrative   ** Merged History Encounter **    Right handed    Drinks caffeine   Single story   Divorced 23 years, one dog   Social Determinants of Health   Financial Resource Strain: Not on file  Food Insecurity: Not on file  Transportation Needs: Not on file  Physical Activity: Not on file  Stress: Not on file  Social Connections: Not on file    Allergies:  Allergies  Allergen Reactions   Ticagrelor Shortness Of Breath   Statins Other (See Comments)    Other Reaction(s): Myalgias    Current Medications: Current Outpatient Medications  Medication Sig Dispense Refill   amLODipine (NORVASC) 5 MG tablet Take 5 mg by mouth daily.     aspirin EC 81 MG tablet Take by mouth.     ezetimibe (ZETIA) 10 MG tablet Take 10 mg by mouth daily.     isosorbide mononitrate (IMDUR) 30 MG 24 hr tablet Take 0.5 tablets (15 mg total) by mouth daily. 45 tablet 3   metoprolol tartrate (LOPRESSOR) 25 MG tablet Take 25 mg by mouth 2 (two) times daily.     nitroGLYCERIN (NITROSTAT) 0.4 MG SL tablet Place 0.4 mg under the tongue every 5 (five) minutes as needed.     thiamine (VITAMIN B1) 100 MG tablet Take 1 tablet (100 mg total) by mouth daily. 30 tablet 2   traZODone (DESYREL) 50 MG tablet Take 1.5-2 tablets (75-100 mg total) by mouth at bedtime. 60 tablet 2   No current facility-administered medications for this visit.     Psychiatric Specialty Exam: Review of Systems  There were no vitals taken for this visit.There is no height or weight on file to calculate BMI.  General Appearance: Well Groomed  Eye Contact:  Good  Speech:  Clear and Coherent  Volume:  Normal  Mood:  Depressed  Affect:  Congruent  Thought Process:  Coherent and Linear  Orientation:  Full (Time, Place, and Person)  Thought Content: Logical   Suicidal Thoughts:  No  Homicidal Thoughts:  No  Memory:  Immediate;   Poor  Judgement:  Good  Insight:  Fair  Psychomotor Activity:  Normal  Concentration:  Concentration: Fair  Recall:  Fair  Fund of Knowledge: Fair  Language: Good   Akathisia:  NA    AIMS (if indicated): not done  Assets:  Communication Skills Housing  ADL's:  Intact  Cognition: WNL  Sleep:  Fair   Metabolic Disorder Labs: Lab Results  Component Value Date   HGBA1C 5.0  06/01/2017   MPG 96.8 06/01/2017   MPG 105 03/11/2014   No results found for: "PROLACTIN" Lab Results  Component Value Date   CHOL 159 06/02/2017   TRIG 217 (H) 06/02/2017   HDL 41 06/02/2017   CHOLHDL 3.9 06/02/2017   VLDL 43 (H) 06/02/2017   LDLCALC 75 06/02/2017   LDLCALC 63 03/08/2015   Lab Results  Component Value Date   TSH 0.58 04/06/2022   TSH 1.429 06/01/2017    Therapeutic Level Labs: No results found for: "LITHIUM" No results found for: "VALPROATE" No results found for: "CBMZ"   Screenings: GAD-7    Flowsheet Row [facility]  Total GAD-7 Score 14      PHQ2-9    Flowsheet Row Office Visit from 07/18/2022 in [facility]  PHQ-2 Total Score 2  PHQ-9 Total Score 12      Flowsheet Row ED from 01/01/2023 in [facility] Office Visit from 07/18/2022 in [facility]  C-SSRS RISK CATEGORY No Risk No Risk       Collaboration of Care: Collaboration of Care: Other provider involved in patient's care AEB urgent care and cardiology chart review  Patient/Guardian was advised Release of Information must be obtained prior to any record release in order to collaborate their care with an outside provider. Patient/Guardian was advised if they have not already done so to contact the registration department to sign all necessary forms in order for [place] to release information regarding their care.   Consent: Patient/Guardian gives verbal consent for treatment and assignment of benefits for services provided during this visit. Patient/Guardian expressed understanding and agreed  to proceed.    Stasia Cavalier, MD 03/08/2023, 9:20 AM   Virtual Visit via Video Note  I connected with [name] on 03/08/23 at  9:00 AM EDT by a video enabled telemedicine application and verified that I am speaking with the correct person using two identifiers.  Location: Patient: Home Provider: Home Office   I discussed the limitations of evaluation and management by telemedicine and the availability of in person appointments. The patient expressed understanding and agreed to proceed.   I discussed the assessment and treatment plan with the patient. The patient was provided an opportunity to ask questions and all were answered. The patient agreed with the plan and demonstrated an understanding of the instructions.   The patient was advised to call back or seek an in-person evaluation if the symptoms worsen or if the condition fails to improve as anticipated.  30 minutes were spent in chart review, interview, psycho education, counseling, medical decision making, coordination of care and long-term prognosis.  Patient was given opportunity to ask question and all concerns and questions were addressed and answers. Excluding separately billable services.   Stasia Cavalier, MD

## 2023-03-08 ENCOUNTER — Telehealth (HOSPITAL_COMMUNITY): Payer: Medicare (Managed Care) | Admitting: Psychiatry

## 2023-03-08 ENCOUNTER — Encounter (HOSPITAL_COMMUNITY): Payer: Self-pay | Admitting: Psychiatry

## 2023-03-08 DIAGNOSIS — G3184 Mild cognitive impairment, so stated: Secondary | ICD-10-CM | POA: Diagnosis not present

## 2023-03-08 DIAGNOSIS — F411 Generalized anxiety disorder: Secondary | ICD-10-CM | POA: Diagnosis not present

## 2023-03-08 DIAGNOSIS — F1011 Alcohol abuse, in remission: Secondary | ICD-10-CM | POA: Diagnosis not present

## 2023-03-08 DIAGNOSIS — F331 Major depressive disorder, recurrent, moderate: Secondary | ICD-10-CM

## 2023-03-08 DIAGNOSIS — F4321 Adjustment disorder with depressed mood: Secondary | ICD-10-CM

## 2023-03-09 ENCOUNTER — Ambulatory Visit: Payer: Medicare (Managed Care) | Attending: Internal Medicine | Admitting: Internal Medicine

## 2023-03-09 ENCOUNTER — Telehealth (HOSPITAL_BASED_OUTPATIENT_CLINIC_OR_DEPARTMENT_OTHER): Payer: Self-pay | Admitting: Licensed Clinical Social Worker

## 2023-03-09 ENCOUNTER — Ambulatory Visit: Payer: Medicare (Managed Care) | Admitting: Nurse Practitioner

## 2023-03-09 ENCOUNTER — Encounter: Payer: Self-pay | Admitting: Internal Medicine

## 2023-03-09 VITALS — BP 158/90 | HR 63 | Ht 70.5 in | Wt 190.0 lb

## 2023-03-09 DIAGNOSIS — R609 Edema, unspecified: Secondary | ICD-10-CM

## 2023-03-09 DIAGNOSIS — E785 Hyperlipidemia, unspecified: Secondary | ICD-10-CM

## 2023-03-09 DIAGNOSIS — I10 Essential (primary) hypertension: Secondary | ICD-10-CM | POA: Diagnosis not present

## 2023-03-09 DIAGNOSIS — Z5971 Insufficient health insurance coverage: Secondary | ICD-10-CM

## 2023-03-09 DIAGNOSIS — I25118 Atherosclerotic heart disease of native coronary artery with other forms of angina pectoris: Secondary | ICD-10-CM | POA: Diagnosis not present

## 2023-03-09 MED ORDER — NITROGLYCERIN 0.4 MG SL SUBL: 0.4000 mg | SUBLINGUAL_TABLET | SUBLINGUAL | 3 refills | Status: DC | PRN

## 2023-03-09 MED ORDER — EZETIMIBE 10 MG PO TABS: 10.0000 mg | ORAL_TABLET | Freq: Every day | ORAL | 3 refills | Status: AC

## 2023-03-09 MED ORDER — ISOSORBIDE MONONITRATE ER 30 MG PO TB24: 30.0000 mg | ORAL_TABLET | Freq: Every day | ORAL | 3 refills | Status: DC

## 2023-03-09 NOTE — Telephone Encounter (Signed)
H&V Care Navigation CSW Progress Note  Clinical Social Worker contacted patient by phone to f/u after appt with [facility] this morning. Attempted pt at [phone], unable to leave voicemail as it is full. Our team will f/u as able.   Patient is participating in a Managed Medicaid Plan:  No, [employer] Medicare (OON)  SDOH Screenings   Depression (PHQ2-9): High Risk (07/18/2022)  Tobacco Use: Medium Risk (03/09/2023)    Octavio Graves, MSW, LCSW Clinical Social Worker II [facility] Care Navigation  [phone]- work cell phone (preferred) [phone]- desk phone

## 2023-03-09 NOTE — Patient Instructions (Addendum)
Medication Instructions:  Your physician has recommended you make the following change in your medication:  1.) increase IMDUR to 30 mg - take one tablet at bedtime Pick up med refills at [facility] and call [facility] to get set up with them and confirm costs of medicines   [phone] --if zero cost at [facility] - will send new prescriptions there. *If you need a refill on your cardiac medications before your next appointment, please call your pharmacy*   Lab Work: Today:  lipid panel  If you have labs (blood work) drawn today and your tests are completely normal, you will receive your results only by: MyChart Message (if you have MyChart) OR A paper copy in the mail If you have any lab test that is abnormal or we need to change your treatment, we will call you to review the results.   Testing/Procedures: none   Follow-Up: At [facility], you and your health needs are our priority.  As part of our continuing mission to provide you with exceptional heart care, we have created designated Provider Care Teams.  These Care Teams include your primary Cardiologist (physician) and Advanced Practice Providers (APPs -  Physician Assistants and Nurse Practitioners) who all work together to provide you with the care you need, when you need it.   Your next appointment:   3 month(s)  Provider:   Verne Carrow, MD  or Advanced Practice Provider (NP or PA-C)   Other Instructions You have been referred to Social Services/Care Navigation for assistance with medications and transportation

## 2023-03-09 NOTE — Progress Notes (Signed)
Heart and Vascular Care Navigation  03/09/2023  Mitchell [name] February 05, 1961 [id number]  Reason for Referral: financial challenges Patient is participating in a Managed Medicaid Plan: No, wellcare medicare only  Engaged with patient by telephone for initial visit for Heart and Vascular Care Coordination.                                                                                                   Assessment:     LCSW received a call back from pt [phone]). Introduced self, role, reason for call. Confirmed home address, PCP, and insurance. Pt made aware of [facility] Medicare showing to be OON- he confirms he has been paying high copays and thought it may be. Pt confirmed emergency contacts, he receives SSDI, has not worked in a few years. He pays fixed rent to family member for his housing, he does have a car but shares gas is expensive. He is not behind on any utilities at this time. He does not receive any assistance with his food at this time. He was not payiny for medications so fell behind with previous pharmacy [facility]) and is currently using [facility]. He would like to stay there.   We discussed using SHIIP counselors to apply for Extra Help program or to adjust current insurance during open enrollment period. He is interested in information offered about food assistance, transportation and goodrx as well should cash price be cheaper. We will also send LIEAP application since he may be eligible beginning in December.                                 HRT/VAS Care Coordination     Patients Home Cardiology Office [facility]   Outpatient Care Team Social Worker   Social Worker Name: Octavio Graves, [place], [phone]   Living arrangements for the past 2 months Single Family Home   Lives with: Self   Patient Current Insurance Coverage Managed Medicare   Patient Has Concern With Paying Medical Bills Yes   Patient Concerns With Medical Bills lots of copays   Medical Bill  Referrals: SHIIP counseling to discuss if pt needs to enroll in different plan to better meet his needs/discuss options   Does Patient Have Prescription Coverage? Yes   Home Assistive Devices/Equipment None       Social History:                                                                             SDOH Screenings   Food Insecurity: Food Insecurity Present (03/09/2023)  Housing: Low Risk  (03/09/2023)  Transportation Needs: Unmet Transportation Needs (03/09/2023)  Utilities: Not At Risk (03/09/2023)  Depression (PHQ2-9): High Risk (07/18/2022)  Financial Resource Strain: High Risk (03/09/2023)  Tobacco  Use: Medium Risk (03/09/2023)  Health Literacy: Adequate Health Literacy (03/09/2023)    SDOH Interventions: Financial Resources:  Financial Strain Interventions: Other (Comment) (sent SHIIP information to ensure pt medical coverage is best and if eligible enroll in savings programs; food pantry resources; [employer] card [employer]; good rx card if wants to use cash price; LIEAP application) DSS for financial assistance  Food Insecurity:  Food Insecurity Interventions: Other (Comment) (will send food pantries list and SNAP referral card)  Housing Insecurity:  Housing Interventions: Other (Comment) (pt family own the pt home- they charge rent but is controlled, pt able to afford at this time)  Transportation:   Transportation Interventions: Other (Comment), Payor Benefit (discussed contacting member services with his insurance for assistance; mailed additional community transportation resources)    Other Care Navigation Interventions:     Provided Pharmacy assistance resources  We discussed speaking to [facility] counseling about LIS/Extra Help, finding the right plan, possibly using cash price for medications- unfortunately only on    Follow-up plan:   LCSW mailed pt the following: my card, [facility], Animal nutritionist, [employer] app and instrctions, [employer] and Chiropodist, Transportation resources. These were also emailed to [email or web address]. I will f/u to ensure that they were received/answer any additional questions.

## 2023-03-10 LAB — LIPID PANEL
Chol/HDL Ratio: 3.7 {ratio} (ref 0.0–5.0)
Cholesterol, Total: 253 mg/dL — ABNORMAL HIGH (ref 100–199)
HDL: 68 mg/dL (ref >39)
LDL Chol Calc (NIH): 171 mg/dL — ABNORMAL HIGH (ref 0–99)
Triglycerides: 81 mg/dL (ref 0–149)
VLDL Cholesterol Cal: 14 mg/dL (ref 5–40)

## 2023-03-15 ENCOUNTER — Telehealth: Payer: Self-pay | Admitting: Licensed Clinical Social Worker

## 2023-03-15 ENCOUNTER — Telehealth (HOSPITAL_COMMUNITY): Payer: Medicare (Managed Care) | Admitting: Psychiatry

## 2023-03-15 NOTE — Telephone Encounter (Signed)
H&V Care Navigation CSW Progress Note  Clinical Social Worker contacted patient by phone to f/u on resources emailed and mailed. Pt has received his email and will save [facility] number for reference, and was again encouraged again to apply for LIEAP when that application period begins and to contact our team as needed.   Patient is participating in a Managed Medicaid Plan:  No, [employer] Medicare Advantage OON- pt aware this is noted as OON  SDOH Screenings   Food Insecurity: Food Insecurity Present (03/09/2023)  Housing: Low Risk  (03/09/2023)  Transportation Needs: Unmet Transportation Needs (03/09/2023)  Utilities: Not At Risk (03/09/2023)  Depression (PHQ2-9): High Risk (07/18/2022)  Financial Resource Strain: High Risk (03/09/2023)  Tobacco Use: Medium Risk (03/09/2023)  Health Literacy: Adequate Health Literacy (03/09/2023)    Octavio Graves, MSW, LCSW Clinical Social Worker [facility] Heart/Vascular Care Navigation  [phone]- work cell phone (preferred) [phone]- desk phone

## 2023-06-04 NOTE — Progress Notes (Signed)
BH MD/PA/NP OP Progress Note  06/07/2023 9:27 AM Mitchell [name]  MRN:  [id number]  Visit Diagnosis:    ICD-10-CM   1. Mild cognitive impairment of uncertain or unknown etiology  G31.84     2. Generalized anxiety disorder  F41.1 traZODone (DESYREL) 50 MG tablet    3. Moderate episode of recurrent major depressive disorder (HCC)  F33.1 traZODone (DESYREL) 50 MG tablet       Assessment: Mitchell [name] is a 63 y.o. male with a history of hypertension, hyperlipidemia, TIA 2018 (with L arm numbness blurred vision with neg MRA/MRI except for minimal SVD), "heat stroke" in 2012, CAD s/p MI, ICM, anxiety, depression, prior alcohol abuse in sustained remission, strong family history of Alzheimer's diseasewho presented to [facility] for initial evaluation on 07/18/2022.  During initial evaluation patient reported neurovegetative symptoms of depression including low mood, insomnia, worthlessness, negative self thoughts, fatigue, difficulty concentrating, and passive SI without intent or plan.  He also endorsed significant symptoms of anxiety including constant worry that he is unable to control, difficulty relaxing, restlessness, and increased irritability.  Of note patient had been having memory issues such as forgetting to turn off the stove or water.  Neuropsych testing was performed which showed mild cognitive impairment of unclear etiology, with depression/anxiety being possible causes.  Neuro workup was negative on EEG and MRI showed only minimal progression in small vessel ischemic changes that is less likely to be consistent with the severity and dropping his cognition/memory. Psychosocially patient is struggling with increased isolation and financial stressors.  He met criteria for MDD and generalized anxiety disorder.  Mitchell [name] presents for follow-up evaluation. Today, 06/07/23, patient reports that he was severely depressed in the interim following  a loss of his dog.  It does appear this was a bit more than a grief reaction as he was anhedonic, amotivated, fatigue, and spends nearly 6 to 8 weeks lying in bed.  He did end up getting a new dog which has helped improve his mood symptoms.  Patient's social isolation is a major contributor to his mood symptoms so having a pet companion will be beneficial.  Financially things are still tight making other outside engagement difficult.  Patient had endorsed insomnia and we will restart trazodone today.  With his new insurance the medication should be covered.  We will follow up in 3 months.  Psychotherapeutic interventions were used during today's session. From 9:02 AM to 9:20 AM we worked on supportive and motivational psychotherapy.  Particularly around encouraging patient to continue to get out of bed and work to set up a daily routine.  He has had some improvement with this and his behavioral activation since getting a new dog.  Plan: - Restart Trazodone 50-100 mg QHS prn for insomnia - Start thiamine 100 mg QD if affordable - Neuropsych testing from 05/02/22 reviewed, recommend repeat in 18-24 months - Recommend referral to sleep specialist - MRI brain from 05/11/22 reviewed, showing mild chronic small vessel ischemic changes minimally progressed from the brain MRI in August 2018 - EEG WNL - Crisis resources reviewed - Follow up in 3 months  Chief Complaint:  Chief Complaint  Patient presents with   Follow-up   HPI: Mitchell [name] presents today reporting that its been a tough few months. After he lost his dog he did not get out of bed for 6-8 weeks. He was not taking care of his ADL's, was and tonic, and amotivated.  He denied  any thoughts of suicide during that time. He had not realized how important the dog was and how depressed he was after his loss. He finally decided to get another dog and has started to feel a bit better after that. He has been getting up and going about his day now.    Physically patient reports that he has had a couple of falls 1 of which was rather serious off a ladder.  He did not end up breaking anything but his ribs were bruised for some time.  Memory has also continued to be an issue.  Patient has gotten to the point that he has decided to cut back and attempt to completely eliminate his stove usage.  There have been several accidents of him forgetting that the stove is on or burning things excessively.  There was also an incident where he went to the grocery store but then forgot to put this groceries in his car.  Luckily his son helped him out financially that that time since that was all his food for the month.  Sleep has been an issue for the past couple months.  Patient notes that his schedule has been irregular and then he often can wake up shortly after falling asleep.  In the past he had taken the trazodone with good effect.  We suggested restarting that medication today which patient was agreeable with.  He notes that he did get a new insurance which allows this provider to be in network and will cover many of his medications without co-pays.   Past Psychiatric History:  Patient denies any psychiatric care other than a psychiatrist at church once after his divorce who started on Zoloft for a month.  He denies prior suicide attempts or psychiatric hospitalizations.  Has been on Zoloft, Prozac (fatigue), Effexor (headaches and irritability), Xanax and Ambien in the past.  Started  trazodone on 07/18/2022.  Hx of alcohol use been in remission for 2 years. Does endorse cravings. Used to smoke marijuana several decades ago. Denies any other substance use.   Past Medical History:  Past Medical History:  Diagnosis Date   Alcohol dependence in sustained full remission    Sober for past 1 year (05/02/22)   Benign essential hypertension 12/07/2020   Chest pain 06/01/2017   Chronic low back pain 03/11/2014   Constipation 03/06/2014   Coronary artery  disease of native artery of native heart with stable angina pectoris 03/04/2014   a. 02/2014 Inf STEMI/PCI: LM nl, LAD min irregs, Diags 1/2/3 min irregs, LCX min irregs, OM1/2/3 min irregs, RI min irregs, RCA 100p (3.5x18 Xience DES), PDA/PL nl, EF 45-50% basal inf HK.   Dizziness, nonspecific 01/10/2021   Generalized anxiety disorder 03/04/2014   History of acute inferior wall MI 02/2014   Tx with DES to RCA   History of COVID-19 06/2018   Hyperlipidemia    Intolerance of drug    dyspnea with Brilinta >> changed to Effient    Ischemic cardiomyopathy    a. EF 45-50% with inf HK at Inf STEMI // b. Echo in 10/15 with EF 55-60% with inf and inf-lat HK   Major depressive disorder    Mild cognitive impairment of uncertain or unknown etiology 05/02/2022   NSVT (nonsustained ventricular tachycardia)    a. in the setting of inferior STEMI;  b. 02/2014 Echo: EF 55-60%, Gr 1 DD, basal-midinferolateral/inf HK, Gr 1 DD, triv TR.   Statin intolerance 01/09/2021   Status post coronary artery stent placement  inferior STEMI 02/2014 with 100% RCA, (single vessel disease) stented with Xience DES, EF 55-60%.  Patent stent by cath 12/08/2020.   TIA (transient ischemic attack) 01/12/2017   Ventricular ectopy 12/07/2020    Past Surgical History:  Procedure Laterality Date   CARDIAC CATHETERIZATION     02/28/2014 prox RCA single vessel occlusion treated with DES   CORONARY ANGIOPLASTY WITH STENT PLACEMENT     LEFT HEART CATH Bilateral 02/28/2014   Procedure: LEFT HEART CATH;  Surgeon: Iran Ouch, MD;  Location: [facility] CATH LAB;  Service: Cardiovascular;  Laterality: Bilateral;   TONSILLECTOMY     WRIST SURGERY      Family History:  Family History  Problem Relation Age of Onset   Parkinson's disease Mother    Hypertension Father    Heart attack Brother 78   Dementia Maternal Grandmother    Dementia Paternal Grandmother     Social History:  Social History   Socioeconomic History   Marital  status: Divorced    Spouse name: Not on file   Number of children: 2   Years of education: 9   Highest education level: 9th grade  Occupational History   Occupation: Disability    Comment: telephone lineman  Tobacco Use   Smoking status: Former   Smokeless tobacco: Never  Advertising account planner   Vaping status: Never Used  Substance and Sexual Activity   Alcohol use: Not Currently    Comment: hx of significant alcohol abuse; sober since 2022.   Drug use: No   Sexual activity: Not Currently  Other Topics Concern   Not on file  Social History Narrative   ** Merged History Encounter **    Right handed   Drinks caffeine   Single story   Divorced 23 years, one dog   Social Drivers of Health   Financial Resource Strain: High Risk (03/09/2023)   Overall Financial Resource Strain (CARDIA)    Difficulty of Paying Living Expenses: Hard  Food Insecurity: Food Insecurity Present (03/09/2023)   Hunger Vital Sign    Worried About Running Out of Food in the Last Year: Sometimes true    Ran Out of Food in the Last Year: Sometimes true  Transportation Needs: Unmet Transportation Needs (03/09/2023)   PRAPARE - Administrator, Civil Service (Medical): Yes    Lack of Transportation (Non-Medical): Yes  Physical Activity: Not on file  Stress: Not on file  Social Connections: Not on file    Allergies:  Allergies  Allergen Reactions   Ticagrelor Shortness Of Breath   Statins Other (See Comments)    Other Reaction(s): Myalgias    Current Medications: Current Outpatient Medications  Medication Sig Dispense Refill   traZODone (DESYREL) 50 MG tablet Take 1-2 tablets (50-100 mg total) by mouth at bedtime. 180 tablet 0   amLODipine (NORVASC) 5 MG tablet Take 5 mg by mouth daily.     aspirin EC 81 MG tablet Take by mouth.     ezetimibe (ZETIA) 10 MG tablet Take 1 tablet (10 mg total) by mouth daily. 90 tablet 3   isosorbide mononitrate (IMDUR) 30 MG 24 hr tablet Take 1 tablet (30 mg  total) by mouth at bedtime. 90 tablet 3   metoprolol tartrate (LOPRESSOR) 25 MG tablet Take 25 mg by mouth 2 (two) times daily.     nitroGLYCERIN (NITROSTAT) 0.4 MG SL tablet Place 1 tablet (0.4 mg total) under the tongue every 5 (five) minutes as needed. 25 tablet 3   No  current facility-administered medications for this visit.     Psychiatric Specialty Exam: Review of Systems  There were no vitals taken for this visit.There is no height or weight on file to calculate BMI.  General Appearance: Well Groomed  Eye Contact:  Good  Speech:  Clear and Coherent  Volume:  Normal  Mood:  Euthymic  Affect:  Congruent  Thought Process:  Coherent and Linear  Orientation:  Full (Time, Place, and Person)  Thought Content: Logical   Suicidal Thoughts:  No  Homicidal Thoughts:  No  Memory:  Immediate;   Poor  Judgement:  Good  Insight:  Fair  Psychomotor Activity:  Normal  Concentration:  Concentration: Fair  Recall:  Fair  Fund of Knowledge: Fair  Language: Good  Akathisia:  NA    AIMS (if indicated): not done  Assets:  Communication Skills Housing  ADL's:  Intact  Cognition: WNL  Sleep:  Fair   Metabolic Disorder Labs: Lab Results  Component Value Date   HGBA1C 5.0 06/01/2017   MPG 96.8 06/01/2017   MPG 105 03/11/2014   No results found for: "PROLACTIN" Lab Results  Component Value Date   CHOL 253 (H) 03/09/2023   TRIG 81 03/09/2023   HDL 68 03/09/2023   CHOLHDL 3.7 03/09/2023   VLDL 43 (H) 06/02/2017   LDLCALC 171 (H) 03/09/2023   LDLCALC 75 06/02/2017   Lab Results  Component Value Date   TSH 0.58 04/06/2022   TSH 1.429 06/01/2017    Therapeutic Level Labs: No results found for: "LITHIUM" No results found for: "VALPROATE" No results found for: "CBMZ"   Screenings: GAD-7    Flowsheet Row [facility]/20[facility]  Total GAD-7 Score 14      PHQ2-9    Flowsheet Row Office Visit from 07/18/2022 in  [facility]  PHQ-2 Total Score 2  PHQ-9 Total Score 12      Flowsheet Row ED from [facility]01/2023 in [facility]) Office Visit from 07/18/2022 in [facility]  C-SSRS RISK CATEGORY No Risk No Risk       Collaboration of Care: Collaboration of Care: Other provider involved in patient's care AEB cardiology chart review  Patient/Guardian was advised Release of Information must be obtained prior to any record release in order to collaborate their care with an outside provider. Patient/Guardian was advised if they have not already done so to contact the registration department to sign all necessary forms in order for [place] to release information regarding their care.   Consent: Patient/Guardian gives verbal consent for treatment and assignment of benefits for services provided during this visit. Patient/Guardian expressed understanding and agreed to proceed.    Stasia Cavalier, MD 06/07/2023, 9:27 AM   Virtual Visit via Video Note  I connected with [name] on 06/07/23 at  9:00 AM EST by a video enabled telemedicine application and verified that I am speaking with the correct person using two identifiers.  Location: Patient: Home Provider: Home Office   I discussed the limitations of evaluation and management by telemedicine and the availability of in person appointments. The patient expressed understanding and agreed to proceed.   I discussed the assessment and treatment plan with the patient. The patient was provided an opportunity to ask questions and all were answered. The patient agreed with the plan and demonstrated an understanding of the instructions.   The patient was advised to call  back or seek an in-person evaluation if the symptoms worsen or if the condition fails to improve as anticipated.  30 minutes were spent in chart review, interview, psycho education,  counseling, medical decision making, coordination of care and long-term prognosis.  Patient was given opportunity to ask question and all concerns and questions were addressed and answers. Excluding separately billable services.   Stasia Cavalier, MD

## 2023-06-07 ENCOUNTER — Telehealth (HOSPITAL_COMMUNITY): Payer: Medicare PPO | Admitting: Psychiatry

## 2023-06-07 ENCOUNTER — Encounter (HOSPITAL_COMMUNITY): Payer: Self-pay | Admitting: Psychiatry

## 2023-06-07 DIAGNOSIS — F331 Major depressive disorder, recurrent, moderate: Secondary | ICD-10-CM | POA: Diagnosis not present

## 2023-06-07 DIAGNOSIS — G3184 Mild cognitive impairment, so stated: Secondary | ICD-10-CM

## 2023-06-07 DIAGNOSIS — F411 Generalized anxiety disorder: Secondary | ICD-10-CM | POA: Diagnosis not present

## 2023-06-07 MED ORDER — TRAZODONE HCL 50 MG PO TABS
50.0000 mg | ORAL_TABLET | Freq: Every day | ORAL | 0 refills | Status: DC
Start: 2023-06-07 — End: 2024-02-12

## 2023-06-19 ENCOUNTER — Telehealth: Payer: Self-pay | Admitting: Cardiovascular Disease

## 2023-06-19 MED ORDER — METOPROLOL TARTRATE 25 MG PO TABS: 25.0000 mg | ORAL_TABLET | Freq: Two times a day (BID) | ORAL | 2 refills | Status: DC

## 2023-06-19 NOTE — Telephone Encounter (Signed)
*  STAT* If patient is at the pharmacy, call can be transferred to refill team.   1. Which medications need to be refilled? (please list name of each medication and dose if known) isosorbide mononitrate (IMDUR) 30 MG 24 hr tablet   2. Which pharmacy/location (including street and city if local pharmacy) is medication to be sent to? [facility] - [place], [place] - [address]   3. Do they need a 30 day or 90 day supply? 90

## 2023-06-20 ENCOUNTER — Telehealth: Payer: Self-pay | Admitting: Cardiovascular Disease

## 2023-06-20 NOTE — Telephone Encounter (Signed)
Pt c/o medication issue:  1. Name of Medication: Repatha  2. How are you currently taking this medication (dosage and times per day)? Not currently taking  3. Are you having a reaction (difficulty breathing--STAT)? No 4. What is your medication issue? Patient is calling because he has new insurance this year. Last year, the copay amount was over $500 for the medication. Since the patient now has new insurance, the copay will be less than $50. Patient would like to know if he can now start this medication or does he need to be seen again before writing the prescription. Please advise.

## 2023-06-20 NOTE — Telephone Encounter (Signed)
Pt is returning LPN call and requesting a callback. Please advise

## 2023-06-20 NOTE — Telephone Encounter (Signed)
Pt aware pharmD/scheduler will call to arrange OV to discuss further now that he can afford it. Pt agreeable to plan.

## 2023-06-20 NOTE — Telephone Encounter (Signed)
Mailbox full

## 2023-06-25 ENCOUNTER — Telehealth: Payer: Self-pay | Admitting: Cardiovascular Disease

## 2023-06-25 MED ORDER — ISOSORBIDE MONONITRATE ER 30 MG PO TB24: 30.0000 mg | ORAL_TABLET | Freq: Every day | ORAL | 3 refills | Status: DC

## 2023-06-25 NOTE — Telephone Encounter (Signed)
Pt c/o medication issue:  1. Name of Medication: Metoprolol Succinate  2. How are you currently taking this medication (dosage and times per day)? Not taking  3. Are you having a reaction (difficulty breathing--STAT)? No   4. What is your medication issue? Pt called in to get his  isosorbide mononitrate (IMDUR) 30 MG 24 hr tablet  refilled. The pharmacy sent him the Metoprolol Succinate instead. He is taking metoprolol tartrate (LOPRESSOR) 25 MG tablet . I told him not to take the wrong Metoprolol until he talks to a nurse to see what they say.

## 2023-06-25 NOTE — Telephone Encounter (Signed)
Scheduled patient for a video visit, 02/04 at 8:30 am. Okay per Melissa.

## 2023-06-25 NOTE — Telephone Encounter (Signed)
Called and reviewed with patient. He has on his med list from 12/2020 Lopressor 25 mg - one tab twice a day We have never filled this before - his PCP usually does.  His PCP office sent a prescription of Toprol 25 mg twice a day on 06/04/23 He called and asked our office for a refill of isosorbide 30 mg daily at bedtime.  Our office sent a prescription for Lopressor 25 mg .    Both metoprolols came to him via the mail order pharmacy and he is unclear what to take and still does not have IMDUR.'  He confirmed by the Toprol bottle that PCP sent that in, and he has only been continuing to take the Lopressor so he is going to ask his PCP office if they intended for him to change from short to long acting or if it was sent in error.  I sent the IMDUR prescription to [facility].  Pt grateful for assistance.

## 2023-06-26 ENCOUNTER — Ambulatory Visit: Payer: Medicare PPO | Attending: Cardiology | Admitting: Pharmacist

## 2023-06-26 DIAGNOSIS — E7800 Pure hypercholesterolemia, unspecified: Secondary | ICD-10-CM

## 2023-06-26 NOTE — Progress Notes (Addendum)
 Patient ID: Mitchell [name]                 DOB: 1960-12-19                    MRN: [id number]     Patient Consent for Virtual Visit    Mitchell [name] has provided verbal consent on 06/26/2023 for a virtual visit (video or telephone).   CONSENT FOR VIRTUAL VISIT FOR:  Mitchell [name]  By participating in this virtual visit I agree to the following:  I hereby voluntarily request, consent and authorize [facility] and its employed or contracted physicians, physician assistants, nurse practitioners or other licensed health care professionals (the Practitioner), to provide me with telemedicine health care services (the "Services) as deemed necessary by the treating Practitioner. I acknowledge and consent to receive the Services by the Practitioner via telemedicine. I understand that the telemedicine visit will involve communicating with the Practitioner through live audiovisual communication technology and the disclosure of certain medical information by electronic transmission. I acknowledge that I have been given the opportunity to request an in-person assessment or other available alternative prior to the telemedicine visit and am voluntarily participating in the telemedicine visit.  I understand that I have the right to withhold or withdraw my consent to the use of telemedicine in the course of my care at any time, without affecting my right to future care or treatment, and that the Practitioner or I may terminate the telemedicine visit at any time. I understand that I have the right to inspect all information obtained and/or recorded in the course of the telemedicine visit and may receive copies of available information for a reasonable fee.  I understand that some of the potential risks of receiving the Services via telemedicine include:  Delay or interruption in medical evaluation due to technological equipment failure or disruption; Information transmitted may not be sufficient  (e.g. poor resolution of images) to allow for appropriate medical decision making by the Practitioner; and/or  In rare instances, security protocols could fail, causing a breach of personal health information.  Furthermore, I acknowledge that it is my responsibility to provide information about my medical history, conditions and care that is complete and accurate to the best of my ability. I acknowledge that Practitioner's advice, recommendations, and/or decision may be based on factors not within their control, such as incomplete or inaccurate data provided by me or distortions of diagnostic images or specimens that may result from electronic transmissions. I understand that the practice of medicine is not an exact science and that Practitioner makes no warranties or guarantees regarding treatment outcomes. I acknowledge that a copy of this consent can be made available to me via my patient portal [facility] MyChart), or I can request a printed copy by calling the office of [facility].    I understand that my insurance will be billed for this visit.   I have read or had this consent read to me. I understand the contents of this consent, which adequately explains the benefits and risks of the Services being provided via telemedicine.  I have been provided ample opportunity to ask questions regarding this consent and the Services and have had my questions answered to my satisfaction. I give my informed consent for the services to be provided through the use of telemedicine in my medical care     Virtual Visit via Video Note   Because of Mitchell [name]'s co-morbid  illnesses, he is at least at moderate risk for complications without adequate follow up.  This format is felt to be most appropriate for this patient at this time.  All issues noted in this document were discussed and addressed.  A limited physical exam was performed with this format.  Please refer to the patient's chart for  his consent to telehealth for [facility].   Evaluation Performed:  Preoperative cardiovascular risk assessment _____________   Patient Location:  Home Provider location:   Office  HPI: Mitchell [name] is a 63 y.o. male patient referred to lipid clinic by Dr. Verlin. PMH is significant for CAD, prior alcohol abuse, HTN, hyperlipidemia, anxiety, depression, prior TIA, PVCs and NSVT. 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 A drug eluting stent was placed in the RCA.    Patient with intolerance to statins. Has been on ezetimibe  for several years. LDL-C not controlled. Could not afford Repatha  last year. Has changed insurances and sounds like he may now have LIS.   Patient reports his activity level has decreased due to some depression and lack of energy. Does still walk him dog every morning. Tries to force himself to walk and get out of the house. Does do some, but not as regularly as before.   Eats 2 meals a day, tries to limit sugar. Bad habit is porkrinds. We talked about avoiding saturated fats (pork rinds, butter).  Reviewed Repatha , injection technique, efficacy and side effects. Also reviewed [employer].  Current Medications: ezetimibe  10mg  daily Intolerances: atorvastatin  20mg  daily, lovastatin  40mg  daily (joint and feet swelling) Risk Factors: premature CAD, HTN LDL-C goal: <55 ApoB goal: <60  Diet:  Breakfast: egg or two sometimes Lunch:  Usually eats twice a day Chicken, peas, carrots Drinks:diet coolaid, water , coffee Tries to stay away from pasta, bread Snack: porkrinds  Exercise: use to try to walk daily but hasn't been doing much lately. Does walk his dog every morning around the yard.  Family History: father, brother   Social History:   Labs: Lipid Panel     Component Value Date/Time   CHOL 253 (H) 03/09/2023 1036   TRIG 81 03/09/2023 1036   HDL 68 03/09/2023 1036   CHOLHDL 3.7 03/09/2023 1036   CHOLHDL 3.9  06/02/2017 0535   VLDL 43 (H) 06/02/2017 0535   LDLCALC 171 (H) 03/09/2023 1036   LABVLDL 14 03/09/2023 1036    Past Medical History:  Diagnosis Date   Alcohol dependence in sustained full remission    Sober for past 1 year (05/02/22)   Benign essential hypertension 12/07/2020   Chest pain 06/01/2017   Chronic low back pain 03/11/2014   Constipation 03/06/2014   Coronary artery disease of native artery of native heart with stable angina pectoris 03/04/2014   a. 02/2014 Inf STEMI/PCI: LM nl, LAD min irregs, Diags 1/2/3 min irregs, LCX min irregs, OM1/2/3 min irregs, RI min irregs, RCA 100p (3.5x18 Xience DES), PDA/PL nl, EF 45-50% basal inf HK.   Dizziness, nonspecific 01/10/2021   Generalized anxiety disorder 03/04/2014   History of acute inferior wall MI 02/2014   Tx with DES to RCA   History of COVID-19 06/2018   Hyperlipidemia    Intolerance of drug    dyspnea with Brilinta  >> changed to Effient     Ischemic cardiomyopathy    a. EF 45-50% with inf HK at Inf STEMI // b. Echo in 10/15 with EF 55-60% with inf and inf-lat HK  Major depressive disorder    Mild cognitive impairment of uncertain or unknown etiology 05/02/2022   NSVT (nonsustained ventricular tachycardia)    a. in the setting of inferior STEMI;  b. 02/2014 Echo: EF 55-60%, Gr 1 DD, basal-midinferolateral/inf HK, Gr 1 DD, triv TR.   Statin intolerance 01/09/2021   Status post coronary artery stent placement    inferior STEMI 02/2014 with 100% RCA, (single vessel disease) stented with Xience DES, EF 55-60%.  Patent stent by cath 12/08/2020.   TIA (transient ischemic attack) 01/12/2017   Ventricular ectopy 12/07/2020    Current Outpatient Medications on File Prior to Visit  Medication Sig Dispense Refill   amLODipine (NORVASC) 5 MG tablet Take 5 mg by mouth daily.     aspirin  EC 81 MG tablet Take by mouth.     ezetimibe  (ZETIA ) 10 MG tablet Take 1 tablet (10 mg total) by mouth daily. 90 tablet 3   isosorbide   mononitrate (IMDUR ) 30 MG 24 hr tablet Take 1 tablet (30 mg total) by mouth at bedtime. 90 tablet 3   metoprolol  tartrate (LOPRESSOR ) 25 MG tablet Take 1 tablet (25 mg total) by mouth 2 (two) times daily. 180 tablet 2   nitroGLYCERIN  (NITROSTAT ) 0.4 MG SL tablet Place 1 tablet (0.4 mg total) under the tongue every 5 (five) minutes as needed. 25 tablet 3   traZODone  (DESYREL ) 50 MG tablet Take 1-2 tablets (50-100 mg total) by mouth at bedtime. 180 tablet 0   No current facility-administered medications on file prior to visit.    Allergies  Allergen Reactions   Ticagrelor  Shortness Of Breath   Statins Other (See Comments)    Other Reaction(s): Myalgias    Assessment/Plan:  1. Hyperlipidemia -  Hyperlipidemia Assessment: LDL-C is above goal of <55 due to premature CAD On ezetimibe  10mg  daily. LDL-C is 171 Intolerant to atorvastatin  and lovastatin   Plan: Submit PA for Repatha  Will apply for [employer] in 2-3 months Continue ezetimibe  10mg  daily    I spent 30 min reviewing and educating patient  Thank you,  Sylena Lotter D Azani Brogdon, Pharm.D, BCACP, CPP [facility] [address], [place], [place] [address]  Phone: [phone]; Fax: [phone]

## 2023-06-26 NOTE — Assessment & Plan Note (Signed)
 Assessment: LDL-C is above goal of <55 due to premature CAD On ezetimibe  10mg  daily. LDL-C is 171 Intolerant to atorvastatin  and lovastatin   Plan: Submit PA for Repatha  Will apply for [employer] in 2-3 months Continue ezetimibe  10mg  daily

## 2023-06-28 ENCOUNTER — Encounter: Payer: Self-pay | Admitting: Pharmacist

## 2023-06-28 ENCOUNTER — Other Ambulatory Visit (HOSPITAL_COMMUNITY): Payer: Self-pay

## 2023-06-28 ENCOUNTER — Telehealth: Payer: Self-pay | Admitting: Pharmacy Technician

## 2023-06-28 ENCOUNTER — Other Ambulatory Visit: Payer: Self-pay

## 2023-06-28 MED ORDER — REPATHA SURECLICK 140 MG/ML ~~LOC~~ SOAJ
1.0000 mL | SUBCUTANEOUS | 11 refills | Status: DC
  Filled 2023-06-28: qty 2, 28d supply, fill #0
  Filled 2023-07-17 – 2023-07-19 (×2): qty 2, 28d supply, fill #1
  Filled 2023-08-24: qty 2, 28d supply, fill #2
  Filled 2023-09-14 – 2023-09-15 (×2): qty 2, 28d supply, fill #3
  Filled 2023-10-08: qty 2, 28d supply, fill #4

## 2023-06-28 NOTE — Telephone Encounter (Signed)
 Pharmacy Patient Advocate Encounter  Received notification from [employer] that Prior Authorization for repatha  has been APPROVED from 05/23/23 to 05/21/24. Ran test claim, Copay is $484.58 one month (DEDUCTIBLE). This test claim was processed through [facility]- copay amounts may vary at other pharmacies due to pharmacy/plan contracts, or as the patient moves through the different stages of their insurance plan.   PA #/Case ID/Reference #: [id number]

## 2023-06-28 NOTE — Addendum Note (Signed)
 Addended by: Edson Deridder D on: 06/28/2023 02:10 PM   Modules accepted: Orders

## 2023-06-28 NOTE — Telephone Encounter (Signed)
 Pharmacy Patient Advocate Encounter   Received notification from Physician's Office that prior authorization for repatha  is required/requested.   Insurance verification completed.   The patient is insured through [employer] .   Per test claim: PA required; PA submitted to above mentioned insurance via CoverMyMeds Key/confirmation #/EOC [facility] Status is pending

## 2023-06-28 NOTE — Telephone Encounter (Signed)
-----   Message from Benancio Bracket sent at 06/27/2023  8:30 PM EST ----- Please do PA for Repatha

## 2023-06-28 NOTE — Telephone Encounter (Signed)
 Added grant info and mail preference in [place].

## 2023-06-28 NOTE — Telephone Encounter (Signed)
 Pt set up for delivery at [facility] and [employer] grant has been started. Just need to send proof of medicare    CARD NO.?[id number]   CARD STATUS?Active   UXL?[id number]   PCN?[id number]   PC [id number]?[id number]

## 2023-07-17 ENCOUNTER — Other Ambulatory Visit: Payer: Self-pay

## 2023-07-17 ENCOUNTER — Other Ambulatory Visit (HOSPITAL_COMMUNITY): Payer: Self-pay

## 2023-07-26 ENCOUNTER — Ambulatory Visit (INDEPENDENT_AMBULATORY_CARE_PROVIDER_SITE_OTHER): Payer: Medicare PPO | Admitting: Family Medicine

## 2023-07-26 ENCOUNTER — Encounter: Payer: Self-pay | Admitting: Family Medicine

## 2023-07-26 VITALS — BP 136/74 | HR 97 | Ht 70.5 in | Wt 186.8 lb

## 2023-07-26 DIAGNOSIS — Z125 Encounter for screening for malignant neoplasm of prostate: Secondary | ICD-10-CM | POA: Diagnosis not present

## 2023-07-26 DIAGNOSIS — E7800 Pure hypercholesterolemia, unspecified: Secondary | ICD-10-CM

## 2023-07-26 DIAGNOSIS — Z1212 Encounter for screening for malignant neoplasm of rectum: Secondary | ICD-10-CM

## 2023-07-26 DIAGNOSIS — Z1211 Encounter for screening for malignant neoplasm of colon: Secondary | ICD-10-CM

## 2023-07-26 DIAGNOSIS — I255 Ischemic cardiomyopathy: Secondary | ICD-10-CM | POA: Diagnosis not present

## 2023-07-26 DIAGNOSIS — R052 Subacute cough: Secondary | ICD-10-CM

## 2023-07-26 DIAGNOSIS — Z131 Encounter for screening for diabetes mellitus: Secondary | ICD-10-CM

## 2023-07-26 DIAGNOSIS — Z7689 Persons encountering health services in other specified circumstances: Secondary | ICD-10-CM

## 2023-07-26 DIAGNOSIS — I25118 Atherosclerotic heart disease of native coronary artery with other forms of angina pectoris: Secondary | ICD-10-CM | POA: Diagnosis not present

## 2023-07-26 NOTE — Assessment & Plan Note (Signed)
 Likely had a viral infection like flu or COVID based on his symptoms.  This was 2 weeks ago so no testing done today.  Was afebrile and O2 sats were normal.  Endorsed starting to feel better over the past few days so antibiotics were not prescribed for possible bacterial infection.  Discussed symptomatic management with over-the-counter medications.

## 2023-07-26 NOTE — Assessment & Plan Note (Signed)
 Continue management per cardiology.  Will hold off on getting lipid panel today since he has an upcoming appointment in a few months and recently started Repatha.

## 2023-07-26 NOTE — Patient Instructions (Signed)
 It was nice to see you today,  We addressed the following topics today: -We will get some blood test today.  I will follow-up with the results of these by sending you a MyChart message or call you if needed - In a month we can do a virtual visit - For your symptoms you can take over-the-counter medications such as the following: For cough: Dextromethorphan and guaifenesin.  You can take 600 mg of guaifenesin every 6 hours or 1200 mg every 12 hours - For fevers body aches you can take it as milligrams Tylenol every 8 hours - For sore throat you can take Cepacol numbing lozenges For nasal congestion you can take nasal saline and Afrin.  Have a great day,  Frederic Jericho, MD
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
## 2023-07-27 ENCOUNTER — Encounter: Payer: Self-pay | Admitting: Family Medicine

## 2023-07-27 LAB — CBC
Hematocrit: 42.6 % (ref 37.5–51.0)
Hemoglobin: 14.7 g/dL (ref 13.0–17.7)
MCH: 33.3 pg — ABNORMAL HIGH (ref 26.6–33.0)
MCHC: 34.5 g/dL (ref 31.5–35.7)
MCV: 97 fL (ref 79–97)
Platelets: 210 10*3/uL (ref 150–450)
RBC: 4.41 x10E6/uL (ref 4.14–5.80)
RDW: 12 % (ref 11.6–15.4)
WBC: 4.2 10*3/uL (ref 3.4–10.8)

## 2023-07-27 LAB — COMPREHENSIVE METABOLIC PANEL
ALT: 111 IU/L — ABNORMAL HIGH (ref 0–44)
AST: 62 IU/L — ABNORMAL HIGH (ref 0–40)
Albumin: 4.2 g/dL (ref 3.9–4.9)
Alkaline Phosphatase: 111 IU/L (ref 44–121)
BUN/Creatinine Ratio: 15 (ref 10–24)
BUN: 14 mg/dL (ref 8–27)
Bilirubin Total: 0.4 mg/dL (ref 0.0–1.2)
CO2: 23 mmol/L (ref 20–29)
Calcium: 8.7 mg/dL (ref 8.6–10.2)
Chloride: 106 mmol/L (ref 96–106)
Creatinine, Ser: 0.93 mg/dL (ref 0.76–1.27)
Globulin, Total: 2.4 g/dL (ref 1.5–4.5)
Glucose: 89 mg/dL (ref 70–99)
Potassium: 4.3 mmol/L (ref 3.5–5.2)
Sodium: 141 mmol/L (ref 134–144)
Total Protein: 6.6 g/dL (ref 6.0–8.5)
eGFR: 92 mL/min/{1.73_m2} (ref >59)

## 2023-07-27 LAB — PSA: Prostate Specific Ag, Serum: 0.6 ng/mL (ref 0.0–4.0)

## 2023-07-27 LAB — HEMOGLOBIN A1C
Est. average glucose Bld gHb Est-mCnc: 111 mg/dL
Hgb A1c MFr Bld: 5.5 % (ref 4.8–5.6)

## 2023-08-09 ENCOUNTER — Ambulatory Visit: Payer: Medicare (Managed Care) | Admitting: Physician Assistant

## 2023-08-24 ENCOUNTER — Other Ambulatory Visit (HOSPITAL_COMMUNITY): Payer: Self-pay

## 2023-08-25 ENCOUNTER — Other Ambulatory Visit (HOSPITAL_COMMUNITY): Payer: Self-pay | Admitting: Psychiatry

## 2023-08-25 DIAGNOSIS — F331 Major depressive disorder, recurrent, moderate: Secondary | ICD-10-CM

## 2023-08-25 DIAGNOSIS — F411 Generalized anxiety disorder: Secondary | ICD-10-CM

## 2023-09-03 ENCOUNTER — Telehealth: Admitting: Family Medicine

## 2023-09-03 ENCOUNTER — Encounter: Payer: Self-pay | Admitting: Family Medicine

## 2023-09-03 VITALS — Ht 70.5 in | Wt 186.0 lb

## 2023-09-03 DIAGNOSIS — R052 Subacute cough: Secondary | ICD-10-CM

## 2023-09-03 DIAGNOSIS — F331 Major depressive disorder, recurrent, moderate: Secondary | ICD-10-CM

## 2023-09-03 DIAGNOSIS — Z5941 Food insecurity: Secondary | ICD-10-CM | POA: Insufficient documentation

## 2023-09-03 DIAGNOSIS — R252 Cramp and spasm: Secondary | ICD-10-CM | POA: Insufficient documentation

## 2023-09-03 DIAGNOSIS — R7989 Other specified abnormal findings of blood chemistry: Secondary | ICD-10-CM | POA: Diagnosis not present

## 2023-09-03 DIAGNOSIS — I951 Orthostatic hypotension: Secondary | ICD-10-CM | POA: Insufficient documentation

## 2023-09-03 MED ORDER — MAGNESIUM OXIDE -MG SUPPLEMENT 400 (240 MG) MG PO TABS: 400.0000 mg | ORAL_TABLET | Freq: Every day | ORAL | 2 refills | Status: DC

## 2023-09-03 NOTE — Assessment & Plan Note (Signed)
Cough has resolved 

## 2023-09-03 NOTE — Assessment & Plan Note (Signed)
 Both AST and ALT elevated, ALT greater than AST.  This was drawn while patient was recovering from an acute respiratory illness which may have affected his lipid values.  Has a history of alcohol use but is currently sober for several years.  Will recheck at his next visit.

## 2023-09-03 NOTE — Progress Notes (Signed)
 Established Patient Office Visit  Subjective   Patient ID: Mitchell [name], male    DOB: 03-Apr-1961  Age: 63 y.o. MRN: [id number]  Chief Complaint  Patient presents with   Medical Management of Chronic Issues    HPI Patient location: Home Provider location: [facility] Method of visit: Video Duration of call 25 minutes  Patient here for follow-up of his initial visit 1 month ago in which she had had a significant respiratory illness.  He is recovering from that.  Almost completely resolved.  We discussed the patient's lab tests.  His only abnormal finding was elevated AST and ALT.  He was recovering from illness at this time which may be why it was elevated.  Will repeat and if still elevated need further workup.  Patient has severe leg cramps at night.  Has to get up and walk them off.  Patient is on a limited income.  Does not drive typically because of stress involved with driving, but also lack of money for gasoline and generally does not have anywhere to go.  On disability and usually has disability and EBT does not last him the whole month and sometimes goes several days without eating anything.  Patient states that a few weeks ago he stood up from his chair, felt like his vision was going and he felt lightheaded.  He sat back down and symptoms got better but vision remained blurry for about the rest of the day.  He says he has a history of TIAs or has been told he has TIAs in the past he had no other symptoms other than the "white out" of his vision that lasted a few minutes and resolved when he sat back down.  Patient feels depressed.  He endorses passive thoughts of wanting to die.  No active thoughts of self-harm.  He sees a psychiatrist and has tried medications in the past, not currently on any medications for depression.  His depression stems from his financial situation and back pain that limits his ability to do the things he used to enjoy.  The ASCVD Risk score  (Arnett DK, et al., 2019) failed to calculate for the following reasons:   Risk score cannot be calculated because patient has a medical history suggesting prior/existing ASCVD  Health Maintenance Due  Topic Date Due   Medicare Annual Wellness (AWV)  Never done   Hepatitis C Screening  Never done   DTaP/Tdap/Td (1 - Tdap) Never done   Pneumococcal Vaccine 5-16 Years old (1 of 2 - PCV) Never done   Colonoscopy  Never done   Zoster Vaccines- Shingrix (1 of 2) Never done   COVID-19 Vaccine (1 - 2024-25 season) Never done      Objective:     Ht 5' 10.5" (1.791 m)   Wt 186 lb (84.4 kg)   BMI 26.31 kg/m    Physical Exam General: Alert, oriented Pulmonary: Able to speak full sentences without difficulty.  No respiratory stress Psych: Blunted affect.  Sometimes becomes more emotional when talking about his depression.   No results found for any visits on 09/03/23.      Assessment & Plan:   Moderate episode of recurrent major depressive disorder [facility]) Assessment & Plan: Patient sees his psychiatrist currently.  He endorses passive thoughts of wishing he was better off dead.  No active suicidal thoughts.  Depressed mainly due to his financial situation and health.  Advised patient we will send in referral to see if we can  help with his food insecurity and financial assistance.  Recommend that he continue talking with his psychiatrist regarding his depression.  Patient has been on Zoloft Prozac Effexor Xanax and Ambien in the past.  Now currently taking trazodone at night   Subacute cough Assessment & Plan: Cough has resolved.   Nocturnal muscle cramp Assessment & Plan: Recommend patient use magnesium oxide 400 mg.  Patient on limited budget.  Will send in as a prescription to see if this is more cost effective.   Elevated LFTs Assessment & Plan: Both AST and ALT elevated, ALT greater than AST.  This was drawn while patient was recovering from an acute respiratory  illness which may have affected his lipid values.  Has a history of alcohol use but is currently sober for several years.  Will recheck at his next visit.   Food insecurity Assessment & Plan: Patient income includes disability and EBT.  Towards the end of the month he states he often goes anywhere between 1 to 4 days without eating due to finances.  Also driving and ability to leave his house is limited due to being unable to afford gas.  Leaves house about twice a month.  Advised him we will send in a referral for him to discuss with social worker about resources available to him.  Orders: -     AMB Referral [facility] Care Management  Orthostasis Assessment & Plan: Patient's description of standing up, feeling his vision going out and improving after he sat back down is most consistent with symptoms of orthostasis but he does have a history of TIAs he states.  Did not have any other symptoms beyond this.  Blurred vision lasted the rest of the day but improved afterwards.   Other orders -     Magnesium Oxide -Mg Supplement; Take 1 tablet (400 mg total) by mouth daily.  Dispense: 90 tablet; Refill: 2     No follow-ups on file.    Laneta Pintos, MD

## 2023-09-03 NOTE — Assessment & Plan Note (Signed)
 Recommend patient use magnesium oxide 400 mg.  Patient on limited budget.  Will send in as a prescription to see if this is more cost effective.

## 2023-09-03 NOTE — Assessment & Plan Note (Signed)
 Patient's description of standing up, feeling his vision going out and improving after he sat back down is most consistent with symptoms of orthostasis but he does have a history of TIAs he states.  Did not have any other symptoms beyond this.  Blurred vision lasted the rest of the day but improved afterwards.

## 2023-09-03 NOTE — Assessment & Plan Note (Signed)
 Patient income includes disability and EBT.  Towards the end of the month he states he often goes anywhere between 1 to 4 days without eating due to finances.  Also driving and ability to leave his house is limited due to being unable to afford gas.  Leaves house about twice a month.  Advised him we will send in a referral for him to discuss with social worker about resources available to him.

## 2023-09-03 NOTE — Telephone Encounter (Signed)
 Copied from CRM [id number]. Topic: Appointments - Appointment Info/Confirmation >> Sep 03, 2023  3:24 PM Lizabeth Riggs wrote: Patient/patient representative is calling for information regarding an appointment.  Mitchell [name] said July 14 at 11:10 AM works for him and I made note her confirmed.

## 2023-09-03 NOTE — Assessment & Plan Note (Addendum)
 Patient sees his psychiatrist currently.  He endorses passive thoughts of wishing he was better off dead.  No active suicidal thoughts.  Depressed mainly due to his financial situation and health.  Advised patient we will send in referral to see if we can help with his food insecurity and financial assistance.  Recommend that he continue talking with his psychiatrist regarding his depression.  Patient has been on Zoloft Prozac Effexor Xanax and Ambien in the past.  Now currently taking trazodone at night

## 2023-09-04 ENCOUNTER — Telehealth: Payer: Self-pay

## 2023-09-04 NOTE — Progress Notes (Signed)
   Telephone encounter was:  Successful.  Complex Care Management Note Care Guide Note  09/04/2023 Name: Mitchell [name] MRN: [id number] DOB: Oct 07, 1960  Mitchell [name] is a 63 y.o. year old male who is a primary care patient of Arabella Beach Linford Ribas, MD . The community resource team was consulted for assistance with Food Insecurity  SDOH screenings and interventions completed:  Yes  Social Drivers of Health From This Encounter   Financial Resource Strain: High Risk (09/04/2023)   Overall Financial Resource Strain (CARDIA)    Difficulty of Paying Living Expenses: Very hard  Utilities: Not At Risk (09/04/2023)   Utilities    Threatened with loss of utilities: No    SDOH Interventions Today    Flowsheet Row Most Recent Value  SDOH Interventions   Financial Strain Interventions Community Resources Provided, [id number] Referral        Care guide performed the following interventions: Patient provided with information about care guide support team and interviewed to confirm resource needs.Patient is out of food and refuses any help. He has allowed me to put a referral into [facility] and mail other resources   Follow Up Plan:  No further follow up planned at this time. The patient has been provided with needed resources.  Encounter Outcome:  Patient Visit Completed    Azell Leopard [facility]  [facility] Guide, Phone: [phone] Fax: [phone] Website: [email or web address]

## 2023-09-10 NOTE — Progress Notes (Unsigned)
 BH MD/PA/NP OP Progress Note  09/13/2023 9:31 AM MANUEL DALL  MRN:  [id number]  Visit Diagnosis:    ICD-10-CM   1. Mild cognitive impairment of uncertain or unknown etiology  G31.84     2. Generalized anxiety disorder  F41.1     3. Moderate episode of recurrent major depressive disorder (HCC)  F33.1       Assessment: Mitchell [name] is a 63 y.o. 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  During initial evaluation patient reported neurovegetative symptoms of depression including low mood, insomnia, worthlessness, negative self thoughts, fatigue, difficulty concentrating, and passive SI without intent or plan.  He also endorsed significant symptoms of anxiety including constant worry that he is unable to control, difficulty relaxing, restlessness, and increased irritability.  Of note patient had been having memory issues such as forgetting to turn off the stove or water.  Neuropsych testing was performed which showed mild cognitive impairment of unclear etiology, with depression/anxiety being possible causes.  Neuro workup was negative on EEG and MRI showed only minimal progression in small vessel ischemic changes that is less likely to be consistent with the severity and dropping his cognition/memory. Psychosocially patient is struggling with increased isolation and financial stressors.  He met criteria for MDD and generalized anxiety disorder.  Mitchell [name] presents for follow-up evaluation. Today, 09/13/23, patient reports that he is struggling with mild depression though has improved ever since he has gotten a new dog.  Psychosocial  stressors of financial difficulties and somatic concerns persist which are the main contributors to his depression.  Insomnia has improved with trazodone  however he only takes intermittently.  Of note he does endorse snoring and choking episodes during the night.  Would benefit from sleep study the patient reports only being able to do home study.  Referral was placed today.  We will continue trazodone  and follow up in 5 to 6 months  Psychotherapeutic interventions were used during today's session. From 9:08 AM to 9:25 AM . Therapeutic interventions included empathic listening, supportive therapy, cognitive and behavioral therapy, motivational interviewing. Used supportive interviewing techniques to provide emotional validation. Worked on cognitive reframing techniques and recommendations made for behavioral activation.  Improvement was evidenced by patient's participation and identified commitment to therapy goals.    Plan: - Restart Trazodone  50-100 mg QHS prn for insomnia - Start thiamine  100 mg QD if affordable - Neuropsych testing from 05/02/22 reviewed, recommend repeat in 18-24 months - Sleep study referral placed - MRI brain from 05/11/22 reviewed, showing mild chronic small vessel ischemic changes minimally progressed from the brain MRI in August 2018 - EEG WNL - Crisis resources reviewed - Follow up in 5-6 months  Chief Complaint:  Chief Complaint  Patient presents with   Follow-up   HPI: Mitchell [name] presents today reporting that he is hanging there. Things have been the "same old same old" the past few months.  He still reports struggling with a mild depression related to his life situation.  Finances remain tight and he is only able to afford to leave the house a couple times a month ago to the grocery store or to the bank.  He also has no extra money for hobbies  such as fishing or tinkering with electronics which he liked to do in the past.  He has found that keeping up with caring for the  house is getting monotonous.  Mitchell [name] would like to get back into work, but does not feel like his body or mind would be able to keep up.  Patient denies any active SI but is endorsing passive SI.  He describes it as not feeling like he can keep doing this routine for the next 10 years and is hopeful the George Kinder takes him before that.  Patient does have social supports through his dog which he lists as a significant benefit.  He still remembers the time after his last dog passed and he had no companion and his depression was much more severe than.  In regards to insomnia patient reports still struggling with restless nights.  When he does take the trazodone  is improved though he only does it every few days.  He question sleep apnea as he snores and wakes up with difficulty breathing throughout the night.  He is interested in sleep study as long as he can be done in the home.  We referred him for home sleep study today.  Notably patient's memory issues are still ongoing with no significant change.  It is possible that underlying sleep apnea could be contributing to this.  Past Psychiatric History:  Patient denies any psychiatric care other than a psychiatrist at church once after his divorce who started on Zoloft for a month.  He denies prior suicide attempts or psychiatric hospitalizations.  Has been on Zoloft, Prozac  (fatigue), Effexor  (headaches and irritability), Xanax  and Ambien  in the past.  Started  trazodone  on 07/18/2022.  Hx of alcohol use been in remission for 2 years. Does endorse cravings. Used to smoke marijuana several decades ago. Denies any other substance use.   Past Medical History:  Past Medical History:  Diagnosis Date   Alcohol dependence in sustained full remission    Sober for past 1 year (05/02/22)   Benign essential hypertension 12/07/2020   Chest pain 06/01/2017   Chronic low back pain 03/11/2014   Constipation 03/06/2014   Coronary artery disease of native artery of native  heart with stable angina pectoris 03/04/2014   a. 02/2014 Inf STEMI/PCI: LM nl, LAD min irregs, Diags 1/2/3 min irregs, LCX min irregs, OM1/2/3 min irregs, RI min irregs, RCA 100p (3.5x18 Xience DES), PDA/PL nl, EF 45-50% basal inf HK.   Dizziness, nonspecific 01/10/2021   Generalized anxiety disorder 03/04/2014   History of acute inferior wall MI 02/2014   Tx with DES to RCA   History of COVID-19 06/2018   Hyperlipidemia    Intolerance of drug    dyspnea with Brilinta  >> changed to Effient     Ischemic cardiomyopathy    a. EF 45-50% with inf HK at Inf STEMI // b. Echo in 10/15 with EF 55-60% with inf and inf-lat HK   Major depressive disorder    Mild cognitive impairment of uncertain or unknown etiology 05/02/2022   NSVT (nonsustained ventricular tachycardia)    a. in the setting of inferior STEMI;  b. 02/2014 Echo: EF 55-60%, Gr 1 DD, basal-midinferolateral/inf HK, Gr 1 DD, triv TR.   Statin intolerance 01/09/2021   Status post coronary artery stent placement    inferior STEMI 02/2014 with 100% RCA, (single vessel disease) stented with Xience DES, EF 55-60%.  Patent stent by cath 12/08/2020.   TIA (transient ischemic attack) 01/12/2017   Ventricular ectopy 12/07/2020  Past Surgical History:  Procedure Laterality Date   CARDIAC CATHETERIZATION     02/28/2014 prox RCA single vessel occlusion treated with DES   CORONARY ANGIOPLASTY WITH STENT PLACEMENT     LEFT HEART CATH Bilateral 02/28/2014   Procedure: LEFT HEART CATH;  Surgeon: Wenona Hamilton, MD;  Location: [facility] CATH LAB;  Service: Cardiovascular;  Laterality: Bilateral;   TONSILLECTOMY     WRIST SURGERY      Family History:  Family History  Problem Relation Age of Onset   Parkinson's disease Mother    Hypertension Father    Heart attack Brother 63   Dementia Maternal Grandmother    Dementia Paternal Grandmother     Social History:  Social History   Socioeconomic History   Marital status: Divorced    Spouse name:  Not on file   Number of children: 2   Years of education: 9   Highest education level: 9th grade  Occupational History   Occupation: Disability    Comment: telephone lineman  Tobacco Use   Smoking status: Former   Smokeless tobacco: Never  Advertising account planner   Vaping status: Never Used  Substance and Sexual Activity   Alcohol use: Not Currently    Comment: hx of significant alcohol abuse; sober since 2022.   Drug use: No   Sexual activity: Not Currently  Other Topics Concern   Not on file  Social History Narrative   ** Merged History Encounter **    Right handed   Drinks caffeine   Single story   Divorced 23 years, one dog   Social Drivers of Health   Financial Resource Strain: High Risk (09/04/2023)   Overall Financial Resource Strain (CARDIA)    Difficulty of Paying Living Expenses: Very hard  Food Insecurity: Food Insecurity Present (03/09/2023)   Hunger Vital Sign    Worried About Running Out of Food in the Last Year: Sometimes true    Ran Out of Food in the Last Year: Sometimes true  Transportation Needs: Unmet Transportation Needs (03/09/2023)   PRAPARE - Administrator, Civil Service (Medical): Yes    Lack of Transportation (Non-Medical): Yes  Physical Activity: Not on file  Stress: Not on file  Social Connections: Not on file    Allergies:  Allergies  Allergen Reactions   Ticagrelor  Shortness Of Breath   Statins Other (See Comments)    Other Reaction(s): Myalgias    Current Medications: Current Outpatient Medications  Medication Sig Dispense Refill   amLODipine (NORVASC) 5 MG tablet Take 5 mg by mouth daily.     aspirin  EC 81 MG tablet Take by mouth.     Evolocumab  (REPATHA  SURECLICK) 140 MG/ML SOAJ Inject 140 mg into the skin every 14 (fourteen) days. 2 mL 11   ezetimibe  (ZETIA ) 10 MG tablet Take 1 tablet (10 mg total) by mouth daily. 90 tablet 3   isosorbide  mononitrate (IMDUR ) 30 MG 24 hr tablet Take 1 tablet (30 mg total) by mouth at bedtime.  90 tablet 3   magnesium  oxide (MAG-OX) 400 (240 Mg) MG tablet Take 1 tablet (400 mg total) by mouth daily. 90 tablet 2   metoprolol  tartrate (LOPRESSOR ) 25 MG tablet Take 1 tablet (25 mg total) by mouth 2 (two) times daily. 180 tablet 2   nitroGLYCERIN  (NITROSTAT ) 0.4 MG SL tablet Place 1 tablet (0.4 mg total) under the tongue every 5 (five) minutes as needed. 25 tablet 3   traZODone  (DESYREL ) 50 MG tablet Take 1-2 tablets (50-100  mg total) by mouth at bedtime. (Patient taking differently: Take 50-100 mg by mouth at bedtime. One tablet during the day for anxiety and 1 tablet for sleep at bedtime) 180 tablet 0   No current facility-administered medications for this visit.     Psychiatric Specialty Exam: Review of Systems  There were no vitals taken for this visit.There is no height or weight on file to calculate BMI.  General Appearance: Well Groomed  Eye Contact:  Good  Speech:  Clear and Coherent  Volume:  Normal  Mood:   dysthymic  Affect:  Congruent  Thought Process:  Coherent and Linear  Orientation:  Full (Time, Place, and Person)  Thought Content: Logical   Suicidal Thoughts:  No  Homicidal Thoughts:  No  Memory:  Immediate;   Poor  Judgement:  Good  Insight:  Fair  Psychomotor Activity:  Normal  Concentration:  Concentration: Fair  Recall:  Fair  Fund of Knowledge: Fair  Language: Good  Akathisia:  NA    AIMS (if indicated): not done  Assets:  Communication Skills Housing  ADL's:  Intact  Cognition: WNL  Sleep:  Fair   Metabolic Disorder Labs: Lab Results  Component Value Date   HGBA1C 5.5 07/26/2023   MPG 96.8 06/01/2017   MPG 105 03/11/2014   No results found for: "PROLACTIN" Lab Results  Component Value Date   CHOL 253 (H) 03/09/2023   TRIG 81 03/09/2023   HDL 68 03/09/2023   CHOLHDL 3.7 03/09/2023   VLDL 43 (H) 06/02/2017   LDLCALC 171 (H) 03/09/2023   LDLCALC 75 06/02/2017   Lab Results  Component Value Date   TSH 0.58 04/06/2022   TSH 1.429  06/01/2017    Therapeutic Level Labs: No results found for: "LITHIUM" No results found for: "VALPROATE" No results found for: "CBMZ"   Screenings: GAD-7    Flowsheet Row Office Visit from 07/26/2023 in [facility] Office Visit from 07/18/2022 in [facility]  Total GAD-7 Score 17 14      PHQ2-9    Flowsheet Row Video Visit from 09/03/2023 in [facility] Visit from 07/26/2023 in [facility] Office Visit from [facility]  PHQ-2 Total Score 1 4 2   PHQ-9 Total Score 4 23 12       Flowsheet Row UC from 01/01/2023 in [facility] Office Visit from 07/18/2022 in [facility]  C-SSRS RISK CATEGORY No Risk No Risk       Collaboration of Care: Collaboration of Care: Other provider involved in patient's care AEB PCP chart review  Patient/Guardian was advised Release of Information must be obtained prior to any record release in order to collaborate their care with an outside provider. Patient/Guardian was advised if they have not already done so to contact the registration department to sign all necessary forms in order for us  to release information regarding their care.   Consent: Patient/Guardian gives verbal consent for treatment and assignment of benefits for services provided during this visit. Patient/Guardian expressed understanding and agreed to proceed.    Yves Herb, MD 09/13/2023, 9:31 AM   Virtual Visit via Video Note  I connected with Mitchell [name] on 09/13/23 at  9:00 AM EDT by a video enabled telemedicine application and verified that I am speaking with the correct person using two identifiers.  Location:  Patient: Home Provider: Home Office   I discussed the limitations of evaluation and management by  telemedicine and the availability of in person appointments. The patient expressed understanding and agreed to proceed.   I discussed the assessment and treatment plan with the patient. The patient was provided an opportunity to ask questions and all were answered. The patient agreed with the plan and demonstrated an understanding of the instructions.   The patient was advised to call back or seek an in-person evaluation if the symptoms worsen or if the condition fails to improve as anticipated.  I provided 25 minutes of non-face-to-face time during this encounter.  Yves Herb, MD

## 2023-09-13 ENCOUNTER — Encounter (HOSPITAL_COMMUNITY): Payer: Self-pay | Admitting: Psychiatry

## 2023-09-13 ENCOUNTER — Telehealth (HOSPITAL_COMMUNITY): Payer: Medicare (Managed Care) | Admitting: Psychiatry

## 2023-09-13 DIAGNOSIS — F331 Major depressive disorder, recurrent, moderate: Secondary | ICD-10-CM | POA: Diagnosis not present

## 2023-09-13 DIAGNOSIS — F411 Generalized anxiety disorder: Secondary | ICD-10-CM

## 2023-09-13 DIAGNOSIS — G3184 Mild cognitive impairment, so stated: Secondary | ICD-10-CM | POA: Diagnosis not present

## 2023-09-15 ENCOUNTER — Other Ambulatory Visit (HOSPITAL_COMMUNITY): Payer: Self-pay

## 2023-09-25 ENCOUNTER — Telehealth: Payer: Self-pay

## 2023-09-25 NOTE — Telephone Encounter (Signed)
 Copied from CRM [id number]. Topic: Clinical - Medication Question >> Sep 25, 2023  1:32 PM Talmadge Fail S wrote: Reason for CRM: Patient is requesting Dr. Arabella Beach write out  prescription for him . He has a possible infection in one of his teeth on the right side. He reports his gums, ear canal, and right side of his face is hurting. Please contact patient at [phone].

## 2023-09-26 ENCOUNTER — Other Ambulatory Visit: Payer: Self-pay

## 2023-09-26 MED ORDER — AMOXICILLIN-POT CLAVULANATE 875-125 MG PO TABS: 1.0000 | ORAL_TABLET | Freq: Two times a day (BID) | ORAL | 0 refills | Status: AC

## 2023-09-26 MED ORDER — AMOXICILLIN-POT CLAVULANATE 875-125 MG PO TABS: 1.0000 | ORAL_TABLET | Freq: Two times a day (BID) | ORAL | 0 refills | Status: DC

## 2023-09-26 NOTE — Telephone Encounter (Signed)
 Called pt he stated that he wants his Rx to be sent to [facility] this has been sent 09/26/2023

## 2023-09-26 NOTE — Telephone Encounter (Signed)
 Please let the pt know I sent in augmentin  in for him to the [facility] in [place]

## 2023-09-26 NOTE — Addendum Note (Signed)
 Addended by: Laneta Pintos on: 09/26/2023 07:16 AM   Modules accepted: Orders

## 2023-10-07 NOTE — Progress Notes (Cosign Needed)
 Neurology clinic note  SERVICE DATE: @DATE @   Reason for Evaluation:  R hand Paresthesias    HPI: This is Mr. Mitchell [name], a 63 y.o. R-handed male with a medical history of hyperlipidemia, hypertension, anxiety, prior alcohol abuse, CAD status post MI, ICM, history of TIA in 2018 (with left arm numbness, blurry vision with negative MRA-MRI except for minimal ASVD), history of "heatstroke "in 2012, history of remote TBI, prior seen with mild cognitive impairment of unclear etiology but with a psychiatric component as well as sleep dysfunction (possible OSA) per neuropsych evaluation (2023), chronic back pain with R  sciatica, arthritis of the lower lumbar area, who presents to neurology clinic with the chief complaint of paresthesias R forearm.     When did the symptoms appear? 1 year ago, with pain R  pain from the neck to the arm, " it was a pinched nerve, and the pain did not go away but got better". It was worse with movements especially turning the doorknob. . Then the pain returned about 1-2 months ago but was but it was "burning,  above the elbow extending to the palm, followed with "hot pins and needles, I even thought I burned my palm"  Numbness L arm < R arm    Tingling R arm  Itching "All over"   Painful  hot feeling L arm   Burning Sensation R arm  " a burning recently on the inside of the palm of the hand, lasted for an hour".  Shock-Like Sensation NO Increased Sense to Touch NO Muscle bulk loss?  Denies Muscle pain?  Denies Cramps/Twitching? No Does strength improve after brief exercise?No "activity makes it worse" The only think I could do is raise my arm"  Able to brush hair/teeth without difficulty? Able to button shirts/use zips? Yes "not when is hurting" Clumsiness/dropping grasped objects? Denies Can you arise from squatted position easily? Yes   Able to get out of chair without using arms? Yes  Able to walk up steps easily? yes Use an assistive device to  walk? No  Significant imbalance with walking? "Sometimes I trip over my feet. I Am clumsy now" Falls? No   Other symptoms  diplopia 2 episodes, 6 months ago and 1 week ago  I was going to stand up and got weak and I got blurred vision and  had double vision, could not control the eyes. Denies ptosis,  He has dysphagia with solids, not with liquids for 6 months ," I don't know why the food gets stuck" , poor saliva control, "I drool over my pillow for the last 6 months. Denies dysarthria/dysphonia, impaired mastication, facial weakness/droop. Sweats a lot at night , he has heat intolerance for the last few years,  denies excessive mucosal dryness, gastroparetic early satiety, postprandial abdominal bloating, constipation, he has significant diarrhea  but denies bowel or bladder dyscontrol, erectile dysfunction  or syncope/presyncope when he had the episode of double vision.  Denies  orthostatic intolerance.   Denies acute orthopnea. He has dyspnea since the heart attack 3 years ago.  Recent skin rashes? He has a change in the coloration of the legs, I itch a lot on my skin Any constitutional symptoms like fever, night sweats, anorexia or unintentional weight loss?. NO  ETOH use?: quit 2023  Restrictive diet? He eats healthy Family history of neuropathy/myopathy/NM disease?Father may have had a NM disease . Mo had PD.    Any biopsy done? no Current medications being tried for the  patient's symptoms include NO     Prior medications that have been tried:  None     MRI of the brain 05/11/2022 was negative for acute intracranial abnormalities, mild chronic small vessel ischemic changes within the cerebral white matter, minimally progressed from the prior brain MRI in August 2018, no age advanced or lobar predominant parenchymal atrophy    MEDICATIONS:  Outpatient Encounter Medications as of 10/11/2023  Medication Sig   aspirin  EC 81 MG tablet Take by mouth.   Evolocumab  (REPATHA  SURECLICK) 140  MG/ML SOAJ Inject 140 mg into the skin every 14 (fourteen) days.   ezetimibe  (ZETIA ) 10 MG tablet Take 1 tablet (10 mg total) by mouth daily.   furosemide (LASIX) 40 MG tablet Take 1 tablet (40 mg total) by mouth daily as needed.   isosorbide  mononitrate (IMDUR ) 30 MG 24 hr tablet Take 1 tablet (30 mg total) by mouth at bedtime.   losartan (COZAAR) 25 MG tablet Take 1 tablet (25 mg total) by mouth daily.   magnesium  oxide (MAG-OX) 400 (240 Mg) MG tablet Take 1 tablet (400 mg total) by mouth daily.   metoprolol  succinate (TOPROL -XL) 50 MG 24 hr tablet Take 1 tablet (50 mg total) by mouth daily. Take with or immediately following a meal.   nitroGLYCERIN  (NITROSTAT ) 0.4 MG SL tablet Place 1 tablet (0.4 mg total) under the tongue every 5 (five) minutes as needed.   traZODone  (DESYREL ) 50 MG tablet Take 1-2 tablets (50-100 mg total) by mouth at bedtime. (Patient taking differently: Take 50-100 mg by mouth at bedtime. One tablet during the day for anxiety and 1 tablet for sleep at bedtime)   [DISCONTINUED] amLODipine (NORVASC) 5 MG tablet Take 5 mg by mouth daily.   [DISCONTINUED] Evolocumab  (REPATHA  SURECLICK) 140 MG/ML SOAJ Inject 140 mg into the skin every 14 (fourteen) days.   [DISCONTINUED] isosorbide  mononitrate (IMDUR ) 30 MG 24 hr tablet Take 1 tablet (30 mg total) by mouth at bedtime.   [DISCONTINUED] metoprolol  tartrate (LOPRESSOR ) 25 MG tablet Take 1 tablet (25 mg total) by mouth 2 (two) times daily.   [DISCONTINUED] nitroGLYCERIN  (NITROSTAT ) 0.4 MG SL tablet Place 1 tablet (0.4 mg total) under the tongue every 5 (five) minutes as needed.   No facility-administered encounter medications on file as of 10/11/2023.    PAST MEDICAL HISTORY: Past Medical History:  Diagnosis Date   Alcohol dependence in sustained full remission    Sober for past 1 year (05/02/22)   Benign essential hypertension 12/07/2020   Chest pain 06/01/2017   Chronic low back pain 03/11/2014   Constipation 03/06/2014    Coronary artery disease of native artery of native heart with stable angina pectoris 03/04/2014   a. 02/2014 Inf STEMI/PCI: LM nl, LAD min irregs, Diags 1/2/3 min irregs, LCX min irregs, OM1/2/3 min irregs, RI min irregs, RCA 100p (3.5x18 Xience DES), PDA/PL nl, EF 45-50% basal inf HK.   Dizziness, nonspecific 01/10/2021   Generalized anxiety disorder 03/04/2014   History of acute inferior wall MI 02/2014   Tx with DES to RCA   History of COVID-19 06/2018   Hyperlipidemia    Intolerance of drug    dyspnea with Brilinta  >> changed to Effient     Ischemic cardiomyopathy    a. EF 45-50% with inf HK at Inf STEMI // b. Echo in 10/15 with EF 55-60% with inf and inf-lat HK   Major depressive disorder    Mild cognitive impairment of uncertain or unknown etiology 05/02/2022   NSVT (nonsustained ventricular  tachycardia)    a. in the setting of inferior STEMI;  b. 02/2014 Echo: EF 55-60%, Gr 1 DD, basal-midinferolateral/inf HK, Gr 1 DD, triv TR.   Statin intolerance 01/09/2021   Status post coronary artery stent placement    inferior STEMI 02/2014 with 100% RCA, (single vessel disease) stented with Xience DES, EF 55-60%.  Patent stent by cath 12/08/2020.   TIA (transient ischemic attack) 01/12/2017   Ventricular ectopy 12/07/2020    PAST SURGICAL HISTORY: Past Surgical History:  Procedure Laterality Date   CARDIAC CATHETERIZATION     02/28/2014 prox RCA single vessel occlusion treated with DES   CORONARY ANGIOPLASTY WITH STENT PLACEMENT     LEFT HEART CATH Bilateral 02/28/2014   Procedure: LEFT HEART CATH;  Surgeon: Wenona Hamilton, MD;  Location: [facility] CATH LAB;  Service: Cardiovascular;  Laterality: Bilateral;   TONSILLECTOMY     WRIST SURGERY      ALLERGIES: Allergies  Allergen Reactions   Ticagrelor  Shortness Of Breath   Statins Other (See Comments)    Other Reaction(s): Myalgias    FAMILY HISTORY: Family History  Problem Relation Age of Onset   Parkinson's disease Mother     Hypertension Father    Heart attack Brother 53   Dementia Maternal Grandmother    Dementia Paternal Grandmother     SOCIAL HISTORY: Social History   Tobacco Use   Smoking status: Former   Smokeless tobacco: Never  Vaping Use   Vaping status: Never Used  Substance Use Topics   Alcohol use: Not Currently    Comment: hx of significant alcohol abuse; sober since 2022.   Drug use: No   Social History   Social History Narrative   ** Merged History Encounter **    Right handed   Drinks caffeine   Single story   Divorced 23 years, one dog     OBJECTIVE: PHYSICAL EXAM: BP (!) 149/78   Pulse 70   Resp 20   Wt 192 lb (87.1 kg)   SpO2 97%   BMI 27.55 kg/m   General:   General appearance: Awake and alert. No distress. Cooperative with exam.  Skin: No obvious rash or jaundice. HEENT: Atraumatic. Anicteric. Lungs: Non-labored breathing on room air  Heart: Regular Abdomen: Soft, non tender. Extremities: B LE  edema. No obvious deformity.  Musculoskeletal: No obvious joint swelling. Psych: Affect appropriate.  Neurological: Mental Status: Alert. Speech fluent. Cranial Nerves: CNII:Visual fields grossly intact. Fundus not visualized  CNIII, IV, VI: PERRL. No nystagmus. EOMI. CN V: Facial sensation intact bilaterally to fine touch.   CN VII: Facial muscles symmetric and strong. No ptosis  CN VIII: Hearing grossly intact bilaterally. CN IX: No hypophonia. CN X: Palate elevates symmetrically. CN XI: Full strength shoulder shrug bilaterally. CN XII: Tongue protrusion full and midline. No atrophy or fasciculations. No significant dysarthria  Motor: Tone is  normal.   No fasciculations in   extremities.   No atrophy. Toe extension    Reflexes: DTR 2/4 B . Negative Babinski   Sensation: Pinprick: decreased  R  hand, normal L  Vibration:  normal  Temperature: normal  Proprioception: normal   Coordination: Intact finger-to- nose-finger bilaterally. Romberg negative.   Gait: Able to rise from chair with arms crossed unassisted. Normal, narrow-based gait. Able to tandem walk. Able to heel-toe walk  PLAN   Mitchell [name] is a 63 y.o. male who presents for evaluation of paresthesias  R arm, etiology unclear. NCS/EMG  R arm MRI brain  r/o any structural abnormalities and evaluate vascular load.   Follow up in 3 months   Total time spent reviewing records, interview, history/exam, documentation, and coordination of care on day of encounter:  45 min    CC: Laneta Pintos, MD [address] [place] [address]

## 2023-10-09 ENCOUNTER — Other Ambulatory Visit (HOSPITAL_COMMUNITY): Payer: Self-pay

## 2023-10-11 ENCOUNTER — Ambulatory Visit: Payer: Medicare PPO | Attending: Cardiovascular Disease | Admitting: Cardiovascular Disease

## 2023-10-11 ENCOUNTER — Ambulatory Visit (INDEPENDENT_AMBULATORY_CARE_PROVIDER_SITE_OTHER): Admitting: Physician Assistant

## 2023-10-11 ENCOUNTER — Encounter: Payer: Self-pay | Admitting: Physician Assistant

## 2023-10-11 ENCOUNTER — Other Ambulatory Visit: Payer: Self-pay

## 2023-10-11 ENCOUNTER — Encounter: Payer: Self-pay | Admitting: Cardiovascular Disease

## 2023-10-11 VITALS — BP 140/80 | HR 76 | Ht 70.0 in | Wt 192.0 lb

## 2023-10-11 VITALS — BP 149/78 | HR 70 | Resp 20 | Wt 192.0 lb

## 2023-10-11 DIAGNOSIS — R202 Paresthesia of skin: Secondary | ICD-10-CM | POA: Diagnosis not present

## 2023-10-11 DIAGNOSIS — E7800 Pure hypercholesterolemia, unspecified: Secondary | ICD-10-CM

## 2023-10-11 DIAGNOSIS — I5032 Chronic diastolic (congestive) heart failure: Secondary | ICD-10-CM | POA: Diagnosis not present

## 2023-10-11 DIAGNOSIS — I10 Essential (primary) hypertension: Secondary | ICD-10-CM

## 2023-10-11 DIAGNOSIS — I25118 Atherosclerotic heart disease of native coronary artery with other forms of angina pectoris: Secondary | ICD-10-CM

## 2023-10-11 MED ORDER — ISOSORBIDE MONONITRATE ER 30 MG PO TB24: 30.0000 mg | ORAL_TABLET | Freq: Every day | ORAL | 3 refills | Status: DC

## 2023-10-11 MED ORDER — METOPROLOL SUCCINATE ER 25 MG PO TB24: 25.0000 mg | ORAL_TABLET | Freq: Every day | ORAL | 3 refills | Status: DC

## 2023-10-11 MED ORDER — NITROGLYCERIN 0.4 MG SL SUBL: 0.4000 mg | SUBLINGUAL_TABLET | SUBLINGUAL | 3 refills | Status: AC | PRN

## 2023-10-11 MED ORDER — REPATHA SURECLICK 140 MG/ML ~~LOC~~ SOAJ: 1.0000 mL | SUBCUTANEOUS | 11 refills | Status: DC

## 2023-10-11 MED ORDER — FUROSEMIDE 40 MG PO TABS: 40.0000 mg | ORAL_TABLET | Freq: Every day | ORAL | 3 refills | Status: AC | PRN

## 2023-10-11 MED ORDER — METOPROLOL SUCCINATE ER 50 MG PO TB24: 50.0000 mg | ORAL_TABLET | Freq: Every day | ORAL | 3 refills | Status: AC

## 2023-10-11 MED ORDER — LOSARTAN POTASSIUM 25 MG PO TABS: 25.0000 mg | ORAL_TABLET | Freq: Every day | ORAL | 3 refills | Status: DC

## 2023-10-11 NOTE — Progress Notes (Signed)
 Chief Complaint  Patient presents with   Follow-up    CAD   History of Present Illness: 63 yo male with history of CAD, prior alcohol abuse, HTN, hyperlipidemia, anxiety, depression, TIA, PVCs and NSVT who is here today for follow up. I saw him as a new patient to re-establish care in our office in March 2024. He had been followed in our group by Dr. Mitzie Anda and then was seen in 2019 by Dr. Alvis Ba. 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 A drug eluting stent was placed in the RCA. Echo in 2015 with LVEF=55-60%. He was admitted to [facility] in 2019 and was seen then by Dr. Alvis Ba for evaluation of chest pain in setting of normal troponin. Nuclear stress test without ischemia but evidence of piror inferior infarct. He was admitted to [facility] in July 2022 with chest pain. Echo July 2022 with LVEF=55-60%. Cardiac cath July 2022 at [facility] with mild disease in the LAD and Circumflex and patent RCA stent. Of note, he has not tolerated statins and had dyspnea with Brilinta . He was seen in our office in October 2024 with elevated BP and LE edema. Due to financial restraints, he had been out of his medications.   He is here today for follow up. He has LE edema and dyspnea. Some chest pain. He has not been following a salt restricted diet. He has been out Imdur  for a month. No palpitations, orthopnea, PND, dizziness, near syncope or syncope.   Primary Care Physician: Laneta Pintos, MD   Past Medical History:  Diagnosis Date   Alcohol dependence in sustained full remission    Sober for past 1 year (05/02/22)   Benign essential hypertension 12/07/2020   Chest pain 06/01/2017   Chronic low back pain 03/11/2014   Constipation 03/06/2014   Coronary artery disease of native artery of native heart with stable angina pectoris 03/04/2014   a. 02/2014 Inf STEMI/PCI: LM nl, LAD min irregs, Diags 1/2/3 min irregs, LCX min irregs, OM1/2/3 min irregs, RI min irregs,  RCA 100p (3.5x18 Xience DES), PDA/PL nl, EF 45-50% basal inf HK.   Dizziness, nonspecific 01/10/2021   Generalized anxiety disorder 03/04/2014   History of acute inferior wall MI 02/2014   Tx with DES to RCA   History of COVID-19 06/2018   Hyperlipidemia    Intolerance of drug    dyspnea with Brilinta  >> changed to Effient     Ischemic cardiomyopathy    a. EF 45-50% with inf HK at Inf STEMI // b. Echo in 10/15 with EF 55-60% with inf and inf-lat HK   Major depressive disorder    Mild cognitive impairment of uncertain or unknown etiology 05/02/2022   NSVT (nonsustained ventricular tachycardia)    a. in the setting of inferior STEMI;  b. 02/2014 Echo: EF 55-60%, Gr 1 DD, basal-midinferolateral/inf HK, Gr 1 DD, triv TR.   Statin intolerance 01/09/2021   Status post coronary artery stent placement    inferior STEMI 02/2014 with 100% RCA, (single vessel disease) stented with Xience DES, EF 55-60%.  Patent stent by cath 12/08/2020.   TIA (transient ischemic attack) 01/12/2017   Ventricular ectopy 12/07/2020    Past Surgical History:  Procedure Laterality Date   CARDIAC CATHETERIZATION     02/28/2014 prox RCA single vessel occlusion treated with DES   CORONARY ANGIOPLASTY WITH STENT PLACEMENT     LEFT HEART CATH Bilateral 02/28/2014   Procedure: LEFT HEART  CATH;  Surgeon: Wenona Hamilton, MD;  Location: [facility] CATH LAB;  Service: Cardiovascular;  Laterality: Bilateral;   TONSILLECTOMY     WRIST SURGERY      Current Outpatient Medications  Medication Sig Dispense Refill   aspirin  EC 81 MG tablet Take by mouth.     ezetimibe  (ZETIA ) 10 MG tablet Take 1 tablet (10 mg total) by mouth daily. 90 tablet 3   furosemide (LASIX) 40 MG tablet Take 1 tablet (40 mg total) by mouth daily as needed. 90 tablet 3   losartan (COZAAR) 25 MG tablet Take 1 tablet (25 mg total) by mouth daily. 90 tablet 3   magnesium  oxide (MAG-OX) 400 (240 Mg) MG tablet Take 1 tablet (400 mg total) by mouth daily. 90 tablet 2    metoprolol  succinate (TOPROL -XL) 50 MG 24 hr tablet Take 1 tablet (50 mg total) by mouth daily. Take with or immediately following a meal. 90 tablet 3   traZODone  (DESYREL ) 50 MG tablet Take 1-2 tablets (50-100 mg total) by mouth at bedtime. (Patient taking differently: Take 50-100 mg by mouth at bedtime. One tablet during the day for anxiety and 1 tablet for sleep at bedtime) 180 tablet 0   Evolocumab  (REPATHA  SURECLICK) 140 MG/ML SOAJ Inject 140 mg into the skin every 14 (fourteen) days. 2 mL 11   isosorbide  mononitrate (IMDUR ) 30 MG 24 hr tablet Take 1 tablet (30 mg total) by mouth at bedtime. 90 tablet 3   nitroGLYCERIN  (NITROSTAT ) 0.4 MG SL tablet Place 1 tablet (0.4 mg total) under the tongue every 5 (five) minutes as needed. 25 tablet 3   No current facility-administered medications for this visit.    Allergies  Allergen Reactions   Ticagrelor  Shortness Of Breath   Statins Other (See Comments)    Other Reaction(s): Myalgias    Social History   Socioeconomic History   Marital status: Divorced    Spouse name: Not on file   Number of children: 2   Years of education: 9   Highest education level: 9th grade  Occupational History   Occupation: Disability    Comment: telephone lineman  Tobacco Use   Smoking status: Former   Smokeless tobacco: Never  Vaping Use   Vaping status: Never Used  Substance and Sexual Activity   Alcohol use: Not Currently    Comment: hx of significant alcohol abuse; sober since 2022.   Drug use: No   Sexual activity: Not Currently  Other Topics Concern   Not on file  Social History Narrative   ** Merged History Encounter **    Right handed   Drinks caffeine   Single story   Divorced 23 years, one dog   Social Drivers of Health   Financial Resource Strain: High Risk (09/04/2023)   Overall Financial Resource Strain (CARDIA)    Difficulty of Paying Living Expenses: Very hard  Food Insecurity: Food Insecurity Present (03/09/2023)   Hunger  Vital Sign    Worried About Running Out of Food in the Last Year: Sometimes true    Ran Out of Food in the Last Year: Sometimes true  Transportation Needs: Unmet Transportation Needs (03/09/2023)   PRAPARE - Administrator, Civil Service (Medical): Yes    Lack of Transportation (Non-Medical): Yes  Physical Activity: Not on file  Stress: Not on file  Social Connections: Not on file  Intimate Partner Violence: Not on file    Family History  Problem Relation Age of Onset   Parkinson's disease  Mother    Hypertension Father    Heart attack Brother 15   Dementia Maternal Grandmother    Dementia Paternal Grandmother     Review of Systems:  As stated in the HPI and otherwise negative.   BP (!) 140/80   Pulse 76   Ht 5\' 10"  (1.778 m)   Wt 192 lb (87.1 kg)   SpO2 98%   BMI 27.55 kg/m   Physical Examination: General: Well developed, well nourished, NAD  HEENT: OP clear, mucus membranes moist  SKIN: warm, dry. No rashes. Neuro: No focal deficits  Musculoskeletal: Muscle strength 5/5 all ext  Psychiatric: Mood and affect normal  Neck: No JVD, no carotid bruits, no thyromegaly, no lymphadenopathy.  Lungs:Clear bilaterally, no wheezes, rhonci, crackles Cardiovascular: Regular rate and rhythm. No murmurs, gallops or rubs. Abdomen:Soft. Bowel sounds present. Non-tender.  Extremities: No lower extremity edema. Pulses are 2 + in the bilateral DP/PT.  EKG:  EKG is not ordered today. The ekg ordered today demonstrates   Recent Labs: 07/26/2023: ALT 111; BUN 14; Creatinine, Ser 0.93; Hemoglobin 14.7; Platelets 210; Potassium 4.3; Sodium 141   Lipid Panel    Component Value Date/Time   CHOL 253 (H) 03/09/2023 1036   TRIG 81 03/09/2023 1036   HDL 68 03/09/2023 1036   CHOLHDL 3.7 03/09/2023 1036   CHOLHDL 3.9 06/02/2017 0535   VLDL 43 (H) 06/02/2017 0535   LDLCALC 171 (H) 03/09/2023 1036   Wt Readings from Last 3 Encounters:  10/11/23 192 lb (87.1 kg)  09/03/23 186 lb  (84.4 kg)  07/26/23 186 lb 12.8 oz (84.7 kg)    Assessment and Plan:   1. CAD with angina: No change in chronic chest pain. He has been off of Imdur  for a month. Continue ASA, Toprol , Imdur  and Zetia . He is statin intolerant   2. HTN: BP is controlled. Will stop Norvasc due to LE edema. Start Losartan 25 mg po daily. Change metoprolol  to Toprol  50 mg daily. BMET today.   3. Hyperlipidemia: Goal LDL under 70. He is intolerant of statins. He has been on Repatha  and Zetia  for several months. Will continue Repatha  and Zetia  and plan lipids and LFTs today.    4. Chronic diastolic CHF: His weight is up a few pounds. He has mild LE edema. Will stop Norvasc. Will give him Lasix 40 mg to use as needed. I have advised him to cut back on his sodium intake. Elevate legs when sitting.   Labs/ tests ordered today include:   Orders Placed This Encounter  Procedures   Lipid panel   Basic metabolic panel with GFR   Hepatic function panel   Disposition:   F/U with me in 12 months  Signed, Antoinette Batman, MD, [facility] 10/11/2023 12:12 PM    [facility] [address] [place], [place], [place]  [address] Phone: [phone]; Fax: [phone]

## 2023-10-11 NOTE — Patient Instructions (Addendum)
 Medication Instructions:  Your physician has recommended you make the following change in your medication:  1.) stop amlodipine 2.) stop metoprolol  tartrate 3.) start losartan 25 mg - take one tablet daily 4.) start metoprolol  succinate (Toprol  XL) 50 mg - take one tablet daily 5.) start furosemide (Lasix) 40 mg - take one tablet daily as needed for swelling  *If you need a refill on your cardiac medications before your next appointment, please call your pharmacy*  Lab Work: Today: first floor lab for blood work (lipids, liver function and basic metabolic panel)  Testing/Procedures: none  Follow-Up: At [facility], you and your health needs are our priority.  As part of our continuing mission to provide you with exceptional heart care, our providers are all part of one team.  This team includes your primary Cardiologist (physician) and Advanced Practice Providers or APPs (Physician Assistants and Nurse Practitioners) who all work together to provide you with the care you need, when you need it.  Your next appointment:   6 month(s)  Provider:   Callie Goodrich, PA-C, Theotis Flake, PA-C, or Marlyse Single, PA-C

## 2023-10-12 ENCOUNTER — Ambulatory Visit: Payer: Self-pay | Admitting: Cardiovascular Disease

## 2023-10-12 LAB — BASIC METABOLIC PANEL WITH GFR
BUN/Creatinine Ratio: 28 — ABNORMAL HIGH (ref 10–24)
BUN: 25 mg/dL (ref 8–27)
CO2: 18 mmol/L — ABNORMAL LOW (ref 20–29)
Calcium: 9.3 mg/dL (ref 8.6–10.2)
Chloride: 104 mmol/L (ref 96–106)
Creatinine, Ser: 0.9 mg/dL (ref 0.76–1.27)
Glucose: 92 mg/dL (ref 70–99)
Potassium: 4.5 mmol/L (ref 3.5–5.2)
Sodium: 142 mmol/L (ref 134–144)
eGFR: 96 mL/min/{1.73_m2} (ref >59)

## 2023-10-12 LAB — HEPATIC FUNCTION PANEL
ALT: 11 IU/L (ref 0–44)
AST: 19 IU/L (ref 0–40)
Albumin: 4.6 g/dL (ref 3.9–4.9)
Alkaline Phosphatase: 140 IU/L — ABNORMAL HIGH (ref 44–121)
Bilirubin Total: 0.3 mg/dL (ref 0.0–1.2)
Bilirubin, Direct: 0.14 mg/dL (ref 0.00–0.40)
Total Protein: 7.5 g/dL (ref 6.0–8.5)

## 2023-10-12 LAB — LIPID PANEL
Chol/HDL Ratio: 2.7 ratio (ref 0.0–5.0)
Cholesterol, Total: 141 mg/dL (ref 100–199)
HDL: 53 mg/dL (ref >39)
LDL Chol Calc (NIH): 62 mg/dL (ref 0–99)
Triglycerides: 151 mg/dL — ABNORMAL HIGH (ref 0–149)
VLDL Cholesterol Cal: 26 mg/dL (ref 5–40)

## 2023-10-23 ENCOUNTER — Other Ambulatory Visit (HOSPITAL_COMMUNITY): Payer: Self-pay

## 2023-10-23 ENCOUNTER — Telehealth: Payer: Self-pay | Admitting: Cardiovascular Disease

## 2023-10-23 MED ORDER — REPATHA SURECLICK 140 MG/ML ~~LOC~~ SOAJ
1.0000 mL | SUBCUTANEOUS | 3 refills | Status: AC
  Filled 2023-10-23 – 2023-11-14 (×2): qty 6, 84d supply, fill #0
  Filled 2024-01-27: qty 6, 84d supply, fill #1
  Filled 2024-04-16: qty 6, 84d supply, fill #2

## 2023-10-23 NOTE — Telephone Encounter (Signed)
 Pt c/o medication issue:  1. Name of Medication: Evolocumab  (REPATHA  SURECLICK) 140 MG/ML SOAJ   2. How are you currently taking this medication (dosage and times per day)? As written   3. Are you having a reaction (difficulty breathing--STAT)? No   4. What is your medication issue? Pt called in stating [facility] told him he has to pay $140 copay in order for this medication and all the other ones to be mailed off. He states he usually gets Repatha  at [facility] pharmacy because it is free. Please send this medication to [facility] and cancel request with [facility].    [facility] Phone: [phone]  Fax: [phone]

## 2023-10-23 NOTE — Telephone Encounter (Signed)
 Rx sent.

## 2023-11-13 ENCOUNTER — Encounter: Payer: Self-pay | Admitting: Physician Assistant

## 2023-11-14 ENCOUNTER — Other Ambulatory Visit (HOSPITAL_COMMUNITY): Payer: Self-pay

## 2023-11-14 ENCOUNTER — Other Ambulatory Visit: Payer: Self-pay

## 2023-11-19 ENCOUNTER — Encounter: Payer: Self-pay | Admitting: Neurology

## 2023-11-19 ENCOUNTER — Ambulatory Visit: Admitting: Neurology

## 2023-11-19 DIAGNOSIS — Z029 Encounter for administrative examinations, unspecified: Secondary | ICD-10-CM

## 2023-12-03 ENCOUNTER — Encounter: Payer: Self-pay | Admitting: Family Medicine

## 2023-12-03 ENCOUNTER — Ambulatory Visit (INDEPENDENT_AMBULATORY_CARE_PROVIDER_SITE_OTHER): Admitting: Family Medicine

## 2023-12-03 VITALS — BP 175/110 | HR 77 | Ht 70.0 in | Wt 188.8 lb

## 2023-12-03 DIAGNOSIS — M5441 Lumbago with sciatica, right side: Secondary | ICD-10-CM

## 2023-12-03 DIAGNOSIS — E7800 Pure hypercholesterolemia, unspecified: Secondary | ICD-10-CM

## 2023-12-03 DIAGNOSIS — F331 Major depressive disorder, recurrent, moderate: Secondary | ICD-10-CM | POA: Diagnosis not present

## 2023-12-03 DIAGNOSIS — G8929 Other chronic pain: Secondary | ICD-10-CM

## 2023-12-03 DIAGNOSIS — I10 Essential (primary) hypertension: Secondary | ICD-10-CM

## 2023-12-03 DIAGNOSIS — J302 Other seasonal allergic rhinitis: Secondary | ICD-10-CM

## 2023-12-03 DIAGNOSIS — Z5941 Food insecurity: Secondary | ICD-10-CM

## 2023-12-03 MED ORDER — LOSARTAN POTASSIUM 50 MG PO TABS: 50.0000 mg | ORAL_TABLET | Freq: Every day | ORAL | 2 refills | Status: AC

## 2023-12-03 NOTE — Progress Notes (Unsigned)
 Established Patient Office Visit  Subjective   Patient ID: CONRAD ZAJKOWSKI, male    DOB: 01-26-1961  Age: 63 y.o. MRN: [id number]  Chief Complaint  Patient presents with   Medical Management of Chronic Issues    HPI  Subjective - Reports generalized pruritus, severe enough to require using a kitchen tool to scratch. This occurs in the house. - Also reports watery, itchy eyes. - Experiences nasal congestion, unable to breathe through the nose when lying on one side. This affects sleep. - Reports constant anxiety for a long time. Had a prior trial of medication from a psychologist (not trazodone ) which made him feel weird and was stopped. Currently prescribed trazodone  but rarely takes it. Manages anxiety by trying to calm himself, listening to music, or watching westerns. Has an upcoming psychology appointment. - Reports constipation, previously having bowel movements 1-2 times per week with hard stools, sometimes looking for blood. Now has daily soft bowel movements since starting magnesium . - Reports sciatica, with pain radiating from the right buttock down the leg. Pain is so severe it has caused him to crawl to the bathroom. Tried to walk his usual route this morning but had to turn back after 300 yards due to severe back pain. Was previously advised to do yoga, but states this can trigger the pain. Reports a history of carrying a 70-pound ladder on his right shoulder for decades for work. An X-ray a few years ago showed normal age-related compression of the lower spine per a neurologist. - Reports feeling depressed and isolated, though having a new dog helps. - Reports financial difficulty, particularly with the cost of food, though his brother has been helping. Unable to afford healthy foods like fruits. - A1c is fine. - Cholesterol has been cut in half. - Blood pressure is elevated in the office, attributes it to driving. Does not know home blood pressure readings as his monitor  needs new batteries. - Reports previous lower extremity edema, which has resolved since the cardiologist switched him from amlodipine to a diuretic. Ankles are no longer swollen.  Medications Current medications include metoprolol , losartan  25 mg, isosorbide  mononitrate for angina, a diuretic (name not recalled), trazodone  prn, and Repatha . Reports Repatha  is obtained via a grant through [employer], and other medications through [facility].  PMH, PSH, FH, Social Hx PMHx: Anxiety, depression, hypertension, angina, hyperlipidemia, constipation, sciatica, history of lower extremity edema. PSH: None mentioned. FH: None mentioned. Social Hx: Lives alone, feels isolated. Has a new dog. Receives some financial assistance from his brother. Has food insecurity. Has an upcoming appointment with a social worker regarding community resources.  ROS Eyes: Watery, itchy. ENT: Nasal congestion. CV: Denies chest pain. Reports elevated BP readings in office. MSK: Reports severe right-sided sciatica with radiation down the leg. Neuro: Reports sciatica. Psych: Reports anxiety and depression.  Objective General: Appears in a better mood.  Assessment and Plan Allergies/Rhinitis: Presents with generalized pruritus, watery/itchy eyes, and nasal congestion suggestive of allergic rhinitis. No specific triggers identified. - Start Claritin (or generic loratadine) one tab daily. - Start Flonase (or generic fluticasone) nasal spray, one spray in each nostril daily. Advised to take daily for at least one month. - Advised that oral antihistamines should be sufficient for eye symptoms. - Follow up in one month if no improvement.  Hypertension: History of HTN, currently on losartan  25mg  daily. BP is elevated in office. Previous trial of amlodipine was stopped due to lower extremity edema. - Increase losartan  to 50 mg daily. -  Advised to take two of the 25 mg tablets until the current supply is finished. - Will send in  a new prescription for the 50 mg dose. - Rechecked BP before leaving.  Sciatica: Reports severe, chronic right-sided sciatica with pain radiating down the posterior leg, limiting ambulation. Previous imaging showed mild degenerative changes. - Provided with exercises and mild stretches to perform at home. - Discussed referral to physical therapy for targeted exercises.  Anxiety/Depression: Longstanding history of anxiety and depression. Has an upcoming appointment with his psychologist. Reports feeling isolated. - Continue current management. - Encouraged to keep psychology appointment.  Health Maintenance: Inquires about taking a multivitamin. Bowel movements have improved with magnesium . Labs are recent and do not need to be repeated. Cholesterol is significantly improved on Repatha . - Advised that taking a generic daily multivitamin, such as one for seniors, is fine.  Social Determinants of Health: Reports food insecurity and financial strain. - Has been connected with a Child psychotherapist for community resources.    The ASCVD Risk score (Arnett DK, et al., 2019) failed to calculate for the following reasons:   Risk score cannot be calculated because patient has a medical history suggesting prior/existing ASCVD  Health Maintenance Due  Topic Date Due   Medicare Annual Wellness (AWV)  Never done   Hepatitis C Screening  Never done   COVID-19 Vaccine (1 - 2024-25 season) Never done      Objective:     BP (!) 161/95   Pulse 77   Ht 5' 10 (1.778 m)   Wt 188 lb 12.8 oz (85.6 kg)   SpO2 97%   BMI 27.09 kg/m  {Vitals History (Optional):23777}  Physical Exam   No results found for any visits on 12/03/23.      Assessment & Plan:   There are no diagnoses linked to this encounter.   No follow-ups on file.    Toribio MARLA Slain, MD

## 2023-12-03 NOTE — Patient Instructions (Addendum)
 It was nice to see you today,  We addressed the following topics today: -I am increasing your losartan  from 25 to 50 mg.  Until you get the new prescription you can take 2 tablets of your old 25 mg dose. - Take generic Claritin once daily and generic Flonase once daily (2 sprays each nostril) for your allergy symptoms.  Try for at least a month before you decide if it is not working - I have given you some exercises you can try at home for your sciatic pain. - Try to take your blood pressure at home your goal should be less than 140/85 on average.  Have a great day,  Rolan Slain, MD

## 2023-12-04 DIAGNOSIS — J302 Other seasonal allergic rhinitis: Secondary | ICD-10-CM | POA: Insufficient documentation

## 2023-12-04 NOTE — Assessment & Plan Note (Signed)
 History of HTN, currently on losartan  25mg  daily. BP is elevated in office. Previous trial of amlodipine was stopped due to lower extremity edema. - Increase losartan  to 50 mg daily. - Advised to take two of the 25 mg tablets until the current supply is finished. - Will send in a new prescription for the 50 mg dose. - Rechecked BP before leaving.

## 2023-12-04 NOTE — Assessment & Plan Note (Signed)
 Pt's brother now helping him with this.  Reports it is less of a concern now

## 2023-12-04 NOTE — Assessment & Plan Note (Signed)
 Now taking repatha  in addition to zetia .  Gets it at no cost to him through [facility]

## 2023-12-04 NOTE — Assessment & Plan Note (Signed)
 Reports severe, chronic right-sided sciatica with pain radiating down the posterior leg, limiting ambulation. Previous imaging showed mild degenerative changes. - Provided with exercises and mild stretches to perform at home. - Discussed referral to physical therapy for targeted exercises.

## 2023-12-04 NOTE — Assessment & Plan Note (Signed)
 Longstanding history of anxiety and depression. Has an upcoming appointment with his psychologist. Reports feeling isolated. - Continue current management. - Encouraged to keep psychology appointment.

## 2023-12-04 NOTE — Assessment & Plan Note (Signed)
 Presents with generalized pruritus, watery/itchy eyes, and nasal congestion suggestive of allergic rhinitis. No specific triggers identified. - Start Claritin (or generic loratadine) one tab daily. - Start Flonase (or generic fluticasone) nasal spray, one spray in each nostril daily. Advised to take daily for at least one month. - Advised that oral antihistamines should be sufficient for eye symptoms. - Follow up in one month if no improvement.

## 2023-12-12 ENCOUNTER — Encounter: Payer: Self-pay | Admitting: Physician Assistant

## 2023-12-12 ENCOUNTER — Ambulatory Visit (INDEPENDENT_AMBULATORY_CARE_PROVIDER_SITE_OTHER): Admitting: Physician Assistant

## 2023-12-12 VITALS — BP 170/102 | Resp 20 | Ht 70.0 in | Wt 188.0 lb

## 2023-12-12 DIAGNOSIS — R202 Paresthesia of skin: Secondary | ICD-10-CM | POA: Diagnosis not present

## 2023-12-12 NOTE — Progress Notes (Signed)
 Neurology clinic note   Follow up visit:  R hand Paresthesias   Update:  He denies any new issues he continues to have intermittent paresthesias of the right and an arm but no recurrence of the major event as described in May 2025.  Patient was supposed to have a EMG-NCS studies and MRI of the brain.  He reports that he was on his way to do the EMG and the car broke down, I could not make it on time and I never rescheduled for the MRI of the brain, he reported financial difficulties, which did not allow him to proceed with the films.     Any changes?  Comes and goes, twice in the past year., not as severe as last time .  He continues to have numbness in the left arm greater than the right arm, with tingling in the right arm and itching all over . No further episodes of painful sensation, feeling on the left arm. She still has a burning sensation on the right arm. Shocklike sensation?  No Increased since discharge?  No Muscle bulk loss?  Denies Muscle pain?  Denies Cramps-twitching?  No This activity make it worse or better?  Activity makes it worse, the only thing that he I can do is raise my arm to make it feel better  Able to brush hair and teeth without difficulty?  Yes  able to button shirts?  Yes, when is not hurting Clumsiness yes dropping grasp of objects?  No Continued right from squatted position easily?  Yes When he get out of the chair without using the arms?  Yes Able to walk up steps easily?  Yes Uses an assistive device to walk?  No Significant imbalance with walking?  Sometimes I tripped over my seat, I am clumsy . Any falls or head injuries?  No    Other symptoms  diplopia 2 episodes, 1 year ago months ago and 3 months ago I was going to stand up and got weak and I got blurred vision  could not control the eyes.  Denies to ptosis but he has twitching in his left eye.  He has dysphagia with solids, not liquids for the last year.  I do not know why the food  gets stuck.  He has poor saliva control, drools over the pillow for the last 9 months.  He denies dysarthria, dysphonia, impaired mastication, facial weakness or droop.   He reports night sweats, heat intolerance for a few years, denies excessive mucosal dryness, early satiety, postprandial abdominal bloating, constipation.  He has intermittent diarrhea.  He denies bowel accidents.  He does report bladder dyscontrol I had a couple of episodes where I had an accident .  He has a few episodes of night erection.  Denies any syncope or presyncope.  Denies orthostatic intolerance.  His blood pressure in fact is quite high despite multiple agents. Denies acute orthopnea.  He has dyspnea since his MI 3-1/2 years ago He reports a change in coloration of the legs, with significant itching in the skin Denies fever, anorexia, or unintentional weight loss. Denies any alcohol He eats healthy  Initial visit 10/11/2023 this is Mitchell [name], a 63 y.o. R-handed male with a medical history of hyperlipidemia, hypertension, anxiety, prior alcohol abuse, CAD status post MI, ICM, history of TIA in 2018 (with left arm numbness, blurry vision with negative MRA-MRI except for minimal ASVD), history of heatstroke in 2012, history of remote TBI, prior seen with mild  cognitive impairment of unclear etiology but with a psychiatric component as well as sleep dysfunction (possible OSA) per neuropsych evaluation (2023), chronic back pain with R  sciatica, arthritis of the lower lumbar area, who presents to neurology clinic with the chief complaint of paresthesias R forearm.     When did the symptoms appear? 1 year ago, with pain R  pain from the neck to the arm,  it was a pinched nerve, and the pain did not go away but got better. It was worse with movements especially turning the doorknob. . Then the pain returned about 1-2 months ago but was but it was burning,  above the elbow extending to the palm, followed with hot  pins and needles, I even thought I burned my palm  Numbness L arm < R arm    Tingling R arm  Itching All over   Painful  hot feeling L arm   Burning Sensation R arm   a burning recently on the inside of the palm of the hand, lasted for an hour.  Shock-Like Sensation NO Increased Sense to Touch NO Muscle bulk loss?  Denies Muscle pain?  Denies Cramps/Twitching? No Does strength improve after brief exercise?No activity makes it worse The only think I could do is raise my arm  Able to brush hair/teeth without difficulty? Able to button shirts/use zips? Yes not when is hurting Clumsiness/dropping grasped objects? Denies Can you arise from squatted position easily? Yes   Able to get out of chair without using arms? Yes  Able to walk up steps easily? yes Use an assistive device to walk? No  Significant imbalance with walking? Sometimes I trip over my feet. I Am clumsy now Falls? No   Other symptoms  everything blurry 2 episodes, 6 months ago and 1 week ago  I was going to stand up and got weak and I got blurred vision and  could not control the eyes. Denies ptosis,  He has dysphagia with solids, not with liquids for 6 months , I don't know why the food gets stuck , poor saliva control, I drool over my pillow for the last 6 months. Denies dysarthria/dysphonia, impaired mastication, facial weakness/droop. Sweats a lot at night , he has heat intolerance for the last few years,  denies excessive mucosal dryness, gastroparetic early satiety, postprandial abdominal bloating, constipation, he has significant diarrhea  but denies bowel or bladder dyscontrol, erectile dysfunction  or syncope/presyncope when he had the episode of blurred vision.  Denies  orthostatic intolerance.   Denies acute orthopnea. He has dyspnea since the heart attack 3 years ago.  Recent skin rashes? He has a change in the coloration of the legs, I itch a lot on my skin Any constitutional symptoms like fever, night  sweats, anorexia or unintentional weight loss?. NO  ETOH use?: quit 2023  Restrictive diet? He eats healthy Family history of neuropathy/myopathy/NM disease?Father may have had a NM disease . Mo had PD.    Any biopsy done? no Current medications being tried for the patient's symptoms include NO     Prior medications that have been tried:  None     MRI of the brain 05/11/2022 was negative for acute intracranial abnormalities, mild chronic small vessel ischemic changes within the cerebral white matter, minimally progressed from the prior brain MRI in August 2018, no age advanced or lobar predominant parenchymal atrophy    MEDICATIONS:  Outpatient Encounter Medications as of 12/12/2023  Medication Sig   aspirin  EC 81 MG  tablet Take by mouth.   Evolocumab  (REPATHA  SURECLICK) 140 MG/ML SOAJ Inject 140 mg into the skin every 14 (fourteen) days.   ezetimibe  (ZETIA ) 10 MG tablet Take 1 tablet (10 mg total) by mouth daily.   furosemide  (LASIX ) 40 MG tablet Take 1 tablet (40 mg total) by mouth daily as needed.   isosorbide  mononitrate (IMDUR ) 30 MG 24 hr tablet Take 1 tablet (30 mg total) by mouth at bedtime.   losartan  (COZAAR ) 50 MG tablet Take 1 tablet (50 mg total) by mouth daily.   magnesium  oxide (MAG-OX) 400 (240 Mg) MG tablet Take 1 tablet (400 mg total) by mouth daily.   metoprolol  succinate (TOPROL -XL) 50 MG 24 hr tablet Take 1 tablet (50 mg total) by mouth daily. Take with or immediately following a meal.   nitroGLYCERIN  (NITROSTAT ) 0.4 MG SL tablet Place 1 tablet (0.4 mg total) under the tongue every 5 (five) minutes as needed.   traZODone  (DESYREL ) 50 MG tablet Take 1-2 tablets (50-100 mg total) by mouth at bedtime. (Patient taking differently: Take 50-100 mg by mouth at bedtime. One tablet during the day for anxiety and 1 tablet for sleep at bedtime)   No facility-administered encounter medications on file as of 12/12/2023.    PAST MEDICAL HISTORY: Past Medical History:  Diagnosis  Date   Alcohol dependence in sustained full remission    Sober for past 1 year (05/02/22)   Benign essential hypertension 12/07/2020   Chest pain 06/01/2017   Chronic low back pain 03/11/2014   Constipation 03/06/2014   Coronary artery disease of native artery of native heart with stable angina pectoris 03/04/2014   a. 02/2014 Inf STEMI/PCI: LM nl, LAD min irregs, Diags 1/2/3 min irregs, LCX min irregs, OM1/2/3 min irregs, RI min irregs, RCA 100p (3.5x18 Xience DES), PDA/PL nl, EF 45-50% basal inf HK.   Dizziness, nonspecific 01/10/2021   Generalized anxiety disorder 03/04/2014   History of acute inferior wall MI 02/2014   Tx with DES to RCA   History of COVID-19 06/2018   Hyperlipidemia    Intolerance of drug    dyspnea with Brilinta  >> changed to Effient     Ischemic cardiomyopathy    a. EF 45-50% with inf HK at Inf STEMI // b. Echo in 10/15 with EF 55-60% with inf and inf-lat HK   Major depressive disorder    Mild cognitive impairment of uncertain or unknown etiology 05/02/2022   NSVT (nonsustained ventricular tachycardia)    a. in the setting of inferior STEMI;  b. 02/2014 Echo: EF 55-60%, Gr 1 DD, basal-midinferolateral/inf HK, Gr 1 DD, triv TR.   Statin intolerance 01/09/2021   Status post coronary artery stent placement    inferior STEMI 02/2014 with 100% RCA, (single vessel disease) stented with Xience DES, EF 55-60%.  Patent stent by cath 12/08/2020.   TIA (transient ischemic attack) 01/12/2017   Ventricular ectopy 12/07/2020    PAST SURGICAL HISTORY: Past Surgical History:  Procedure Laterality Date   CARDIAC CATHETERIZATION     02/28/2014 prox RCA single vessel occlusion treated with DES   CORONARY ANGIOPLASTY WITH STENT PLACEMENT     LEFT HEART CATH Bilateral 02/28/2014   Procedure: LEFT HEART CATH;  Surgeon: Deatrice DELENA Cage, MD;  Location: [facility] CATH [facility];  Service: Cardiovascular;  Laterality: Bilateral;   TONSILLECTOMY     WRIST SURGERY      ALLERGIES: Allergies   Allergen Reactions   Ticagrelor  Shortness Of Breath   Statins Other (See Comments)  Other Reaction(s): Myalgias    FAMILY HISTORY: Family History  Problem Relation Age of Onset   Parkinson's disease Mother    Hypertension Father    Heart attack Brother 60   Dementia Maternal Grandmother    Dementia Paternal Grandmother     SOCIAL HISTORY: Social History   Tobacco Use   Smoking status: Former   Smokeless tobacco: Never  Vaping Use   Vaping status: Never Used  Substance Use Topics   Alcohol use: Not Currently    Comment: hx of significant alcohol abuse; sober since 2022.   Drug use: No   Social History   Social History Narrative   ** Merged History Encounter **    Right handed   Drinks caffeine   Single story   Divorced 23 years, one dog     OBJECTIVE: PHYSICAL EXAM: BP (!) 170/102   Resp 20   Ht 5' 10 (1.778 m)   Wt 188 lb (85.3 kg)   SpO2 96%   BMI 26.98 kg/m   General:   General appearance: Awake and alert. No distress. Cooperative with exam.  Skin: No obvious rash or jaundice. HEENT: Atraumatic. Anicteric. Lungs: Non-labored breathing on room air  Heart: Regular Abdomen: Soft, non tender. Extremities: No edema.  No obvious deformity.  Musculoskeletal: No obvious joint swelling. Psych: Affect appropriate.  Neurological: Mental Status: Alert. Speech fluent. Cranial Nerves: CNII:Visual fields grossly intact.  No twitching during the examination.  Fundus not visualized  CNIII, IV, VI: PERRL. No nystagmus. EOMI. CN V: Facial sensation intact bilaterally to fine touch.   CN VII: Facial muscles symmetric and strong. No ptosis  CN VIII: Hearing grossly intact bilaterally. CN IX: No hypophonia. CN X: Palate elevates symmetrically. CN XI: Full strength shoulder shrug bilaterally. CN XII: Tongue protrusion full and midline. No atrophy or fasciculations. No significant dysarthria  Motor: Tone is  normal.   No fasciculations in   extremities.    No atrophy. Toe extension normal  Reflexes: DTR 2/4 B . Negative Babinski   Sensation: Pinprick: Decreased right hand, normal left.    Vibration:  normal  Temperature: normal  Proprioception: normal   Coordination: Intact FTN bilaterally.  Romberg negative.  Gait: Able to rise from chair with arms crossed unassisted. Normal, narrow-based gait. Able to tandem walk. Able to heel-toe walk  PLAN     Mitchell [name] is a 63 y.o. male presenting in follow-up after being seen for paresthesias in the right arm on 10/11/2023, etiology who presents for evaluation of paresthesias  R arm, etiology unclear.  Unfortunately, the patient did not show up for his appointment with Dr. Leigh to proceed with NCV with EMG.  He is yet to have his MRI of the brain, we explained that without these testing, we are unable to treat or determine the cause of his main issues.  He reported that there is a cost issue and that he is to contact home financial services in hopes that they can help so that he can take care of his health.  Will reschedule and we will set up another appointment for result discussion, treatment options if needed.  NCS/EMG  R arm  MRI brain:  Follow up in   Total time spent reviewing records, interview, history/exam, documentation, and coordination of care on day of encounter:  45 min    CC: Chandra Toribio POUR, MD [address] [place] [address]

## 2023-12-12 NOTE — Patient Instructions (Addendum)
 Proceed with the conduction studies in 2 months per patient's request  Proceed with MRI brain as soon as you can [phone] [facility] [employer] [facility] [phone]

## 2023-12-14 ENCOUNTER — Other Ambulatory Visit

## 2024-01-28 ENCOUNTER — Other Ambulatory Visit: Payer: Self-pay

## 2024-01-31 ENCOUNTER — Ambulatory Visit: Admitting: Physician Assistant

## 2024-02-07 ENCOUNTER — Ambulatory Visit: Payer: Self-pay | Admitting: Physician Assistant

## 2024-02-07 ENCOUNTER — Telehealth (HOSPITAL_COMMUNITY): Admitting: Psychiatry

## 2024-02-07 ENCOUNTER — Ambulatory Visit
Admission: RE | Admit: 2024-02-07 | Discharge: 2024-02-07 | Disposition: A | Discharge status: Home / Self Care | Source: Ambulatory Visit | Attending: Physician Assistant | Admitting: Physician Assistant

## 2024-02-07 ENCOUNTER — Ambulatory Visit (INDEPENDENT_AMBULATORY_CARE_PROVIDER_SITE_OTHER): Admitting: Neurology

## 2024-02-07 DIAGNOSIS — R202 Paresthesia of skin: Secondary | ICD-10-CM

## 2024-02-07 DIAGNOSIS — H532 Diplopia: Secondary | ICD-10-CM | POA: Diagnosis not present

## 2024-02-07 NOTE — Progress Notes (Signed)
 No answer at 02/07/2024 at 1:04pm

## 2024-02-07 NOTE — Procedures (Signed)
  [facility]  [address] [place], [address]  [place], [place] [address] Tel: [phone] Fax: [phone] Test Date:  02/07/2024  Patient: Mitchell [name] DOB: 08-03-1960 Physician: Tonita Blanch, DO  Sex: Male Height: 5' 10 Ref Phys: Camie Dina RIGGERS  ID#: [id number]   Technician:    History: This is a 63 year old man referred for evaluation of right arm pain.  NCV & EMG Findings: Extensive electrodiagnostic testing of the right upper extremity shows:  Right median, ulnar, and mixed palmar sensory responses are within normal limits.   Right median and ulnar motor responses are within normal limits.   There is no evidence of active or chronic motor axonal loss changes affecting any of the tested muscles.  Motor unit configuration and recruitment pattern is within normal limits.    Impression: This is a normal study of the right upper extremity.  In particular, there is no evidence of carpal tunnel syndrome, ulnar neuropathy, or cervical radiculopathy.   ___________________________ Tonita Blanch, DO    Nerve Conduction Studies   Stim Site NR Peak (ms) Norm Peak (ms) O-P Amp (V) Norm O-P Amp  Right Median Anti Sensory (2nd Digit)  32 C  Wrist    3.4 <3.8 13.9 >10  Right Ulnar Anti Sensory (5th Digit)  32 C  Wrist    3.1 <3.2 8.8 >5     Stim Site NR Onset (ms) Norm Onset (ms) O-P Amp (mV) Norm O-P Amp Site1 Site2 Delta-0 (ms) Dist (cm) Vel (m/s) Norm Vel (m/s)  Right Median Motor (Abd Poll Brev)  32 C  Wrist    3.7 <4.0 7.3 >5 Elbow Wrist 5.5 30.0 55 >50  Elbow    9.2  6.4         Right Ulnar Motor (Abd Dig Minimi)  32 C  Wrist    2.6 <3.1 10.4 >7 B Elbow Wrist 3.7 21.0 57 >50  B Elbow    6.3  10.2  A Elbow B Elbow 1.7 10.0 59 >50  A Elbow    8.0  9.9            Stim Site NR Peak (ms) Norm Peak (ms) P-T Amp (V) Site1 Site2 Delta-P (ms) Norm Delta (ms)  Right Median/Ulnar Palm Comparison (Wrist - 8cm)  32 C  Median Palm    2.1 <2.2 31.0 Median Palm  Ulnar Palm 0.1   Ulnar Palm    2.0 <2.2 6.5       Electromyography   Side Muscle Ins.Act Fibs Fasc Recrt Amp Dur Poly Activation Comment  Right 1stDorInt Nml Nml Nml Nml Nml Nml Nml Nml N/A  Right PronatorTeres Nml Nml Nml Nml Nml Nml Nml Nml N/A  Right Biceps Nml Nml Nml Nml Nml Nml Nml Nml N/A  Right Triceps Nml Nml Nml Nml Nml Nml Nml Nml N/A  Right Deltoid Nml Nml Nml Nml Nml Nml Nml Nml N/A      Waveforms:

## 2024-02-07 NOTE — Progress Notes (Signed)
 No answer at 4:25pm 02/07/2024

## 2024-02-08 ENCOUNTER — Other Ambulatory Visit

## 2024-02-08 NOTE — Progress Notes (Signed)
 Mail box is full unable to leave a message at 1:40pm 02/08/2024

## 2024-02-11 NOTE — Progress Notes (Unsigned)
 BH MD/PA/NP OP Progress Note  02/12/2024 9:01 AM Mitchell [name]  MRN:  [id number]  Visit Diagnosis:    ICD-10-CM   1. Mild cognitive impairment of uncertain or unknown etiology  G31.84     2. Generalized anxiety disorder  F41.1 traZODone  (DESYREL ) 50 MG tablet    3. Moderate episode of recurrent major depressive disorder (HCC)  F33.1 traZODone  (DESYREL ) 50 MG tablet      Assessment: Mitchell [name] is a 63 y.o. 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  During initial evaluation patient reported neurovegetative symptoms of depression including low mood, insomnia, worthlessness, negative self thoughts, fatigue, difficulty concentrating, and passive SI without intent or plan.  He also endorsed significant symptoms of anxiety including constant worry that he is unable to control, difficulty relaxing, restlessness, and increased irritability.  Of note patient had been having memory issues such as forgetting to turn off the stove or water.  Neuropsych testing was performed which showed mild cognitive impairment of unclear etiology, with depression/anxiety being possible causes.  Neuro workup was negative on EEG and MRI showed only minimal progression in small vessel ischemic changes that is less likely to be consistent with the severity and dropping his cognition/memory. Psychosocially patient is struggling with increased isolation and financial stressors.  He met criteria for MDD and generalized anxiety disorder.  Mitchell [name] presents for follow-up evaluation. Today, 02/12/24, patient reports ongoing neurovegetative in terms of depression, anxiety,  and irritability.  This is closely tied to the ongoing psychosocial stressors.  Patient endorsed passive SI though denies any intent or plan to act on it.  He lists his dog, brother, and kids as protective factors.  Did recommend initiation of antidepressant medication which patient declined.  He will continue to take trazodone  as needed.  He was also open to trying virtual support groups.  Of note patient had MRI that showed chronic/subcortical infarct in the right frontal operculum and insula that was new from prior MRI in 2023.  He had endorsed symptoms consistent with these findings back in the spring.  Recommended that he follow-up with his neurologist to discuss this further.  Psychotherapeutic interventions were used during today's session. From 8:38 AM to 9:01 AM . Therapeutic interventions included empathic listening, supportive therapy, cognitive and behavioral therapy, motivational interviewing. Used supportive interviewing techniques to provide emotional validation. Worked on cognitive reframing techniques and recommendations made for behavioral activation.  Improvement was evidenced by patient's participation and identified commitment to therapy goals.    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 - EEG WNL - Crisis resources reviewed - Follow up in 5-6 months  Chief Complaint:  Chief Complaint  Patient presents with   Follow-up   HPI: Mitchell [name] presents today reporting that a lot of the same stuff the last 5 months.  He was most interested in the results  of his recent brain MRI.  We  briefly reviewed these the recommended patient connect with his neurologist to discuss further.  Patient is scheduled to see them in a month.  Notably MRI mentioned an infarct and patient had an episode this past spring where he lost vision could not stand for several minutes.  He is uncertain if it lasted longer than that as this time perception was distorted then.  After the incident he had numbness in his arm for several weeks.  Encouraged patient to discuss this further with his neurologist and keep an open mind around medications.  Mitchell [name] was more forthcoming reporting that he has felt depressed, isolated, and pissed with the situation he is in.  He is frustrated with his life and is overall just tired with it.  He is ready for the Lord to take him though denies any SI or thoughts of self-harm.  It has gotten more to the point where he has wondered if there is any meaning to keeping up with things.  For instance he is no longer keeping his house is clean and dishes have piled up in the sink.  He also has not been going out to church or anywhere else in part due to finances but in part due to lack of interest.  He spends his time at home with his dog which is the main positive protective factor he identifies currently in his life.  He does also identify his kids and brother though does not talk to any of them consistently.  Pathic listening techniques were used and support was provided.  Did encourage behavioral activation the patient expressed hesitancy.  Part of hesitancy was related around anxiety he has gotten episodes of pacing as well as increased anxiety when driving lately.  Given this and patient's hesitation with leaving the house we suggested starting with virtual support groups.  Was provided with information for [facility] which has free groups patient could take part in.  We had also recommended medication however Rondell declined any changes today.  He does take the trazodone  25 mg to help with  the anxiety and insomnia.  He does not want to take any other medications and thus opted to hold off on starting SSRI medication.\  Past Psychiatric History:  Patient denies any psychiatric care other than a psychiatrist at church once after his divorce who started on Zoloft for a month.  He denies prior suicide attempts or psychiatric hospitalizations.  Has been on Zoloft, Prozac  (fatigue), Effexor  (headaches and irritability), Xanax  and Ambien  in the past.  Started  trazodone  on 07/18/2022.  Hx of alcohol use been in remission for 2 years. Does endorse cravings. Used to smoke marijuana several decades ago. Denies any other substance use.   Past Medical History:  Past Medical History:  Diagnosis Date   Alcohol dependence in sustained full remission    Sober for past 1 year (05/02/22)   Benign essential hypertension 12/07/2020   Chest pain 06/01/2017   Chronic low back pain 03/11/2014   Constipation 03/06/2014   Coronary artery disease of native artery of native heart with stable angina pectoris 03/04/2014   a. 02/2014 Inf STEMI/PCI: LM nl, LAD min irregs, Diags 1/2/3 min irregs, LCX min irregs, OM1/2/3 min irregs, RI min irregs, RCA 100p (3.5x18 Xience DES), PDA/PL nl, EF 45-50% basal inf HK.   Dizziness, nonspecific 01/10/2021   Generalized anxiety disorder 03/04/2014   History of acute inferior wall MI 02/2014   Tx with DES to  RCA   History of COVID-19 06/2018   Hyperlipidemia    Intolerance of drug    dyspnea with Brilinta  >> changed to Effient     Ischemic cardiomyopathy    a. EF 45-50% with inf HK at Inf STEMI // b. Echo in 10/15 with EF 55-60% with inf and inf-lat HK   Major depressive disorder    Mild cognitive impairment of uncertain or unknown etiology 05/02/2022   NSVT (nonsustained ventricular tachycardia)    a. in the setting of inferior STEMI;  b. 02/2014 Echo: EF 55-60%, Gr 1 DD, basal-midinferolateral/inf HK, Gr 1 DD, triv TR.   Statin intolerance 01/09/2021   Status  post coronary artery stent placement    inferior STEMI 02/2014 with 100% RCA, (single vessel disease) stented with Xience DES, EF 55-60%.  Patent stent by cath 12/08/2020.   TIA (transient ischemic attack) 01/12/2017   Ventricular ectopy 12/07/2020    Past Surgical History:  Procedure Laterality Date   CARDIAC CATHETERIZATION     02/28/2014 prox RCA single vessel occlusion treated with DES   CORONARY ANGIOPLASTY WITH STENT PLACEMENT     LEFT HEART CATH Bilateral 02/28/2014   Procedure: LEFT HEART CATH;  Surgeon: Deatrice DELENA Cage, MD;  Location: [facility] CATH LAB;  Service: Cardiovascular;  Laterality: Bilateral;   TONSILLECTOMY     WRIST SURGERY      Family History:  Family History  Problem Relation Age of Onset   Parkinson's disease Mother    Hypertension Father    Heart attack Brother 55   Dementia Maternal Grandmother    Dementia Paternal Grandmother     Social History:  Social History   Socioeconomic History   Marital status: Divorced    Spouse name: Not on file   Number of children: 2   Years of education: 9   Highest education level: 9th grade  Occupational History   Occupation: Disability    Comment: telephone lineman  Tobacco Use   Smoking status: Former   Smokeless tobacco: Never  Advertising account planner   Vaping status: Never Used  Substance and Sexual Activity   Alcohol use: Not Currently    Comment: hx of significant alcohol abuse; sober since 2022.   Drug use: No   Sexual activity: Not Currently  Other Topics Concern   Not on file  Social History Narrative   ** Merged History Encounter **    Right handed   Drinks caffeine   Single story   Divorced 23 years, one dog   Social Drivers of Health   Financial Resource Strain: High Risk (09/04/2023)   Overall Financial Resource Strain (CARDIA)    Difficulty of Paying Living Expenses: Very hard  Food Insecurity: Food Insecurity Present (03/09/2023)   Hunger Vital Sign    Worried About Running Out of Food in the Last  Year: Sometimes true    Ran Out of Food in the Last Year: Sometimes true  Transportation Needs: Unmet Transportation Needs (03/09/2023)   PRAPARE - Administrator, Civil Service (Medical): Yes    Lack of Transportation (Non-Medical): Yes  Physical Activity: Not on file  Stress: Not on file  Social Connections: Not on file    Allergies:  Allergies  Allergen Reactions   Ticagrelor  Shortness Of Breath   Statins Other (See Comments)    Other Reaction(s): Myalgias    Current Medications: Current Outpatient Medications  Medication Sig Dispense Refill   aspirin  EC 81 MG tablet Take by mouth.     Evolocumab  (  REPATHA  SURECLICK) 140 MG/ML SOAJ Inject 140 mg into the skin every 14 (fourteen) days. 6 mL 3   ezetimibe  (ZETIA ) 10 MG tablet Take 1 tablet (10 mg total) by mouth daily. 90 tablet 3   furosemide  (LASIX ) 40 MG tablet Take 1 tablet (40 mg total) by mouth daily as needed. 90 tablet 3   isosorbide  mononitrate (IMDUR ) 30 MG 24 hr tablet Take 1 tablet (30 mg total) by mouth at bedtime. 90 tablet 3   losartan  (COZAAR ) 50 MG tablet Take 1 tablet (50 mg total) by mouth daily. 90 tablet 2   magnesium  oxide (MAG-OX) 400 (240 Mg) MG tablet Take 1 tablet (400 mg total) by mouth daily. 90 tablet 2   metoprolol  succinate (TOPROL -XL) 50 MG 24 hr tablet Take 1 tablet (50 mg total) by mouth daily. Take with or immediately following a meal. 90 tablet 3   nitroGLYCERIN  (NITROSTAT ) 0.4 MG SL tablet Place 1 tablet (0.4 mg total) under the tongue every 5 (five) minutes as needed. 25 tablet 3   traZODone  (DESYREL ) 50 MG tablet Take 0.5-1 tablets (25-50 mg total) by mouth at bedtime. 90 tablet 1   No current facility-administered medications for this visit.     Psychiatric Specialty Exam: Review of Systems  There were no vitals taken for this visit.There is no height or weight on file to calculate BMI.  General Appearance: Well Groomed  Eye Contact:  Good  Speech:  Clear and Coherent   Volume:  Normal  Mood:  Anxious and Depressed  Affect:  Congruent  Thought Process:  Coherent and Linear  Orientation:  Full (Time, Place, and Person)  Thought Content: Logical   Suicidal Thoughts:  No  Homicidal Thoughts:  No  Memory:  Immediate;   Poor  Judgement:  Good  Insight:  Fair  Psychomotor Activity:  Normal  Concentration:  Concentration: Fair  Recall:  Fair  Fund of Knowledge: Fair  Language: Good  Akathisia:  NA    AIMS (if indicated): not done  Assets:  Communication Skills Housing  ADL's:  Intact  Cognition: WNL  Sleep:  Fair   Metabolic Disorder Labs: Lab Results  Component Value Date   HGBA1C 5.5 07/26/2023   MPG 96.8 06/01/2017   MPG 105 03/11/2014   No results found for: PROLACTIN Lab Results  Component Value Date   CHOL 141 10/11/2023   TRIG 151 (H) 10/11/2023   HDL 53 10/11/2023   CHOLHDL 2.7 10/11/2023   VLDL 43 (H) 06/02/2017   LDLCALC 62 10/11/2023   LDLCALC 171 (H) 03/09/2023   Lab Results  Component Value Date   TSH 0.58 04/06/2022   TSH 1.429 06/01/2017    Therapeutic Level Labs: No results found for: LITHIUM No results found for: VALPROATE No results found for: CBMZ   Screenings: GAD-7    Flowsheet Row Office Visit from 12/03/2023 in [facility] at [facility] Office Visit from 07[facility]26/2023 in [facility] at [facility] Office Visit from 07/18/2022 in [facility]  Total GAD-7 Score 12 17 14    PHQ2-9    Flowsheet Row Office Visit from 12/03/2023 in [facility] at [facility] Video Visit from 09/03/2023 in [facility] at [facility] Visit from 07/26/2023 in [facility] at [facility] Visit from 07/18/2022 in [facility]  PHQ-2 Total Score 4 1 4 2   PHQ-9 Total Score -- 4 23 12  Flowsheet Row UC from 01/01/2023 in [facility] at [facility])  Office Visit from 07/18/2022 in [facility]  C-SSRS RISK CATEGORY No Risk No Risk    Collaboration of Care: Collaboration of Care: Other provider involved in patient's care AEB PCP and neurology chart review  Patient/Guardian was advised Release of Information must be obtained prior to any record release in order to collaborate their care with an outside provider. Patient/Guardian was advised if they have not already done so to contact the registration department to sign all necessary forms in order for us  to release information regarding their care.   Consent: Patient/Guardian gives verbal consent for treatment and assignment of benefits for services provided during this visit. Patient/Guardian expressed understanding and agreed to proceed.    Arvella CHRISTELLA Finder, MD 02/12/2024, 9:01 AM   Virtual Visit via Video Note  I connected with Darel Bers on 02/12/24 at  8:30 AM EDT by a video enabled telemedicine application and verified that I am speaking with the correct person using two identifiers.  Location: Patient: Home Provider: Home Office   I discussed the limitations of evaluation and management by telemedicine and the availability of in person appointments. The patient expressed understanding and agreed to proceed.   I discussed the assessment and treatment plan with the patient. The patient was provided an opportunity to ask questions and all were answered. The patient agreed with the plan and demonstrated an understanding of the instructions.   The patient was advised to call back or seek an in-person evaluation if the symptoms worsen or if the condition fails to improve as anticipated.  I provided 25 minutes of non-face-to-face time during this encounter.  Arvella CHRISTELLA Finder, MD

## 2024-02-12 ENCOUNTER — Encounter (HOSPITAL_COMMUNITY): Payer: Self-pay

## 2024-02-12 ENCOUNTER — Encounter (HOSPITAL_COMMUNITY): Payer: Self-pay | Admitting: Psychiatry

## 2024-02-12 ENCOUNTER — Telehealth (HOSPITAL_BASED_OUTPATIENT_CLINIC_OR_DEPARTMENT_OTHER): Admitting: Psychiatry

## 2024-02-12 DIAGNOSIS — F411 Generalized anxiety disorder: Secondary | ICD-10-CM

## 2024-02-12 DIAGNOSIS — G3184 Mild cognitive impairment, so stated: Secondary | ICD-10-CM

## 2024-02-12 DIAGNOSIS — F331 Major depressive disorder, recurrent, moderate: Secondary | ICD-10-CM

## 2024-02-12 MED ORDER — TRAZODONE HCL 50 MG PO TABS: 25.0000 mg | ORAL_TABLET | Freq: Every day | ORAL | 1 refills | Status: DC

## 2024-02-12 NOTE — Addendum Note (Signed)
 Addended by: CARVIN CROCK on: 02/12/2024 09:02 AM   Modules accepted: Level of Service

## 2024-02-13 ENCOUNTER — Telehealth: Payer: Self-pay | Admitting: Physician Assistant

## 2024-02-13 NOTE — Telephone Encounter (Signed)
 I tried to call again no answer at 3:11pm

## 2024-02-13 NOTE — Telephone Encounter (Signed)
 Pt called in this morning. Pt stated that he is returning a call. Thanks

## 2024-02-13 NOTE — Telephone Encounter (Signed)
 I left message to call office to discuss results.

## 2024-02-15 ENCOUNTER — Other Ambulatory Visit: Payer: Self-pay | Admitting: Physician Assistant

## 2024-02-15 ENCOUNTER — Other Ambulatory Visit: Payer: Self-pay

## 2024-02-15 DIAGNOSIS — I6389 Other cerebral infarction: Secondary | ICD-10-CM

## 2024-02-15 DIAGNOSIS — G459 Transient cerebral ischemic attack, unspecified: Secondary | ICD-10-CM

## 2024-02-15 DIAGNOSIS — I619 Nontraumatic intracerebral hemorrhage, unspecified: Secondary | ICD-10-CM

## 2024-02-15 NOTE — Telephone Encounter (Signed)
 I called patient and I have placed the orders per Camie Dina RIGGERS.Verbally understood orders and thanked me for calling.

## 2024-02-15 NOTE — Telephone Encounter (Signed)
 Pt calling bk

## 2024-02-18 ENCOUNTER — Encounter: Payer: Self-pay | Admitting: Physician Assistant

## 2024-02-25 NOTE — Progress Notes (Signed)
 Mitchell [name]                                          MRN: [id number]   02/25/2024   The [facility]I Quality Team Specialist reviewed this patient medical record for the purposes of chart review for care gap closure. The following were reviewed: chart review for care gap closure-controlling blood pressure.    VBCI Quality Team

## 2024-03-06 ENCOUNTER — Other Ambulatory Visit

## 2024-03-13 ENCOUNTER — Ambulatory Visit (INDEPENDENT_AMBULATORY_CARE_PROVIDER_SITE_OTHER)

## 2024-03-13 ENCOUNTER — Inpatient Hospital Stay: Admission: RE | Admit: 2024-03-13 | Source: Ambulatory Visit

## 2024-03-13 ENCOUNTER — Ambulatory Visit
Admission: RE | Admit: 2024-03-13 | Discharge: 2024-03-13 | Disposition: A | Discharge status: Home / Self Care | Source: Ambulatory Visit | Attending: Physician Assistant | Admitting: Physician Assistant

## 2024-03-13 DIAGNOSIS — I6523 Occlusion and stenosis of bilateral carotid arteries: Secondary | ICD-10-CM | POA: Diagnosis not present

## 2024-03-13 DIAGNOSIS — I6389 Other cerebral infarction: Secondary | ICD-10-CM | POA: Diagnosis not present

## 2024-03-13 DIAGNOSIS — G9389 Other specified disorders of brain: Secondary | ICD-10-CM | POA: Diagnosis not present

## 2024-03-13 DIAGNOSIS — I619 Nontraumatic intracerebral hemorrhage, unspecified: Secondary | ICD-10-CM

## 2024-03-13 LAB — ECHOCARDIOGRAM COMPLETE
Area-P 1/2: 2.76 cm2
P 1/2 time: 819 ms
S' Lateral: 2.9 cm

## 2024-03-13 MED ORDER — IOPAMIDOL (ISOVUE-370) INJECTION 76%
80.0000 mL | Freq: Once | INTRAVENOUS | Status: AC | PRN
  Administered 2024-03-13: 80 mL via INTRAVENOUS

## 2024-03-13 NOTE — Progress Notes (Signed)
 Neurology clinic note   Follow up visit:  R hand Paresthesias   Update 03/14/24:  He denies any new issues he continues to have intermittent paresthesias of the right and an arm but no recurrence of the major event as described in May 2025.  Patient was supposed to have a EMG-NCS studies and MRI of the brain.  He reports that he was on his way to do the EMG and the car broke down, I could not make it on time and I never rescheduled for the MRI of the brain, he reported financial difficulties, which did not allow him to proceed with the films.     Any changes?  Comes and goes, twice in the past year., not as severe as last time .  He continues to have numbness in the left arm greater than the right arm, with tingling in the right arm and itching all over . No further episodes of painful sensation, feeling on the left arm. She still has a burning sensation on the right arm. Shocklike sensation?  No Increased since discharge?  No Muscle bulk loss?  Denies Muscle pain?  Denies Cramps-twitching?  No This activity make it worse or better?  Activity makes it worse, the only thing that he I can do is raise my arm to make it feel better  Able to brush hair and teeth without difficulty?  Yes  able to button shirts?  Yes, when is not hurting Clumsiness yes dropping grasp of objects?  No Continued right from squatted position easily?  Yes When he get out of the chair without using the arms?  Yes Able to walk up steps easily?  Yes Uses an assistive device to walk?  No Significant imbalance with walking?  Sometimes I tripped over my seat, I am clumsy . Any falls or head injuries?  No    Other symptoms  diplopia 2 episodes, 1 year ago months ago and 3 months ago I was going to stand up and got weak and I got blurred vision  could not control the eyes.  Denies to ptosis but he has twitching in his left eye.  He has dysphagia with solids, not liquids for the last year.  I do not know  why the food gets stuck.  He has poor saliva control, drools over the pillow for the last 9 months.  He denies dysarthria, dysphonia, impaired mastication, facial weakness or droop.   He reports night sweats, heat intolerance for a few years, denies excessive mucosal dryness, early satiety, postprandial abdominal bloating, constipation.  He has intermittent diarrhea.  He denies bowel accidents.  He does report bladder dyscontrol I had a couple of episodes where I had an accident .  He has a few episodes of night erection.  Denies any syncope or presyncope.  Denies orthostatic intolerance.  His blood pressure in fact is quite high despite multiple agents. Denies acute orthopnea.  He has dyspnea since his MI 3-1/2 years ago He reports a change in coloration of the legs, with significant itching in the skin Denies fever, anorexia, or unintentional weight loss. Denies any alcohol He eats healthy    Update 09/2023:  He denies any new issues he continues to have intermittent paresthesias of the right and an arm but no recurrence of the major event as described in May 2025.  Patient was supposed to have a EMG-NCS studies and MRI of the brain.  He reports that he was on his way to do the  EMG and the car broke down, I could not make it on time and I never rescheduled for the MRI of the brain, he reported financial difficulties, which did not allow him to proceed with the films.     Any changes?  Comes and goes, twice in the past year., not as severe as last time .  He continues to have numbness in the left arm greater than the right arm, with tingling in the right arm and itching all over . No further episodes of painful sensation, feeling on the left arm. She still has a burning sensation on the right arm. Shocklike sensation?  No Increased since discharge?  No Muscle bulk loss?  Denies Muscle pain?  Denies Cramps-twitching?  No This activity make it worse or better?  Activity makes it worse,  the only thing that he I can do is raise my arm to make it feel better  Able to brush hair and teeth without difficulty?  Yes  able to button shirts?  Yes, when is not hurting Clumsiness yes dropping grasp of objects?  No Continued right from squatted position easily?  Yes When he get out of the chair without using the arms?  Yes Able to walk up steps easily?  Yes Uses an assistive device to walk?  No Significant imbalance with walking?  Sometimes I tripped over my seat, I am clumsy . Any falls or head injuries?  No    Other symptoms  diplopia 2 episodes, 1 year ago months ago and 3 months ago I was going to stand up and got weak and I got blurred vision  could not control the eyes.  Denies to ptosis but he has twitching in his left eye.  He has dysphagia with solids, not liquids for the last year.  I do not know why the food gets stuck.  He has poor saliva control, drools over the pillow for the last 9 months.  He denies dysarthria, dysphonia, impaired mastication, facial weakness or droop.   He reports night sweats, heat intolerance for a few years, denies excessive mucosal dryness, early satiety, postprandial abdominal bloating, constipation.  He has intermittent diarrhea.  He denies bowel accidents.  He does report bladder dyscontrol I had a couple of episodes where I had an accident .  He has a few episodes of night erection.  Denies any syncope or presyncope.  Denies orthostatic intolerance.  His blood pressure in fact is quite high despite multiple agents. Denies acute orthopnea.  He has dyspnea since his MI 3-1/2 years ago He reports a change in coloration of the legs, with significant itching in the skin Denies fever, anorexia, or unintentional weight loss. Denies any alcohol He eats healthy  Initial visit 10/11/2023 this is Mr. Mitchell [name], a 63 y.o. R-handed male with a medical history of hyperlipidemia, hypertension, anxiety, prior alcohol abuse, CAD status post MI, ICM,  history of TIA in 2018 (with left arm numbness, blurry vision with negative MRA-MRI except for minimal ASVD), history of heatstroke in 2012, history of remote TBI, prior seen with mild cognitive impairment of unclear etiology but with a psychiatric component as well as sleep dysfunction (possible OSA) per neuropsych evaluation (2023), chronic back pain with R  sciatica, arthritis of the lower lumbar area, who presents to neurology clinic with the chief complaint of paresthesias R forearm.     When did the symptoms appear? 1 year ago, with pain R  pain from the neck to the arm,  it was a pinched nerve, and the pain did not go away but got better. It was worse with movements especially turning the doorknob. . Then the pain returned about 1-2 months ago but was but it was burning,  above the elbow extending to the palm, followed with hot pins and needles, I even thought I burned my palm  Numbness L arm < R arm    Tingling R arm  Itching All over   Painful  hot feeling L arm   Burning Sensation R arm   a burning recently on the inside of the palm of the hand, lasted for an hour.  Shock-Like Sensation NO Increased Sense to Touch NO Muscle bulk loss?  Denies Muscle pain?  Denies Cramps/Twitching? No Does strength improve after brief exercise?No activity makes it worse The only think I could do is raise my arm  Able to brush hair/teeth without difficulty? Able to button shirts/use zips? Yes not when is hurting Clumsiness/dropping grasped objects? Denies Can you arise from squatted position easily? Yes   Able to get out of chair without using arms? Yes  Able to walk up steps easily? yes Use an assistive device to walk? No  Significant imbalance with walking? Sometimes I trip over my feet. I Am clumsy now Falls? No   Other symptoms  everything blurry 2 episodes, 6 months ago and 1 week ago  I was going to stand up and got weak and I got blurred vision and  could not control the eyes.  Denies ptosis,  He has dysphagia with solids, not with liquids for 6 months , I don't know why the food gets stuck , poor saliva control, I drool over my pillow for the last 6 months. Denies dysarthria/dysphonia, impaired mastication, facial weakness/droop. Sweats a lot at night , he has heat intolerance for the last few years,  denies excessive mucosal dryness, gastroparetic early satiety, postprandial abdominal bloating, constipation, he has significant diarrhea  but denies bowel or bladder dyscontrol, erectile dysfunction  or syncope/presyncope when he had the episode of blurred vision.  Denies  orthostatic intolerance.   Denies acute orthopnea. He has dyspnea since the heart attack 3 years ago.  Recent skin rashes? He has a change in the coloration of the legs, I itch a lot on my skin Any constitutional symptoms like fever, night sweats, anorexia or unintentional weight loss?. NO  ETOH use?: quit 2023  Restrictive diet? He eats healthy Family history of neuropathy/myopathy/NM disease?Father may have had a NM disease . Mo had PD.    Any biopsy done? no Current medications being tried for the patient's symptoms include NO     Prior medications that have been tried:  None     MRI of the brain 05/11/2022 was negative for acute intracranial abnormalities, mild chronic small vessel ischemic changes within the cerebral white matter, minimally progressed from the prior brain MRI in August 2018, no age advanced or lobar predominant parenchymal atrophy    MRI brain 01/2024, personally reviewed remarkable for a chronic cortical/subcortical infarct within the R frontal operculum and insula (MCA vascular territory), mild chronic small vessel changes  MEDICATIONS:  Outpatient Encounter Medications as of 03/14/2024  Medication Sig   aspirin  EC 81 MG tablet Take by mouth.   Evolocumab  (REPATHA  SURECLICK) 140 MG/ML SOAJ Inject 140 mg into the skin every 14 (fourteen) days.   ezetimibe  (ZETIA ) 10 MG  tablet Take 1 tablet (10 mg total) by mouth daily.   furosemide  (LASIX ) 40 MG  tablet Take 1 tablet (40 mg total) by mouth daily as needed.   isosorbide  mononitrate (IMDUR ) 30 MG 24 hr tablet Take 1 tablet (30 mg total) by mouth at bedtime.   losartan  (COZAAR ) 50 MG tablet Take 1 tablet (50 mg total) by mouth daily.   magnesium  oxide (MAG-OX) 400 (240 Mg) MG tablet Take 1 tablet (400 mg total) by mouth daily.   metoprolol  succinate (TOPROL -XL) 50 MG 24 hr tablet Take 1 tablet (50 mg total) by mouth daily. Take with or immediately following a meal.   nitroGLYCERIN  (NITROSTAT ) 0.4 MG SL tablet Place 1 tablet (0.4 mg total) under the tongue every 5 (five) minutes as needed.   traZODone  (DESYREL ) 50 MG tablet Take 0.5-1 tablets (25-50 mg total) by mouth at bedtime.   No facility-administered encounter medications on file as of 03/14/2024.    PAST MEDICAL HISTORY: Past Medical History:  Diagnosis Date   Alcohol dependence in sustained full remission    Sober for past 1 year (05/02/22)   Benign essential hypertension 12/07/2020   Chest pain 06/01/2017   Chronic low back pain 03/11/2014   Constipation 03/06/2014   Coronary artery disease of native artery of native heart with stable angina pectoris 03/04/2014   a. 02/2014 Inf STEMI/PCI: LM nl, LAD min irregs, Diags 1/2/3 min irregs, LCX min irregs, OM1/2/3 min irregs, RI min irregs, RCA 100p (3.5x18 Xience DES), PDA/PL nl, EF 45-50% basal inf HK.   Dizziness, nonspecific 01/10/2021   Generalized anxiety disorder 03/04/2014   History of acute inferior wall MI 02/2014   Tx with DES to RCA   History of COVID-19 06/2018   Hyperlipidemia    Intolerance of drug    dyspnea with Brilinta  >> changed to Effient     Ischemic cardiomyopathy    a. EF 45-50% with inf HK at Inf STEMI // b. Echo in 10/15 with EF 55-60% with inf and inf-lat HK   Major depressive disorder    Mild cognitive impairment of uncertain or unknown etiology 05/02/2022   NSVT  (nonsustained ventricular tachycardia)    a. in the setting of inferior STEMI;  b. 02/2014 Echo: EF 55-60%, Gr 1 DD, basal-midinferolateral/inf HK, Gr 1 DD, triv TR.   Statin intolerance 01/09/2021   Status post coronary artery stent placement    inferior STEMI 02/2014 with 100% RCA, (single vessel disease) stented with Xience DES, EF 55-60%.  Patent stent by cath 12/08/2020.   TIA (transient ischemic attack) 01/12/2017   Ventricular ectopy 12/07/2020    PAST SURGICAL HISTORY: Past Surgical History:  Procedure Laterality Date   CARDIAC CATHETERIZATION     02/28/2014 prox RCA single vessel occlusion treated with DES   CORONARY ANGIOPLASTY WITH STENT PLACEMENT     LEFT HEART CATH Bilateral 02/28/2014   Procedure: LEFT HEART CATH;  Surgeon: Deatrice DELENA Cage, MD;  Location: [facility] CATH [facility];  Service: Cardiovascular;  Laterality: Bilateral;   TONSILLECTOMY     WRIST SURGERY      ALLERGIES: Allergies  Allergen Reactions   Ticagrelor  Shortness Of Breath   Statins Other (See Comments)    Other Reaction(s): Myalgias    FAMILY HISTORY: Family History  Problem Relation Age of Onset   Parkinson's disease Mother    Hypertension Father    Heart attack Brother 92   Dementia Maternal Grandmother    Dementia Paternal Grandmother     SOCIAL HISTORY: Social History   Tobacco Use   Smoking status: Former   Smokeless tobacco: Never  Advertising account planner  Vaping status: Never Used  Substance Use Topics   Alcohol use: Not Currently    Comment: hx of significant alcohol abuse; sober since 2022.   Drug use: No   Social History   Social History Narrative   ** Merged History Encounter **    Right handed   Drinks caffeine   Single story   Divorced 23 years, one dog     OBJECTIVE: PHYSICAL EXAM: There were no vitals taken for this visit.  General:   General appearance: Awake and alert. No distress. Cooperative with exam.  Skin: No obvious rash or jaundice. HEENT: Atraumatic.  Anicteric. Lungs: Non-labored breathing on room air  Heart: Regular Abdomen: Soft, non tender. Extremities: No edema.  No obvious deformity.  Musculoskeletal: No obvious joint swelling. Psych: Affect appropriate.  Neurological: Mental Status: Alert. Speech fluent. Cranial Nerves: CNII:Visual fields grossly intact.  No twitching during the examination.  Fundus not visualized  CNIII, IV, VI: PERRL. No nystagmus. EOMI. CN V: Facial sensation intact bilaterally to fine touch.   CN VII: Facial muscles symmetric and strong. No ptosis  CN VIII: Hearing grossly intact bilaterally. CN IX: No hypophonia. CN X: Palate elevates symmetrically. CN XI: Full strength shoulder shrug bilaterally. CN XII: Tongue protrusion full and midline. No atrophy or fasciculations. No significant dysarthria  Motor: Tone is  normal.   No fasciculations in   extremities.   No atrophy. Toe extension normal  Reflexes: DTR 2/4 B . Negative Babinski   Sensation: Pinprick: Decreased right hand, normal left.    Vibration:  normal  Temperature: normal  Proprioception: normal   Coordination: Intact FTN bilaterally.  Romberg negative.  Gait: Able to rise from chair with arms crossed unassisted. Normal, narrow-based gait. Able to tandem walk. Able to heel-toe walk  PLAN   Mitchell [name] is a 64 y.o. male presenting in follow-up after being seen for paresthesias in the right arm on 10/11/2023, Etiology unclear. EMG/NCS normal. MRI brain remarkable for a chronic cortical/subcortical infarct within the R frontal operculum and insula (MCA vascular territory), mild chronic small vessel changes, likely associated to his symptoms.   Baby ASA daily MRA head and neck  Recommend statins and Secondary stroke prevention, with goal BP less than 140/90, LDL 70 and A1c less than 6  Follow up with primary doctor and Cards. Tobacco cessation counseled.  Follow up in 1 year.  Total time spent reviewing records, interview,  history/exam, documentation, and coordination of care on day of encounter:  45 min    CC: Chandra Toribio POUR, MD [address] [place] [address]

## 2024-03-14 ENCOUNTER — Ambulatory Visit (INDEPENDENT_AMBULATORY_CARE_PROVIDER_SITE_OTHER): Admitting: Physician Assistant

## 2024-03-14 ENCOUNTER — Encounter: Payer: Self-pay | Admitting: Physician Assistant

## 2024-03-14 VITALS — BP 185/97 | HR 66 | Resp 20 | Ht 70.0 in

## 2024-03-14 DIAGNOSIS — R202 Paresthesia of skin: Secondary | ICD-10-CM | POA: Diagnosis not present

## 2024-03-17 ENCOUNTER — Ambulatory Visit: Payer: Self-pay

## 2024-04-08 ENCOUNTER — Ambulatory Visit: Payer: Self-pay

## 2024-04-08 NOTE — Telephone Encounter (Signed)
 Can you call the pt and see why he refused to go to the ED for his chest pain? And that if he is still having symptoms he should go to the emergency department.

## 2024-04-08 NOTE — Telephone Encounter (Signed)
 FYI Only or Action Required?: FYI only for provider: ED advised. Refused ED, refused EMS Patient was last seen in primary care on 12/03/2023 by Chandra Toribio POUR, MD.  Called Nurse Triage reporting Chest Pain.  Symptoms began yesterday.  Interventions attempted: Prescription medications: trazadone, aspirin , losartan , metoprolol , nitroglycerine, imdur , furosemide  .  Symptoms are: unchanged.  Triage Disposition: Call EMS 911 Now  Patient/caregiver understands and will follow disposition?: No, refuses disposition  Copied from CRM #[id number]. Topic: Clinical - Red Word Triage >> Apr 08, 2024 10:14 AM Mitchell [name] wrote: Red Word that prompted transfer to Nurse Triage:  Last night he was having chest pain at a level around 8; He called 911 and they stayed on the phone with him until the pain left and he took 3 nitroglycerin . He wants to see what to do. Reason for Disposition  [1] Chest pain lasts > 5 minutes AND [2] history of heart disease (i.e., angina, heart attack, heart failure, bypass surgery, takes nitroglycerin )  Answer Assessment - Initial Assessment Questions 1. LOCATION: Where does it hurt?       Middle of chest 2. RADIATION: Does the pain go anywhere else? (e.g., into neck, jaw, arms, back)     Denies radiation at this time 3. ONSET: When did the chest pain begin? (Minutes, hours or days)      Last night, reports that he had chest pain last night, 8/10, radiating to his back.  Called EMS, then refused dispatch of ambulance, then needed to take 3 nitros before the chest pain subsided 4. PATTERN: Does the pain come and go, or has it been constant since it started?  Does it get worse with exertion?      This morning, pain is constant 5. DURATION: How long does it last (e.g., seconds, minutes, hours)     Since waking up 6. SEVERITY: How bad is the pain?  (e.g., Scale 1-10; mild, moderate, or severe)     1/10 7. CARDIAC RISK FACTORS: Do you have any history of heart  problems or risk factors for heart disease? (e.g., angina, prior heart attack; diabetes, high blood pressure, high cholesterol, smoker, or strong family history of heart disease)     Angina, history of heart attack x 2, TIA, HTN 8. PULMONARY RISK FACTORS: Do you have any history of lung disease?  (e.g., blood clots in lung, asthma, emphysema, birth control pills)     denies 9. CAUSE: What do you think is causing the chest pain?     unsure 10. OTHER SYMPTOMS: Do you have any other symptoms? (e.g., dizziness, nausea, vomiting, sweating, fever, difficulty breathing, cough)       168/143, pulse 78.  Next reading, 231/133 Wrist cuff, Patient not sure of accuracy. Has not had medication for blood pressure yet this morning, has not missed any doses Light headed, but denies all other symptoms  Protocols used: Chest Pain-A-AH

## 2024-04-09 ENCOUNTER — Ambulatory Visit: Admission: EM | Admit: 2024-04-09 | Discharge: 2024-04-09 | Disposition: A | Discharge status: Home / Self Care

## 2024-04-09 ENCOUNTER — Encounter: Payer: Self-pay | Admitting: Emergency Medicine

## 2024-04-09 ENCOUNTER — Telehealth: Payer: Self-pay | Admitting: Cardiovascular Disease

## 2024-04-09 DIAGNOSIS — R079 Chest pain, unspecified: Secondary | ICD-10-CM | POA: Diagnosis not present

## 2024-04-09 DIAGNOSIS — R42 Dizziness and giddiness: Secondary | ICD-10-CM

## 2024-04-09 DIAGNOSIS — R0602 Shortness of breath: Secondary | ICD-10-CM | POA: Diagnosis not present

## 2024-04-09 MED ORDER — ASPIRIN 81 MG PO CHEW
324.0000 mg | CHEWABLE_TABLET | Freq: Once | ORAL | Status: AC
  Administered 2024-04-09: 324 mg via ORAL

## 2024-04-09 NOTE — Telephone Encounter (Signed)
  Patient is refusing to tell me what he needs to discuss. Says it is in regards to a heart situation. States EMS told him to call and speak to a nurse.

## 2024-04-09 NOTE — ED Notes (Signed)
 Patient is being discharged from the Urgent Care and sent to the Emergency Department via EMS . Per Andra, PA-C, patient is in need of higher level of care due to need for further evaluation of chest pain and dizziness with abnormal EKG. Patient is aware and verbalizes understanding of plan of care.  Vitals:   04/09/24 1433  BP: (!) 170/79  Pulse: 77  Resp: 14  Temp: 98.1 F (36.7 C)  SpO2: 94%

## 2024-04-09 NOTE — Telephone Encounter (Signed)
 Pt called back asking to speak with nurse again. I did secure chat but after 3 minutes I told him she was busy but can have her call back. I asked if he was having CP again and needed to someone else in triage, he answered no I'm fine and disconnected.

## 2024-04-09 NOTE — Telephone Encounter (Signed)
 Reason for Disposition . [1] Systolic BP >= 160 OR Diastolic >= 100 AND [2] cardiac (e.g., breathing difficulty, chest pain) or neurologic symptoms (e.g., new-onset blurred or double vision, unsteady gait)  Answer Assessment - Initial Assessment Questions 1. BLOOD PRESSURE: What is your blood pressure? Did you take at least two measurements 5 minutes apart?     175/100 2. ONSET: When did you take your blood pressure?     today 3. HOW: How did you take your blood pressure? (e.g., automatic home BP monitor, visiting nurse)     automatic 4. HISTORY: Do you have a history of high blood pressure?     yes 5. MEDICINES: Are you taking any medicines for blood pressure? Have you missed any doses recently?     Has not skipped any doses 6. OTHER SYMPTOMS: Do you have any symptoms? (e.g., blurred vision, chest pain, difficulty breathing, headache, weakness)     Fatigue, lightheaded, blurred vision, dull pain in chest - normal for patient 7. PREGNANCY: Is there any chance you are pregnant? When was your last menstrual period?     Na  Took nitrox 3 on Monday: currently having headaches but relates it to nitroglycerin . Has lightheadedness and blurred vision however pt states this is his norm.  Pt vision is bad because he needs glasses.  Pt took BP: readings were 175/100, 197/100 HR70 in right arm then took left arm 175/99 HR 69.  Pt is unsure if readings are correct due to old BP monitor.  Nurse recommended ED and patient refused stated he has no friends nor family that lives near by and [place] and he is not able to leave his dog in case he has to stay in hospital all night.  Pt asked if he could come to office to due BP check: no opening on scheduled until 04/2024: nurse called [facility] and spoke with Rosina to inform of situation asking if pt could be worked into PCP schedule and/or have PCP reach out to pt: informed she would speak with PCP and reach back  out to pt to inform of PCP decision related to plan of care for pt.  Nurse informed pt of findings.  Protocols used: Blood Pressure - High-A-AH

## 2024-04-09 NOTE — Telephone Encounter (Signed)
 FYI Only or Action Required?: FYI only for provider: ED advised.  Patient was last seen in primary care on 12/03/2023 by Chandra Toribio POUR, MD.  Called Nurse Triage reporting Chest Pain and Hypertension.  Symptoms began today.  Interventions attempted: Nothing.  Symptoms are: gradually worsening.  Triage Disposition: Go to ED Now (Notify PCP), Call EMS 911 Now  Patient/caregiver understands and will follow disposition?: No, wishes to speak with PCP

## 2024-04-09 NOTE — Telephone Encounter (Signed)
 Tried calling the patient; VM is FULL; sent patient a MyChart message as well.

## 2024-04-09 NOTE — Telephone Encounter (Signed)
 Patient calling in to return call for clinic. Prior to warm transfer from [facility], patient disconnected. 1st attempt to reach patient, no answer and voicemail box is full.

## 2024-04-09 NOTE — ED Triage Notes (Signed)
 Pt reports dull mid-chest pain and dizziness that has gotten worse over the last 2hrs. Has taken 1 dose of nitroglycerin  with some relief. Pt reports he always has dizziness and chest pain after his heart attack and strokes. Cannot give a time period for when pain started.

## 2024-04-09 NOTE — ED Provider Notes (Signed)
 [facility]    CSN: [id number] Arrival date & time: 04/09/24  1402      History   Chief Complaint Chief Complaint  Patient presents with   Chest Pain   Dizziness    HPI Mitchell [name] is a 63 y.o. male.   Pt with a hx of HTN, Hyperlipidemia, MI, and stroke, presents today due to chest pain that he has had since yesterday. Pt states that he took 3 nitros yesterday with no significant relief. Pt states that chest pain continued today, he took one nitroglycerin  and states that he was advised by PCP to come to urgent care. Pt was found tripoding in waiting room, short of breath, and complaining of chest pain. Pt admits to blurred vision, dizziness, and headache as well. Pt is disgruntled and voices that he does not want 911 called.   The history is provided by the patient.  Chest Pain Associated symptoms: dizziness   Dizziness Associated symptoms: chest pain     Past Medical History:  Diagnosis Date   Alcohol dependence in sustained full remission    Sober for past 1 year (05/02/22)   Benign essential hypertension 12/07/2020   Chest pain 06/01/2017   Chronic low back pain 03/11/2014   Constipation 03/06/2014   Coronary artery disease of native artery of native heart with stable angina pectoris 03/04/2014   a. 02/2014 Inf STEMI/PCI: LM nl, LAD min irregs, Diags 1/2/3 min irregs, LCX min irregs, OM1/2/3 min irregs, RI min irregs, RCA 100p (3.5x18 Xience DES), PDA/PL nl, EF 45-50% basal inf HK.   Dizziness, nonspecific 01/10/2021   Generalized anxiety disorder 03/04/2014   History of acute inferior wall MI 02/2014   Tx with DES to RCA   History of COVID-19 06/2018   Hyperlipidemia    Intolerance of drug    dyspnea with Brilinta  >> changed to Effient     Ischemic cardiomyopathy    a. EF 45-50% with inf HK at Inf STEMI // b. Echo in 10/15 with EF 55-60% with inf and inf-lat HK   Major depressive disorder    Mild cognitive impairment of uncertain or unknown  etiology 05/02/2022   NSVT (nonsustained ventricular tachycardia)    a. in the setting of inferior STEMI;  b. 02/2014 Echo: EF 55-60%, Gr 1 DD, basal-midinferolateral/inf HK, Gr 1 DD, triv TR.   Statin intolerance 01/09/2021   Status post coronary artery stent placement    inferior STEMI 02/2014 with 100% RCA, (single vessel disease) stented with Xience DES, EF 55-60%.  Patent stent by cath 12/08/2020.   TIA (transient ischemic attack) 01/12/2017   Ventricular ectopy 12/07/2020    Patient Active Problem List   Diagnosis Date Noted   Seasonal allergies 12/04/2023   Paresthesias 10/11/2023   Nocturnal muscle cramp 09/03/2023   Elevated LFTs 09/03/2023   Food insecurity 09/03/2023   Orthostasis 09/03/2023   Subacute cough 07/26/2023   Mild cognitive impairment of uncertain or unknown etiology 05/02/2022   Major depressive disorder    Alcohol dependence in sustained full remission (HCC)    Ischemic cardiomyopathy 05/01/2022   NSVT (nonsustained ventricular tachycardia) 05/01/2022   Dizziness, nonspecific 01/10/2021   Statin intolerance 01/09/2021   Status post coronary artery stent placement 01/09/2021   Benign essential hypertension 12/07/2020   Ventricular ectopy 12/07/2020   History of COVID-19 06/2018   Chest pain 06/01/2017   TIA (transient ischemic attack) 01/12/2017   Chronic low back pain 03/11/2014   Constipation 03/06/2014   Coronary artery disease  of native artery of native heart with stable angina pectoris 03/04/2014   Hyperlipidemia 03/04/2014   Generalized anxiety disorder 03/04/2014   History of acute inferior wall MI 02/28/2014    Past Surgical History:  Procedure Laterality Date   CARDIAC CATHETERIZATION     02/28/2014 prox RCA single vessel occlusion treated with DES   CORONARY ANGIOPLASTY WITH STENT PLACEMENT     LEFT HEART CATH Bilateral 02/28/2014   Procedure: LEFT HEART CATH;  Surgeon: Deatrice DELENA Cage, MD;  Location: [facility];  Service:  Cardiovascular;  Laterality: Bilateral;   TONSILLECTOMY     WRIST SURGERY         Home Medications    Prior to Admission medications   Medication Sig Start Date End Date Taking? Authorizing Provider  aspirin  EC 81 MG tablet Take by mouth.   Yes [provider]  Evolocumab  (REPATHA  SURECLICK) 140 MG/ML SOAJ Inject 140 mg into the skin every 14 (fourteen) days. 10/23/23  Yes Verlin Lonni BIRCH, MD  ezetimibe  (ZETIA ) 10 MG tablet Take 1 tablet (10 mg total) by mouth daily. 03/09/23  Yes Verlin Lonni BIRCH, MD  furosemide  (LASIX ) 40 MG tablet Take 1 tablet (40 mg total) by mouth daily as needed. 10/11/23  Yes Verlin Lonni BIRCH, MD  isosorbide  mononitrate (IMDUR ) 30 MG 24 hr tablet Take 1 tablet (30 mg total) by mouth at bedtime. 10/11/23  Yes Verlin Lonni BIRCH, MD  losartan  (COZAAR ) 50 MG tablet Take 1 tablet (50 mg total) by mouth daily. 12/03/23  Yes Chandra Toribio POUR, MD  magnesium  oxide (MAG-OX) 400 (240 Mg) MG tablet Take 1 tablet (400 mg total) by mouth daily. 09/03/23  Yes Chandra Toribio POUR, MD  metoprolol  succinate (TOPROL -XL) 50 MG 24 hr tablet Take 1 tablet (50 mg total) by mouth daily. Take with or immediately following a meal. 10/11/23  Yes Verlin Lonni BIRCH, MD  nitroGLYCERIN  (NITROSTAT ) 0.4 MG SL tablet Place 1 tablet (0.4 mg total) under the tongue every 5 (five) minutes as needed. 10/11/23  Yes Verlin Lonni BIRCH, MD  traZODone  (DESYREL ) 50 MG tablet Take 0.5-1 tablets (25-50 mg total) by mouth at bedtime. 02/12/24  Yes Carvin Arvella HERO, MD    Family History Family History  Problem Relation Age of Onset   Parkinson's disease Mother    Hypertension Father    Heart attack Brother 80   Dementia Maternal Grandmother    Dementia Paternal Grandmother     Social History Social History   Tobacco Use   Smoking status: Former    Passive exposure: Past   Smokeless tobacco: Never  Vaping Use   Vaping status: Never Used  Substance Use Topics    Alcohol use: Not Currently    Comment: hx of significant alcohol abuse; sober since 2022.   Drug use: No     Allergies   Ticagrelor  and Statins   Review of Systems Review of Systems  Cardiovascular:  Positive for chest pain.  Neurological:  Positive for dizziness.     Physical Exam Triage Vital Signs ED Triage Vitals  Encounter Vitals Group     BP 04/09/24 1433 (!) 170/79     Girls Systolic BP Percentile --      Girls Diastolic BP Percentile --      Boys Systolic BP Percentile --      Boys Diastolic BP Percentile --      Pulse Rate 04/09/24 1433 77     Resp 04/09/24 1433 14     Temp  04/09/24 1433 98.1 F (36.7 C)     Temp Source 04/09/24 1433 Oral     SpO2 04/09/24 1433 94 %     Weight --      Height --      Head Circumference --      Peak Flow --      Pain Score 04/09/24 1434 1     Pain Loc --      Pain Education --      Exclude from Growth Chart --    No data found.  Updated Vital Signs BP (!) 170/79 (BP Location: Left Arm)   Pulse 77   Temp 98.1 F (36.7 C) (Oral)   Resp 14   SpO2 94%   Visual Acuity Right Eye Distance:   Left Eye Distance:   Bilateral Distance:    Right Eye Near:   Left Eye Near:    Bilateral Near:     Physical Exam Vitals and nursing note reviewed.  Constitutional:      General: He is in acute distress.     Appearance: He is not diaphoretic.  Eyes:     General: No scleral icterus. Cardiovascular:     Rate and Rhythm: Normal rate and regular rhythm.     Heart sounds: Normal heart sounds.  Pulmonary:     Effort: No respiratory distress.     Breath sounds: Normal breath sounds. No wheezing or rhonchi.  Chest:     Chest wall: No tenderness.  Skin:    General: Skin is warm.  Neurological:     Mental Status: He is oriented to person, place, and time.  Psychiatric:        Mood and Affect: Mood normal.        Behavior: Behavior normal.      UC Treatments / Results  Labs (all labs ordered are listed, but only  abnormal results are displayed) Labs Reviewed - No data to display  EKG   Radiology No results found.  Procedures Procedures (including critical care time)  Medications Ordered in UC Medications  aspirin  chewable tablet 324 mg (324 mg Oral Given 04/09/24 1430)    Initial Impression / Assessment and Plan / UC Course  I have reviewed the triage vital signs and the nursing notes.  Pertinent labs & imaging results that were available during my care of the patient were reviewed by me and considered in my medical decision making (see chart for details).     1st degree heart block noted on EKG performed on 02/2023 and 2nd degree heart block noted on EKG today. 911 was called for transport to the ED.  Final Clinical Impressions(s) / UC Diagnoses   Final diagnoses:  Chest pain, unspecified type  Dizziness and giddiness  Shortness of breath   Discharge Instructions   None    ED Prescriptions   None    PDMP not reviewed this encounter.   Andra Corean BROCKS, PA-C 04/09/24 1504

## 2024-04-09 NOTE — Telephone Encounter (Signed)
 Received call transferred directly from operator and spoke with patient.  He reports he had angina Tuesday morning. He called EMS and talked with them on the phone.  He reports he was not doing good today so he went to Urgent Care.  EMS was called due to EKG that was done at Urgent Care.  Patient reports he could not go to the hospital due to his door at home being open and his dog was at home.  He has currently returned home.  Patient reports he was told at Urgent Care he should have an urgent cardiology appointment. I advised patient he should go to hospital as recommended by Urgent Care.  Patient states he can't do this. He initially said he had a dull ache in his chest and then said he was feeling fine and no longer having chest pain.  Does have some lightheadedness.  Reports being anxious.  Is tearful on the phone Appointment made for patient to see Dr Verlin tomorrow at 2:40. ED precautions reviewed with patient

## 2024-04-09 NOTE — Telephone Encounter (Signed)
 I spoke with patient.  He reports he is going to make arrangements for his dog and go to the ED tomorrow morning.  He then said he didn't know what he should do and wanted to keep appointment for tomorrow.  He denies chest pain at this time but then stated he always has chest pain. I advised patient if symptoms worsened he should be evaluated in ED.  I told him if I saw he was in the ED tomorrow we would cancel his appointment at that time.

## 2024-04-10 ENCOUNTER — Encounter: Payer: Self-pay | Admitting: Cardiovascular Disease

## 2024-04-10 ENCOUNTER — Ambulatory Visit: Attending: Cardiovascular Disease | Admitting: Cardiovascular Disease

## 2024-04-10 VITALS — BP 172/100 | HR 70 | Ht 70.0 in | Wt 195.4 lb

## 2024-04-10 DIAGNOSIS — I10 Essential (primary) hypertension: Secondary | ICD-10-CM | POA: Diagnosis not present

## 2024-04-10 DIAGNOSIS — E7800 Pure hypercholesterolemia, unspecified: Secondary | ICD-10-CM | POA: Diagnosis not present

## 2024-04-10 DIAGNOSIS — I5032 Chronic diastolic (congestive) heart failure: Secondary | ICD-10-CM | POA: Diagnosis not present

## 2024-04-10 DIAGNOSIS — I25110 Atherosclerotic heart disease of native coronary artery with unstable angina pectoris: Secondary | ICD-10-CM | POA: Diagnosis not present

## 2024-04-10 LAB — BASIC METABOLIC PANEL WITH GFR
BUN/Creatinine Ratio: 29 — ABNORMAL HIGH (ref 10–24)
BUN: 23 mg/dL (ref 8–27)
CO2: 20 mmol/L (ref 20–29)
Calcium: 9.4 mg/dL (ref 8.6–10.2)
Chloride: 105 mmol/L (ref 96–106)
Creatinine, Ser: 0.8 mg/dL (ref 0.76–1.27)
Glucose: 87 mg/dL (ref 70–99)
Potassium: 4.5 mmol/L (ref 3.5–5.2)
Sodium: 139 mmol/L (ref 134–144)
eGFR: 99 mL/min/1.73 (ref >59)

## 2024-04-10 LAB — CBC
Hematocrit: 42.9 % (ref 37.5–51.0)
Hemoglobin: 14.3 g/dL (ref 13.0–17.7)
MCH: 34 pg — ABNORMAL HIGH (ref 26.6–33.0)
MCHC: 33.3 g/dL (ref 31.5–35.7)
MCV: 102 fL — ABNORMAL HIGH (ref 79–97)
Platelets: 244 x10E3/uL (ref 150–450)
RBC: 4.21 x10E6/uL (ref 4.14–5.80)
RDW: 12 % (ref 11.6–15.4)
WBC: 5.4 x10E3/uL (ref 3.4–10.8)

## 2024-04-10 MED ORDER — ISOSORBIDE MONONITRATE ER 30 MG PO TB24: 30.0000 mg | ORAL_TABLET | Freq: Two times a day (BID) | ORAL | 2 refills | Status: AC

## 2024-04-10 NOTE — H&P (View-Only) (Signed)
 Chief Complaint  Patient presents with   Follow-up    CAD   History of Present Illness: 63 yo male with history of CAD, prior alcohol abuse, HTN, hyperlipidemia, anxiety, depression, TIA, PVCs and NSVT who is here today for follow up. He was seen as a new patient to re-establish care in our office in March 2024. He had been followed in our group by Dr. Rolan and then was seen in 2019 by Dr. Francyne. 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 A drug eluting stent was placed in the RCA. Echo in 2015 with LVEF=55-60%. He was admitted to [facility] in 2019 and was seen then by Dr. Francyne for evaluation of chest pain in setting of normal troponin. Nuclear stress test without ischemia but evidence of piror inferior infarct. He was admitted to [facility] in July 2022 with chest pain. Echo July 2022 with LVEF=55-60%. Cardiac cath July 2022 at [facility] with mild disease in the LAD and Circumflex and patent RCA stent. Of note, he has not tolerated statins and had dyspnea with Brilinta . He was seen in our office in October 2024 with elevated BP and LE edema. He has missed medications over the years due to financial restraints. At his office visit in May 2025, Norvasc was stopped secondary to LE edema. Echo 03/13/24 with LVEF=60-65%. No valve disease.   He is here today for follow up. He called our office yesterday reporting chest pain that began two nights ago. The pain woke him from his sleep. He was seen in Urgent Care. EKG with sinus, PACs and LAFB. No ischemic changes. He denies dyspnea, palpitations, lower extremity edema, orthopnea, PND, dizziness, near syncope or syncope. He has recently been told that he has to move out of his rental house. He has nowhere to move and is very anxious about this. His BP has been elevated.   Primary Care Physician: Chandra Toribio POUR, MD   Past Medical History:  Diagnosis Date   Alcohol dependence in sustained full remission    Sober  for past 1 year (05/02/22)   Benign essential hypertension 12/07/2020   Chest pain 06/01/2017   Chronic low back pain 03/11/2014   Constipation 03/06/2014   Coronary artery disease of native artery of native heart with stable angina pectoris 03/04/2014   a. 02/2014 Inf STEMI/PCI: LM nl, LAD min irregs, Diags 1/2/3 min irregs, LCX min irregs, OM1/2/3 min irregs, RI min irregs, RCA 100p (3.5x18 Xience DES), PDA/PL nl, EF 45-50% basal inf HK.   Dizziness, nonspecific 01/10/2021   Generalized anxiety disorder 03/04/2014   History of acute inferior wall MI 02/2014   Tx with DES to RCA   History of COVID-19 06/2018   Hyperlipidemia    Intolerance of drug    dyspnea with Brilinta  >> changed to Effient     Ischemic cardiomyopathy    a. EF 45-50% with inf HK at Inf STEMI // b. Echo in 10/15 with EF 55-60% with inf and inf-lat HK   Major depressive disorder    Mild cognitive impairment of uncertain or unknown etiology 05/02/2022   NSVT (nonsustained ventricular tachycardia)    a. in the setting of inferior STEMI;  b. 02/2014 Echo: EF 55-60%, Gr 1 DD, basal-midinferolateral/inf HK, Gr 1 DD, triv TR.   Statin intolerance 01/09/2021   Status post coronary artery stent placement    inferior STEMI 02/2014 with 100% RCA, (single vessel disease) stented with Xience DES, EF 55-60%.  Patent stent by cath 12/08/2020.   TIA (transient ischemic attack) 01/12/2017   Ventricular ectopy 12/07/2020    Past Surgical History:  Procedure Laterality Date   CARDIAC CATHETERIZATION     02/28/2014 prox RCA single vessel occlusion treated with DES   CORONARY ANGIOPLASTY WITH STENT PLACEMENT     LEFT HEART CATH Bilateral 02/28/2014   Procedure: LEFT HEART CATH;  Surgeon: Deatrice DELENA Cage, MD;  Location: [facility];  Service: Cardiovascular;  Laterality: Bilateral;   TONSILLECTOMY     WRIST SURGERY      Current Outpatient Medications  Medication Sig Dispense Refill   isosorbide  mononitrate (IMDUR ) 30 MG 24 hr  tablet Take 1 tablet (30 mg total) by mouth 2 (two) times daily. 180 tablet 2   aspirin  EC 81 MG tablet Take by mouth.     Evolocumab  (REPATHA  SURECLICK) 140 MG/ML SOAJ Inject 140 mg into the skin every 14 (fourteen) days. 6 mL 3   ezetimibe  (ZETIA ) 10 MG tablet Take 1 tablet (10 mg total) by mouth daily. 90 tablet 3   furosemide  (LASIX ) 40 MG tablet Take 1 tablet (40 mg total) by mouth daily as needed. 90 tablet 3   losartan  (COZAAR ) 50 MG tablet Take 1 tablet (50 mg total) by mouth daily. 90 tablet 2   magnesium  oxide (MAG-OX) 400 (240 Mg) MG tablet Take 1 tablet (400 mg total) by mouth daily. 90 tablet 2   metoprolol  succinate (TOPROL -XL) 50 MG 24 hr tablet Take 1 tablet (50 mg total) by mouth daily. Take with or immediately following a meal. 90 tablet 3   nitroGLYCERIN  (NITROSTAT ) 0.4 MG SL tablet Place 1 tablet (0.4 mg total) under the tongue every 5 (five) minutes as needed. 25 tablet 3   traZODone  (DESYREL ) 50 MG tablet Take 0.5-1 tablets (25-50 mg total) by mouth at bedtime. 90 tablet 1   No current facility-administered medications for this visit.    Allergies  Allergen Reactions   Ticagrelor  Shortness Of Breath   Statins Other (See Comments)    Other Reaction(s): Myalgias    Social History   Socioeconomic History   Marital status: Divorced    Spouse name: Not on file   Number of children: 2   Years of education: 9   Highest education level: 9th grade  Occupational History   Occupation: Disability    Comment: telephone lineman  Tobacco Use   Smoking status: Former    Passive exposure: Past   Smokeless tobacco: Never  Vaping Use   Vaping status: Never Used  Substance and Sexual Activity   Alcohol use: Not Currently    Comment: hx of significant alcohol abuse; sober since 2022.   Drug use: No   Sexual activity: Not Currently  Other Topics Concern   Not on file  Social History Narrative   ** Merged History Encounter **    Right handed   Drinks caffeine   Single  story   Divorced 23 years, one dog   Social Drivers of Health   Financial Resource Strain: High Risk (09/04/2023)   Overall Financial Resource Strain (CARDIA)    Difficulty of Paying Living Expenses: Very hard  Food Insecurity: Food Insecurity Present (03/09/2023)   Hunger Vital Sign    Worried About Running Out of Food in the Last Year: Sometimes true    Ran Out of Food in the Last Year: Sometimes true  Transportation Needs: Unmet Transportation Needs (03/09/2023)   PRAPARE - Administrator, Civil Service (  Medical): Yes    Lack of Transportation (Non-Medical): Yes  Physical Activity: Not on file  Stress: Not on file  Social Connections: Not on file  Intimate Partner Violence: Not on file    Family History  Problem Relation Age of Onset   Parkinson's disease Mother    Hypertension Father    Heart attack Brother 58   Dementia Maternal Grandmother    Dementia Paternal Grandmother     Review of Systems:  As stated in the HPI and otherwise negative.   BP (!) 172/100   Pulse 70   Ht 5' 10 (1.778 m)   Wt 195 lb 6.4 oz (88.6 kg)   SpO2 95%   BMI 28.04 kg/m   Physical Examination: General: Well developed, well nourished, NAD  HEENT: OP clear, mucus membranes moist  SKIN: warm, dry. No rashes. Neuro: No focal deficits  Musculoskeletal: Muscle strength 5/5 all ext  Psychiatric: Mood and affect normal  Neck: No JVD, no carotid bruits, no thyromegaly, no lymphadenopathy.  Lungs:Clear bilaterally, no wheezes, rhonci, crackles Cardiovascular: Regular rate and rhythm. No murmurs, gallops or rubs. Abdomen:Soft. Bowel sounds present. Non-tender.  Extremities: No lower extremity edema. Pulses are 2 + in the bilateral DP/PT.  EKG:  EKG is ordered today. The ekg ordered today demonstrates  EKG Interpretation Date/Time:  Thursday April 10 2024 14:28:56 EST Ventricular Rate:  70 PR Interval:  214 QRS Duration:  106 QT Interval:  382 QTC Calculation: 412 R  Axis:   -38  Text Interpretation: Sinus rhythm with 1st degree A-V block Left axis deviation When compared with ECG of 09-Apr-2024 14:27, Premature supraventricular complexes are no longer Present Confirmed by Verlin Bruckner [phone]) on 04/10/2024 2:31:19 PM   Recent Labs: 07/26/2023: Hemoglobin 14.7; Platelets 210 10/11/2023: ALT 11; BUN 25; Creatinine, Ser 0.90; Potassium 4.5; Sodium 142   Lipid Panel    Component Value Date/Time   CHOL 141 10/11/2023 1241   TRIG 151 (H) 10/11/2023 1241   HDL 53 10/11/2023 1241   CHOLHDL 2.7 10/11/2023 1241   CHOLHDL 3.9 06/02/2017 0535   VLDL 43 (H) 06/02/2017 0535   LDLCALC 62 10/11/2023 1241   Wt Readings from Last 3 Encounters:  04/10/24 195 lb 6.4 oz (88.6 kg)  12/12/23 188 lb (85.3 kg)  12/03/23 188 lb 12.8 oz (85.6 kg)    Assessment and Plan:   1. CAD with unstable angina: He has chronic angina but recently having more chest pain, at rest and with exertion. This is likely driven by his increased stress level but will given his history of CAD, will plan a cardiac cath next week. I do not have any openings in the cath lab for several weeks. I will plan his cath 04/16/24 with Dr. Jordan.    I have reviewed the risks, indications, and alternatives to cardiac catheterization, possible angioplasty, and stenting with the patient. Risks include but are not limited to bleeding, infection, vascular injury, stroke, myocardial infection, arrhythmia, kidney injury, radiation-related injury in the case of prolonged fluoroscopy use, emergency cardiac surgery, and death. The patient understands the risks of serious complication is 1-2 in 1000 with diagnostic cardiac cath and 1-2% or less with angioplasty/stenting.  -Continue ASA, Imdur , Toprol  and Zetia .  -Increase Imdur  to 30 mg po BID -He is statin intolerant  -CBC and BMET today  2. HTN: BP is elevated. Will increase Imdur  today. I think his BP is elevated due to his stress level. He is upset today  about driving into [facility]  and about his housing situation.   3. Hyperlipidemia: He is intolerant of statins. LDL 62 in May 2025. Continue Repatha  and Zetia .     4. Chronic diastolic CHF: Wt is stable. No volume overload on exam. Continue Lasix  as needed.   Labs/ tests ordered today include:   Orders Placed This Encounter  Procedures   Basic Metabolic Panel (BMET)   CBC   Referral to HRT/VAS Care Navigation   EKG 12-Lead   Disposition:   F/U with me in 12 months  Signed, Lonni Cash, MD, [facility] 04/10/2024 3:31 PM    [facility] [address] [place], [place], [place]  [address] Phone: [phone]; Fax: [phone]

## 2024-04-10 NOTE — Telephone Encounter (Signed)
 Called and spoke with patient.

## 2024-04-10 NOTE — Patient Instructions (Signed)
 Medication Instructions:  Your physician has recommended you make the following change in your medication: Increase Isosorbide  mononitrate to 30 mg by mouth twice daily  *If you need a refill on your cardiac medications before your next appointment, please call your pharmacy*  Lab Work: Have lab work drawn in the lab on the first floor today--CBC and BMP If you have labs (blood work) drawn today and your tests are completely normal, you will receive your results only by: MyChart Message (if you have MyChart) OR A paper copy in the mail If you have any lab test that is abnormal or we need to change your treatment, we will call you to review the results.  Testing/Procedures: Your physician has requested that you have a cardiac catheterization. Cardiac catheterization is used to diagnose and/or treat various heart conditions. Doctors may recommend this procedure for a number of different reasons. The most common reason is to evaluate chest pain. Chest pain can be a symptom of coronary artery disease (CAD), and cardiac catheterization can show whether plaque is narrowing or blocking your heart's arteries. This procedure is also used to evaluate the valves, as well as measure the blood flow and oxygen levels in different parts of your heart. For further information please visit [email or web address] Please follow instruction sheet, as given. Scheduled for 11/26  Follow-Up: At [facility], you and your health needs are our priority.  As part of our continuing mission to provide you with exceptional heart care, our providers are all part of one team.  This team includes your primary Cardiologist (physician) and Advanced Practice Providers or APPs (Physician Assistants and Nurse Practitioners) who all work together to provide you with the care you need, when you need it.  Your next appointment:   3-4 week(s)  Provider:   One of our Advanced Practice Providers (APPs): Morse Clause,  PA-C  Lamarr Satterfield, NP Miriam Shams, NP  Olivia Pavy, PA-C Josefa Beauvais, NP  Leontine Salen, PA-C Orren Fabry, PA-C  Hao Meng, PA-C Ernest Dick, NP  Damien Braver, NP Jon Hails, PA-C  Waddell Donath, PA-C    Dayna Dunn, PA-C  Scott Weaver, PA-C Lum Louis, NP Katlyn West, NP Callie Goodrich, PA-C  Xika Zhao, NP Sheng Haley, PA-C    Kathleen Johnson, PA-C       We recommend signing up for the patient portal called MyChart.  Sign up information is provided on this After Visit Summary.  MyChart is used to connect with patients for Virtual Visits (Telemedicine).  Patients are able to view lab/test results, encounter notes, upcoming appointments, etc.  Non-urgent messages can be sent to your provider as well.   To learn more about what you can do with MyChart, go to [email or web address].   Other Instructions  [place] [facility] A DEPT OF [facility] AT [address] [facility] DEPT OF THE [facility] [address] [place] [address] Dept: [phone] Loc: [phone]  MATTEW CHRISWELL  04/10/2024  You are scheduled for a Cardiac Catheterization on Wednesday, November 26 with Dr. Peter Jordan.  1. Please arrive at the [facility] (Main Entrance A) at [facility]: [address] [place], [place] [address] at 9:30 AM (This time is 2 hour(s) before your procedure to ensure your preparation).   Free valet parking service is available. You will check in at ADMITTING. The support person will be asked to wait in the waiting room.  It is OK to have  someone drop you off and come back when you are ready to be discharged.    Special note: Every effort is made to have your procedure done on time. Please understand that emergencies sometimes delay scheduled procedures.  2. Diet: Nothing to eat after midnight.   3. Hydration: You need to be well hydrated before your procedure. On November 26, you may drink approved liquids (see  below) until 2 hours before the procedure, with 16 oz of water as your last intake.   List of approved liquids water, clear juice, clear tea, black coffee, fruit juices, non-citric and without pulp, carbonated beverages, Gatorade, Kool -Aid, plain Jello-O and plain ice popsicles.  4. Labs: done on 11/20 5. Medication instructions in preparation for your procedure:  Do not take furosemide  the day of the procedure   Contrast Allergy: No   On the morning of your procedure, take your Aspirin  81 mg and any morning medicines NOT listed above.  You may use sips of water.  6. Plan to go home the same day, you will only stay overnight if medically necessary. 7. Bring a current list of your medications and current insurance cards. 8. You MUST have a responsible person to drive you home. 9. Someone MUST be with you the first 24 hours after you arrive home or your discharge will be delayed. 10. Please wear clothes that are easy to get on and off and wear slip-on shoes.  Thank you for allowing us  to care for you!   -- [facility]

## 2024-04-10 NOTE — Progress Notes (Signed)
 Chief Complaint  Patient presents with   Follow-up    CAD   History of Present Illness: 63 yo male with history of CAD, prior alcohol abuse, HTN, hyperlipidemia, anxiety, depression, TIA, PVCs and NSVT who is here today for follow up. He was seen as a new patient to re-establish care in our office in March 2024. He had been followed in our group by Dr. Rolan and then was seen in 2019 by Dr. Francyne. 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 A drug eluting stent was placed in the RCA. Echo in 2015 with LVEF=55-60%. He was admitted to [facility] in 2019 and was seen then by Dr. Francyne for evaluation of chest pain in setting of normal troponin. Nuclear stress test without ischemia but evidence of piror inferior infarct. He was admitted to [facility] in July 2022 with chest pain. Echo July 2022 with LVEF=55-60%. Cardiac cath July 2022 at [facility] with mild disease in the LAD and Circumflex and patent RCA stent. Of note, he has not tolerated statins and had dyspnea with Brilinta . He was seen in our office in October 2024 with elevated BP and LE edema. He has missed medications over the years due to financial restraints. At his office visit in May 2025, Norvasc was stopped secondary to LE edema. Echo 03/13/24 with LVEF=60-65%. No valve disease.   He is here today for follow up. He called our office yesterday reporting chest pain that began two nights ago. The pain woke him from his sleep. He was seen in Urgent Care. EKG with sinus, PACs and LAFB. No ischemic changes. He denies dyspnea, palpitations, lower extremity edema, orthopnea, PND, dizziness, near syncope or syncope. He has recently been told that he has to move out of his rental house. He has nowhere to move and is very anxious about this. His BP has been elevated.   Primary Care Physician: Chandra Toribio POUR, MD   Past Medical History:  Diagnosis Date   Alcohol dependence in sustained full remission    Sober  for past 1 year (05/02/22)   Benign essential hypertension 12/07/2020   Chest pain 06/01/2017   Chronic low back pain 03/11/2014   Constipation 03/06/2014   Coronary artery disease of native artery of native heart with stable angina pectoris 03/04/2014   a. 02/2014 Inf STEMI/PCI: LM nl, LAD min irregs, Diags 1/2/3 min irregs, LCX min irregs, OM1/2/3 min irregs, RI min irregs, RCA 100p (3.5x18 Xience DES), PDA/PL nl, EF 45-50% basal inf HK.   Dizziness, nonspecific 01/10/2021   Generalized anxiety disorder 03/04/2014   History of acute inferior wall MI 02/2014   Tx with DES to RCA   History of COVID-19 06/2018   Hyperlipidemia    Intolerance of drug    dyspnea with Brilinta  >> changed to Effient     Ischemic cardiomyopathy    a. EF 45-50% with inf HK at Inf STEMI // b. Echo in 10/15 with EF 55-60% with inf and inf-lat HK   Major depressive disorder    Mild cognitive impairment of uncertain or unknown etiology 05/02/2022   NSVT (nonsustained ventricular tachycardia)    a. in the setting of inferior STEMI;  b. 02/2014 Echo: EF 55-60%, Gr 1 DD, basal-midinferolateral/inf HK, Gr 1 DD, triv TR.   Statin intolerance 01/09/2021   Status post coronary artery stent placement    inferior STEMI 02/2014 with 100% RCA, (single vessel disease) stented with Xience DES, EF 55-60%.  Patent stent by cath 12/08/2020.   TIA (transient ischemic attack) 01/12/2017   Ventricular ectopy 12/07/2020    Past Surgical History:  Procedure Laterality Date   CARDIAC CATHETERIZATION     02/28/2014 prox RCA single vessel occlusion treated with DES   CORONARY ANGIOPLASTY WITH STENT PLACEMENT     LEFT HEART CATH Bilateral 02/28/2014   Procedure: LEFT HEART CATH;  Surgeon: Deatrice DELENA Cage, MD;  Location: [facility] CATH LAB;  Service: Cardiovascular;  Laterality: Bilateral;   TONSILLECTOMY     WRIST SURGERY      Current Outpatient Medications  Medication Sig Dispense Refill   isosorbide  mononitrate (IMDUR ) 30 MG 24 hr  tablet Take 1 tablet (30 mg total) by mouth 2 (two) times daily. 180 tablet 2   aspirin  EC 81 MG tablet Take by mouth.     Evolocumab  (REPATHA  SURECLICK) 140 MG/ML SOAJ Inject 140 mg into the skin every 14 (fourteen) days. 6 mL 3   ezetimibe  (ZETIA ) 10 MG tablet Take 1 tablet (10 mg total) by mouth daily. 90 tablet 3   furosemide  (LASIX ) 40 MG tablet Take 1 tablet (40 mg total) by mouth daily as needed. 90 tablet 3   losartan  (COZAAR ) 50 MG tablet Take 1 tablet (50 mg total) by mouth daily. 90 tablet 2   magnesium  oxide (MAG-OX) 400 (240 Mg) MG tablet Take 1 tablet (400 mg total) by mouth daily. 90 tablet 2   metoprolol  succinate (TOPROL -XL) 50 MG 24 hr tablet Take 1 tablet (50 mg total) by mouth daily. Take with or immediately following a meal. 90 tablet 3   nitroGLYCERIN  (NITROSTAT ) 0.4 MG SL tablet Place 1 tablet (0.4 mg total) under the tongue every 5 (five) minutes as needed. 25 tablet 3   traZODone  (DESYREL ) 50 MG tablet Take 0.5-1 tablets (25-50 mg total) by mouth at bedtime. 90 tablet 1   No current facility-administered medications for this visit.    Allergies  Allergen Reactions   Ticagrelor  Shortness Of Breath   Statins Other (See Comments)    Other Reaction(s): Myalgias    Social History   Socioeconomic History   Marital status: Divorced    Spouse name: Not on file   Number of children: 2   Years of education: 9   Highest education level: 9th grade  Occupational History   Occupation: Disability    Comment: telephone lineman  Tobacco Use   Smoking status: Former    Passive exposure: Past   Smokeless tobacco: Never  Vaping Use   Vaping status: Never Used  Substance and Sexual Activity   Alcohol use: Not Currently    Comment: hx of significant alcohol abuse; sober since 2022.   Drug use: No   Sexual activity: Not Currently  Other Topics Concern   Not on file  Social History Narrative   ** Merged History Encounter **    Right handed   Drinks caffeine   Single  story   Divorced 23 years, one dog   Social Drivers of Health   Financial Resource Strain: High Risk (09/04/2023)   Overall Financial Resource Strain (CARDIA)    Difficulty of Paying Living Expenses: Very hard  Food Insecurity: Food Insecurity Present (03/09/2023)   Hunger Vital Sign    Worried About Running Out of Food in the Last Year: Sometimes true    Ran Out of Food in the Last Year: Sometimes true  Transportation Needs: Unmet Transportation Needs (03/09/2023)   PRAPARE - Administrator, Civil Service (  Medical): Yes    Lack of Transportation (Non-Medical): Yes  Physical Activity: Not on file  Stress: Not on file  Social Connections: Not on file  Intimate Partner Violence: Not on file    Family History  Problem Relation Age of Onset   Parkinson's disease Mother    Hypertension Father    Heart attack Brother 58   Dementia Maternal Grandmother    Dementia Paternal Grandmother     Review of Systems:  As stated in the HPI and otherwise negative.   BP (!) 172/100   Pulse 70   Ht 5' 10 (1.778 m)   Wt 195 lb 6.4 oz (88.6 kg)   SpO2 95%   BMI 28.04 kg/m   Physical Examination: General: Well developed, well nourished, NAD  HEENT: OP clear, mucus membranes moist  SKIN: warm, dry. No rashes. Neuro: No focal deficits  Musculoskeletal: Muscle strength 5/5 all ext  Psychiatric: Mood and affect normal  Neck: No JVD, no carotid bruits, no thyromegaly, no lymphadenopathy.  Lungs:Clear bilaterally, no wheezes, rhonci, crackles Cardiovascular: Regular rate and rhythm. No murmurs, gallops or rubs. Abdomen:Soft. Bowel sounds present. Non-tender.  Extremities: No lower extremity edema. Pulses are 2 + in the bilateral DP/PT.  EKG:  EKG is ordered today. The ekg ordered today demonstrates  EKG Interpretation Date/Time:  Thursday April 10 2024 14:28:56 EST Ventricular Rate:  70 PR Interval:  214 QRS Duration:  106 QT Interval:  382 QTC Calculation: 412 R  Axis:   -38  Text Interpretation: Sinus rhythm with 1st degree A-V block Left axis deviation When compared with ECG of 09-Apr-2024 14:27, Premature supraventricular complexes are no longer Present Confirmed by Verlin Bruckner [phone]) on 04/10/2024 2:31:19 PM   Recent Labs: 07/26/2023: Hemoglobin 14.7; Platelets 210 10/11/2023: ALT 11; BUN 25; Creatinine, Ser 0.90; Potassium 4.5; Sodium 142   Lipid Panel    Component Value Date/Time   CHOL 141 10/11/2023 1241   TRIG 151 (H) 10/11/2023 1241   HDL 53 10/11/2023 1241   CHOLHDL 2.7 10/11/2023 1241   CHOLHDL 3.9 06/02/2017 0535   VLDL 43 (H) 06/02/2017 0535   LDLCALC 62 10/11/2023 1241   Wt Readings from Last 3 Encounters:  04/10/24 195 lb 6.4 oz (88.6 kg)  12/12/23 188 lb (85.3 kg)  12/03/23 188 lb 12.8 oz (85.6 kg)    Assessment and Plan:   1. CAD with unstable angina: He has chronic angina but recently having more chest pain, at rest and with exertion. This is likely driven by his increased stress level but will given his history of CAD, will plan a cardiac cath next week. I do not have any openings in the cath lab for several weeks. I will plan his cath 04/16/24 with Dr. Jordan.    I have reviewed the risks, indications, and alternatives to cardiac catheterization, possible angioplasty, and stenting with the patient. Risks include but are not limited to bleeding, infection, vascular injury, stroke, myocardial infection, arrhythmia, kidney injury, radiation-related injury in the case of prolonged fluoroscopy use, emergency cardiac surgery, and death. The patient understands the risks of serious complication is 1-2 in 1000 with diagnostic cardiac cath and 1-2% or less with angioplasty/stenting.  -Continue ASA, Imdur , Toprol  and Zetia .  -Increase Imdur  to 30 mg po BID -He is statin intolerant  -CBC and BMET today  2. HTN: BP is elevated. Will increase Imdur  today. I think his BP is elevated due to his stress level. He is upset today  about driving into [facility]  and about his housing situation.   3. Hyperlipidemia: He is intolerant of statins. LDL 62 in May 2025. Continue Repatha  and Zetia .     4. Chronic diastolic CHF: Wt is stable. No volume overload on exam. Continue Lasix  as needed.   Labs/ tests ordered today include:   Orders Placed This Encounter  Procedures   Basic Metabolic Panel (BMET)   CBC   Referral to HRT/VAS Care Navigation   EKG 12-Lead   Disposition:   F/U with me in 12 months  Signed, Lonni Cash, MD, [facility] 04/10/2024 3:31 PM    [facility] [address] [place], [place], [place]  [address] Phone: [phone]; Fax: [phone]

## 2024-04-11 ENCOUNTER — Ambulatory Visit: Payer: Self-pay | Admitting: Cardiovascular Disease

## 2024-04-11 ENCOUNTER — Telehealth: Payer: Self-pay | Admitting: Cardiovascular Disease

## 2024-04-11 ENCOUNTER — Telehealth: Payer: Self-pay | Admitting: Licensed Clinical Social Worker

## 2024-04-11 NOTE — Progress Notes (Signed)
 Heart and Vascular Care Navigation  04/11/2024  Mitchell [name] 1961-02-27 [id number]  Reason for Referral: housing concerns   Engaged with patient by telephone for initial visit for Heart and Vascular Care Coordination.                                                                                                   Assessment:   LCSW was able to reach pt today at [phone]. Introduced self, role, reason for call. Confirmed home address, full name and DOB. Pt resides alone with his emotional support dog. He receives tree surgeon, usually doesn't have to pay for his medications. He has been living at a property owned by his brother who charged him reasonable rent. He is concerned by not having any other options within budget, as many houses require at least three months of income. He also has concerns about finances relating to medical costs of care due to the estimate received from billing for upcoming procedure.   LCSW discussed resources for assistance including housing resources through [facility] for [facility], food resources, and information about [facility] team who can help advise him on medication and medical bill affordability.   No additional questions at this time. Will f/u to check in.                                     HRT/VAS Care Coordination     Patients Home Cardiology Office [facility]   Outpatient Care Team Social Worker   Social Worker Name: Marit Lark, [place], [phone]   Living arrangements for the past 2 months Single Family Home   Lives with: Pets   Patient Current Insurance Coverage Managed Medicare   Patient Has Concern With Paying Medical Bills Yes   Patient Concerns With Medical Bills some copays are expensive   Medical Bill Referrals: may be eligible for Medicare Savings Program/Extra Help if not already enrolled   Does Patient Have Prescription Coverage? Yes   Home Assistive Devices/Equipment None       Social History:                                                                              SDOH Screenings   Food Insecurity: Food Insecurity Present (04/11/2024)  Housing: Low Risk  (04/11/2024)  Transportation Needs: No Transportation Needs (04/11/2024)  Utilities: Not At Risk (04/11/2024)  Depression (PHQ2-9): High Risk (12/03/2023)  Financial Resource Strain: High Risk (04/11/2024)  Tobacco Use: Medium Risk (04/10/2024)  Health Literacy: Adequate Health Literacy (04/11/2024)    SDOH Interventions: Financial Resources:  Financial Strain Interventions: Other (Comment) (referred to [facility] for housing list of affordable housing; provided resources for food and upcoming distributions; provided contact information for [facility]  team at [employer]) DSS for financial assistance  Food Insecurity:  Food Insecurity Interventions: [employer] Provided secretary/administrator and distribution event information)  Housing Insecurity:  Housing Interventions: Other (Comment) (pt brother is selling the property, looking for new housing)  Transportation:   Transportation Interventions: Intervention Not Indicated     Other Care Navigation Interventions:     Provided Pharmacy assistance resources  Referred to speak with Senior Resources regarding Extra Help assistance/Medicare Savings Program  Patient expressed Mental Health concerns Yes, Referred to:  n/a general stress; has emotional support animal and sees [facility] provider   Follow-up plan:   LCSW was able to send a my chart message with resources discussed. Will f/u to ensure he has used these/answer any other questions.

## 2024-04-11 NOTE — Telephone Encounter (Signed)
 Call to patient, 2 identifiers used. Patient reports he is scheduled for a left heart cath next week on 04/16/24 but his insurance is telling him his copay will be $850 which he cannot afford. He says he is willing to do a payment plan but he can only afford $25 a month. He is willing to do the test before the end of the year so he doesn't have to restart on a deductible in January, but he states he is unsure what the terms of his insurance coverage are and when his deductible restarts or if it would affect the cost of his catheterization. Forwarded to MD and to billing department for advice.

## 2024-04-11 NOTE — Telephone Encounter (Signed)
 Patient calling in to discuss the ablation. Please advise

## 2024-04-14 ENCOUNTER — Telehealth (HOSPITAL_COMMUNITY): Payer: Self-pay | Admitting: *Deleted

## 2024-04-14 DIAGNOSIS — F411 Generalized anxiety disorder: Secondary | ICD-10-CM

## 2024-04-14 MED ORDER — HYDROXYZINE HCL 25 MG PO TABS: 25.0000 mg | ORAL_TABLET | Freq: Three times a day (TID) | ORAL | 2 refills | Status: DC | PRN

## 2024-04-14 MED ORDER — ESCITALOPRAM OXALATE 5 MG PO TABS: 5.0000 mg | ORAL_TABLET | Freq: Every day | ORAL | 2 refills | Status: DC

## 2024-04-14 NOTE — Telephone Encounter (Signed)
 Pt called requesting a call from provider regarding extreme anxiety attacks. Pt would like medication to help. He says he is about to be homeless and has other stressors as well that he would like to explain to provider.    Last visit: 02/15/24 Next visit: 05/30/23

## 2024-04-14 NOTE — Telephone Encounter (Signed)
 I spoke with patient and gave him number to call for payment plan arrangements.  Number sent to patient through my chart per his request.

## 2024-04-14 NOTE — Telephone Encounter (Signed)
 Patient was contacted vie telephone for approximately 25 minutes.  Mitchell [name] had endorsed high anxiety, with multiple panic attacks. He is in danger of losing his home. His brother owns the house and reportedly is selling the house and told him he needs to be out by the end of the year. They had an agreement that he could stay in the house for the rest of his life as long as he pays the mortgage and other bills. Now Mitchell [name] is fearful that he is going to be homeless as he has no money saved up and does not have anywhere to go.  In addition to this he has ongoing heart issues with an upcoming cardiac cath. He was recommended to be hospitalized, but declined because he has no one to care for his dog.  Start Hydroxyzine  25 mg TID prn for anxiety. Start Lexapro  5 mg daily. Risks of benefits of both resolved.

## 2024-04-16 ENCOUNTER — Ambulatory Visit (HOSPITAL_COMMUNITY)
Admission: RE | Admit: 2024-04-16 | Discharge: 2024-04-16 | Disposition: A | Discharge status: Home / Self Care | Attending: Cardiology | Admitting: Cardiology

## 2024-04-16 ENCOUNTER — Encounter (HOSPITAL_COMMUNITY)
Admission: RE | Disposition: A | Discharge status: Home / Self Care | Payer: Self-pay | Source: Home / Self Care | Attending: Cardiology

## 2024-04-16 ENCOUNTER — Other Ambulatory Visit: Payer: Self-pay

## 2024-04-16 ENCOUNTER — Other Ambulatory Visit (HOSPITAL_COMMUNITY): Payer: Self-pay

## 2024-04-16 DIAGNOSIS — F32A Depression, unspecified: Secondary | ICD-10-CM | POA: Insufficient documentation

## 2024-04-16 DIAGNOSIS — Z7982 Long term (current) use of aspirin: Secondary | ICD-10-CM | POA: Diagnosis not present

## 2024-04-16 DIAGNOSIS — I2510 Atherosclerotic heart disease of native coronary artery without angina pectoris: Secondary | ICD-10-CM

## 2024-04-16 DIAGNOSIS — I110 Hypertensive heart disease with heart failure: Secondary | ICD-10-CM | POA: Insufficient documentation

## 2024-04-16 DIAGNOSIS — E785 Hyperlipidemia, unspecified: Secondary | ICD-10-CM | POA: Diagnosis not present

## 2024-04-16 DIAGNOSIS — I252 Old myocardial infarction: Secondary | ICD-10-CM | POA: Diagnosis not present

## 2024-04-16 DIAGNOSIS — R079 Chest pain, unspecified: Secondary | ICD-10-CM | POA: Diagnosis present

## 2024-04-16 DIAGNOSIS — I25110 Atherosclerotic heart disease of native coronary artery with unstable angina pectoris: Secondary | ICD-10-CM | POA: Insufficient documentation

## 2024-04-16 DIAGNOSIS — Z79899 Other long term (current) drug therapy: Secondary | ICD-10-CM | POA: Insufficient documentation

## 2024-04-16 DIAGNOSIS — Z955 Presence of coronary angioplasty implant and graft: Secondary | ICD-10-CM | POA: Insufficient documentation

## 2024-04-16 DIAGNOSIS — I5032 Chronic diastolic (congestive) heart failure: Secondary | ICD-10-CM | POA: Diagnosis not present

## 2024-04-16 DIAGNOSIS — Z8673 Personal history of transient ischemic attack (TIA), and cerebral infarction without residual deficits: Secondary | ICD-10-CM | POA: Insufficient documentation

## 2024-04-16 HISTORY — PX: LEFT HEART CATH AND CORONARY ANGIOGRAPHY: CATH118249

## 2024-04-16 SURGERY — LEFT HEART CATH AND CORONARY ANGIOGRAPHY
Anesthesia: LOCAL

## 2024-04-16 MED ORDER — IOHEXOL 350 MG/ML SOLN
INTRAVENOUS | Status: DC | PRN
  Administered 2024-04-16: 40 mL

## 2024-04-16 MED ORDER — MIDAZOLAM HCL (PF) 2 MG/2ML IJ SOLN
INTRAMUSCULAR | Status: DC | PRN
  Administered 2024-04-16: 2 mg via INTRAVENOUS

## 2024-04-16 MED ORDER — SODIUM CHLORIDE 0.9 % IV SOLN: 250.0000 mL | INTRAVENOUS | Status: DC | PRN

## 2024-04-16 MED ORDER — SODIUM CHLORIDE 0.9% FLUSH: 3.0000 mL | Freq: Two times a day (BID) | INTRAVENOUS | Status: DC

## 2024-04-16 MED ORDER — SODIUM CHLORIDE 0.9% FLUSH: 3.0000 mL | INTRAVENOUS | Status: DC | PRN

## 2024-04-16 MED ORDER — HYDRALAZINE HCL 20 MG/ML IJ SOLN: 10.0000 mg | INTRAMUSCULAR | Status: DC | PRN

## 2024-04-16 MED ORDER — ONDANSETRON HCL 4 MG/2ML IJ SOLN: 4.0000 mg | Freq: Four times a day (QID) | INTRAMUSCULAR | Status: DC | PRN

## 2024-04-16 MED ORDER — SODIUM CHLORIDE 0.9 % IV SOLN
INTRAVENOUS | Status: DC | PRN
  Administered 2024-04-16: 10 mL/h via INTRAVENOUS

## 2024-04-16 MED ORDER — FENTANYL CITRATE (PF) 100 MCG/2ML IJ SOLN
INTRAMUSCULAR | Status: DC | PRN
  Administered 2024-04-16: 25 ug via INTRAVENOUS

## 2024-04-16 MED ORDER — HEPARIN (PORCINE) IN NACL 2-0.9 UNITS/ML
INTRAMUSCULAR | Status: DC | PRN
  Administered 2024-04-16: 10 mL via INTRA_ARTERIAL

## 2024-04-16 MED ORDER — LIDOCAINE HCL (PF) 1 % IJ SOLN
INTRAMUSCULAR | Status: AC
  Filled 2024-04-16: qty 30

## 2024-04-16 MED ORDER — FREE WATER: 500.0000 mL | Freq: Once | Status: DC

## 2024-04-16 MED ORDER — HEPARIN SODIUM (PORCINE) 1000 UNIT/ML IJ SOLN
INTRAMUSCULAR | Status: DC | PRN
  Administered 2024-04-16: 4500 [IU] via INTRAVENOUS

## 2024-04-16 MED ORDER — HEPARIN (PORCINE) IN NACL 1000-0.9 UT/500ML-% IV SOLN
INTRAVENOUS | Status: DC | PRN
Start: 2024-04-16 — End: 2024-04-16
  Administered 2024-04-16: 1000 mL

## 2024-04-16 MED ORDER — VERAPAMIL HCL 2.5 MG/ML IV SOLN
INTRAVENOUS | Status: AC
Start: 2024-04-16 — End: 2024-04-16
  Filled 2024-04-16: qty 2

## 2024-04-16 MED ORDER — MIDAZOLAM HCL 2 MG/2ML IJ SOLN
INTRAMUSCULAR | Status: AC
  Filled 2024-04-16: qty 2

## 2024-04-16 MED ORDER — LIDOCAINE HCL (PF) 1 % IJ SOLN
INTRAMUSCULAR | Status: DC | PRN
Start: 2024-04-16 — End: 2024-04-16
  Administered 2024-04-16: 2 mL via INTRADERMAL

## 2024-04-16 MED ORDER — HEPARIN SODIUM (PORCINE) 1000 UNIT/ML IJ SOLN
INTRAMUSCULAR | Status: AC
  Filled 2024-04-16: qty 10

## 2024-04-16 MED ORDER — FENTANYL CITRATE (PF) 100 MCG/2ML IJ SOLN
INTRAMUSCULAR | Status: AC
  Filled 2024-04-16: qty 2

## 2024-04-16 MED ORDER — ASPIRIN 81 MG PO CHEW: 81.0000 mg | CHEWABLE_TABLET | ORAL | Status: DC

## 2024-04-16 MED ORDER — ACETAMINOPHEN 325 MG PO TABS: 650.0000 mg | ORAL_TABLET | ORAL | Status: DC | PRN

## 2024-04-16 SURGICAL SUPPLY — 7 items
CATH 5FR JL3.5 JR4 ANG PIG MP (CATHETERS) IMPLANT
DEVICE RAD COMP TR BAND LRG (VASCULAR PRODUCTS) IMPLANT
GLIDESHEATH SLEND SS 6F .021 (SHEATH) IMPLANT
GUIDEWIRE INQWIRE 1.5J.035X260 (WIRE) IMPLANT
PACK CARDIAC CATHETERIZATION (CUSTOM PROCEDURE TRAY) ×2 IMPLANT
SET ATX-X65L (MISCELLANEOUS) IMPLANT
SHEATH PROBE COVER 6X72 (BAG) IMPLANT

## 2024-04-16 NOTE — Interval H&P Note (Signed)
 History and Physical Interval Note:  04/16/2024 10:02 AM  Mitchell [name]  has presented today for surgery, with the diagnosis of chest pain - cad.  The various methods of treatment have been discussed with the patient and family. After consideration of risks, benefits and other options for treatment, the patient has consented to  Procedure(s): LEFT HEART CATH AND CORONARY ANGIOGRAPHY (N/A) as a surgical intervention.  The patient's history has been reviewed, patient examined, no change in status, stable for surgery.  I have reviewed the patient's chart and labs.  Questions were answered to the patient's satisfaction.   Cath Lab Visit (complete for each Cath Lab visit)  Clinical Evaluation Leading to the Procedure:   ACS: Yes.    Non-ACS:    Anginal Classification: CCS III  Anti-ischemic medical therapy: Maximal Therapy (2 or more classes of medications)  Non-Invasive Test Results: No non-invasive testing performed  Prior CABG: No previous CABG        [facility] 04/16/2024 10:02 AM

## 2024-04-16 NOTE — Progress Notes (Signed)
 Patient and patient son given discharge instructions (patient son via telephone), education provided no further questions at this time. Patient able to ambulate and void before discharge. Able to tolerate PO intake. Patient site is clean, dry, intact with no hematoma noted upon discharge.

## 2024-04-19 ENCOUNTER — Encounter (HOSPITAL_COMMUNITY): Payer: Self-pay | Admitting: Cardiology

## 2024-04-30 ENCOUNTER — Telehealth: Payer: Self-pay | Admitting: Licensed Clinical Social Worker

## 2024-04-30 NOTE — Telephone Encounter (Signed)
 H&V Care Navigation CSW Progress Note  Clinical Social Worker contacted patient by phone to f/u on resources sent to pt. Also had sent a myChart which was read but not responded to. No answer today at [phone]. Attempted to leave voicemail but mailbox full at this time. Remain available should pt re-engage with care navigation team/seek additional resources.  Patient is participating in a Managed Medicaid Plan:  No, [employer] Medicare  SDOH Screenings   Food Insecurity: Food Insecurity Present (04/11/2024)  Housing: Low Risk  (04/11/2024)  Transportation Needs: No Transportation Needs (04/11/2024)  Utilities: Not At Risk (04/11/2024)  Depression (PHQ2-9): High Risk (12/03/2023)  Financial Resource Strain: High Risk (04/11/2024)  Tobacco Use: Medium Risk (04/10/2024)  Health Literacy: Adequate Health Literacy (04/11/2024)    Mitchell [name], MSW, LCSW Clinical Social Worker II [facility] Heart/Vascular Care Navigation  [phone]- work cell phone (preferred)

## 2024-05-09 ENCOUNTER — Encounter: Payer: Self-pay | Admitting: Nurse Practitioner

## 2024-05-09 ENCOUNTER — Other Ambulatory Visit: Payer: Self-pay | Admitting: Family Medicine

## 2024-05-09 ENCOUNTER — Ambulatory Visit: Admitting: Pulmonary Disease

## 2024-05-09 ENCOUNTER — Ambulatory Visit: Attending: Nurse Practitioner | Admitting: Nurse Practitioner

## 2024-05-09 VITALS — BP 137/86 | HR 75 | Resp 16 | Ht 70.0 in | Wt 199.8 lb

## 2024-05-09 DIAGNOSIS — I5032 Chronic diastolic (congestive) heart failure: Secondary | ICD-10-CM

## 2024-05-09 DIAGNOSIS — I493 Ventricular premature depolarization: Secondary | ICD-10-CM

## 2024-05-09 DIAGNOSIS — E785 Hyperlipidemia, unspecified: Secondary | ICD-10-CM

## 2024-05-09 DIAGNOSIS — Z8673 Personal history of transient ischemic attack (TIA), and cerebral infarction without residual deficits: Secondary | ICD-10-CM

## 2024-05-09 DIAGNOSIS — I4729 Other ventricular tachycardia: Secondary | ICD-10-CM | POA: Diagnosis not present

## 2024-05-09 DIAGNOSIS — I2510 Atherosclerotic heart disease of native coronary artery without angina pectoris: Secondary | ICD-10-CM | POA: Diagnosis not present

## 2024-05-09 DIAGNOSIS — I77810 Thoracic aortic ectasia: Secondary | ICD-10-CM

## 2024-05-09 DIAGNOSIS — I10 Essential (primary) hypertension: Secondary | ICD-10-CM

## 2024-05-09 NOTE — Patient Instructions (Signed)
 Medication Instructions:  Your physician recommends that you continue on your current medications as directed. Please refer to the Current Medication list given to you today.  *If you need a refill on your cardiac medications before your next appointment, please call your pharmacy*   Follow-Up: At [facility], you and your health needs are our priority.  As part of our continuing mission to provide you with exceptional heart care, our providers are all part of one team.  This team includes your primary Cardiologist (physician) and Advanced Practice Providers or APPs (Physician Assistants and Nurse Practitioners) who all work together to provide you with the care you need, when you need it.  Your next appointment:   6 month(s)  Provider:   Lonni Cash, MD    We recommend signing up for the patient portal called MyChart.  Sign up information is provided on this After Visit Summary.  MyChart is used to connect with patients for Virtual Visits (Telemedicine).  Patients are able to view lab/test results, encounter notes, upcoming appointments, etc.  Non-urgent messages can be sent to your provider as well.   To learn more about what you can do with MyChart, go to [email or web address].

## 2024-05-09 NOTE — Progress Notes (Signed)
 "  Office Visit    Patient Name: Mitchell [name] Date of Encounter: 05/09/2024  Primary Care Provider:  Chandra Toribio POUR, MD Primary Cardiologist:  Lonni Cash, MD  Chief Complaint    63 year old male with a history of CAD s/p DES-RCA in 2015, chronic HFpEF, NSVT, PVCs, dilation of ascending aorta, hypertension, hyperlipidemia, CVA, anxiety, depression, and prior alcohol use disorde who presents for follow-up related to CAD s/p cardiac catheterization.  Past Medical History    Past Medical History:  Diagnosis Date   Alcohol dependence in sustained full remission    Sober for past 1 year (05/02/22)   Benign essential hypertension 12/07/2020   Chest pain 06/01/2017   Chronic low back pain 03/11/2014   Constipation 03/06/2014   Coronary artery disease of native artery of native heart with stable angina pectoris 03/04/2014   a. 02/2014 Inf STEMI/PCI: LM nl, LAD min irregs, Diags 1/2/3 min irregs, LCX min irregs, OM1/2/3 min irregs, RI min irregs, RCA 100p (3.5x18 Xience DES), PDA/PL nl, EF 45-50% basal inf HK.   Dizziness, nonspecific 01/10/2021   Generalized anxiety disorder 03/04/2014   History of acute inferior wall MI 02/2014   Tx with DES to RCA   History of COVID-19 06/2018   Hyperlipidemia    Intolerance of drug    dyspnea with Brilinta  >> changed to Effient     Ischemic cardiomyopathy    a. EF 45-50% with inf HK at Inf STEMI // b. Echo in 10/15 with EF 55-60% with inf and inf-lat HK   Major depressive disorder    Mild cognitive impairment of uncertain or unknown etiology 05/02/2022   NSVT (nonsustained ventricular tachycardia)    a. in the setting of inferior STEMI;  b. 02/2014 Echo: EF 55-60%, Gr 1 DD, basal-midinferolateral/inf HK, Gr 1 DD, triv TR.   Statin intolerance 01/09/2021   Status post coronary artery stent placement    inferior STEMI 02/2014 with 100% RCA, (single vessel disease) stented with Xience DES, EF 55-60%.  Patent stent by cath 12/08/2020.    TIA (transient ischemic attack) 01/12/2017   Ventricular ectopy 12/07/2020   Past Surgical History:  Procedure Laterality Date   CARDIAC CATHETERIZATION     02/28/2014 prox RCA single vessel occlusion treated with DES   CORONARY ANGIOPLASTY WITH STENT PLACEMENT     LEFT HEART CATH Bilateral 02/28/2014   Procedure: LEFT HEART CATH;  Surgeon: Deatrice DELENA Cage, MD;  Location: [facility] CATH LAB;  Service: Cardiovascular;  Laterality: Bilateral;   LEFT HEART CATH AND CORONARY ANGIOGRAPHY N/A 04/16/2024   Procedure: LEFT HEART CATH AND CORONARY ANGIOGRAPHY;  Surgeon: Jordan, Peter M, MD;  Location: [facility] INVASIVE CV LAB;  Service: Cardiovascular;  Laterality: N/A;   TONSILLECTOMY     WRIST SURGERY      Allergies  Allergies[1]   Labs/Other Studies Reviewed    The following studies were reviewed today:  Cardiac Studies & Procedures   ______________________________________________________________________________________________ CARDIAC CATHETERIZATION  CARDIAC CATHETERIZATION 04/16/2024  Conclusion   Prox LAD lesion is 30% stenosed.   Prox RCA lesion is 25% stenosed.   The left ventricular systolic function is normal.   LV end diastolic pressure is normal.   The left ventricular ejection fraction is 55-65% by visual estimate.  Mild nonobstructive CAD Normal LV function Normal LVEDP  Plan: continue medical therapy. Consider other causes of chest pain.  Findings Coronary Findings Diagnostic  Dominance: Right  Left Main Vessel was injected. Vessel is normal in caliber. Vessel is angiographically normal.  Left  Anterior Descending Prox LAD lesion is 30% stenosed.  Ramus Intermedius Vessel was injected. Vessel is normal in caliber. Vessel is angiographically normal.  Left Circumflex Vessel was injected. Vessel is normal in caliber. Vessel is angiographically normal.  Right Coronary Artery Vessel is large. Prox RCA lesion is 25% stenosed. The lesion was previously treated  .  Intervention  No interventions have been documented.   STRESS TESTS  NM MYOCAR MULTI W/SPECT W 06/02/2017  Narrative  There was no ST segment deviation noted during stress.  Defect 1: There is a large defect of severe severity present in the basal inferolateral, mid inferolateral and apex location.  This is a low risk study.  Findings consistent with prior myocardial infarction with peri-infarct ischemia.  The left ventricular ejection fraction is mildly decreased (45-54%).  Abnormal, low risk stress nuclear study with prior inferolateral infarct and trivial peri-infarct ischemia towards the apex; EF 48 with hypokinesis of the inferolateral wall; mild LVE.   ECHOCARDIOGRAM  ECHOCARDIOGRAM COMPLETE 03/13/2024  Narrative ECHOCARDIOGRAM REPORT    Patient Name:   Mitchell [name] Date of Exam: 03/13/2024 Medical Rec #:  [id number]         Height:       70.0 in Accession #:    [id number]        Weight:       188.0 lb Date of Birth:  May 02, 1961        BSA:          2.033 m Patient Age:    62 years          BP:           140/70 mmHg Patient Gender: M                 HR:           79 bpm. Exam Location:  Outpatient  Procedure: 2D Echo, 3D Echo, Cardiac Doppler, Color Doppler and Strain Analysis (Both Spectral and Color Flow Doppler were utilized during procedure).  Indications:    Stoke  History:        Patient has prior history of Echocardiogram examinations, most recent 12/07/2020. TIA, Arrythmias:PVC; Risk Factors:Former Smoker, Hypertension and Dyslipidemia. NSVT.  Sonographer:    Orvil Holmes RDCS Referring Phys: [id number] SARA E [facility]  IMPRESSIONS   1. Left ventricular ejection fraction, by estimation, is 60 to 65%. Left ventricular ejection fraction by 3D volume is 62 %. The left ventricle has normal function. The left ventricle has no regional wall motion abnormalities. Left ventricular diastolic parameters were normal. The average left ventricular global  longitudinal strain is -19.0 %. The global longitudinal strain is normal. 2. Right ventricular systolic function is normal. The right ventricular size is normal. 3. Left atrial size was mildly dilated. 4. Right atrial size was mildly dilated. 5. The mitral valve is normal in structure. No evidence of mitral valve regurgitation. No evidence of mitral stenosis. 6. The aortic valve is tricuspid. Aortic valve regurgitation is trivial. No aortic stenosis is present. 7. Aortic dilatation noted. There is mild dilatation of the aortic root, measuring 40 mm. There is mild dilatation of the ascending aorta, measuring 39 mm. 8. The inferior vena cava is normal in size with greater than 50% respiratory variability, suggesting right atrial pressure of 3 mmHg.  Comparison(s): A prior study was performed on 12/07/2020 [facility]. 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 No shunts seen by color Doppler on the  current study and saline contrast injection was not performed.  Conclusion(s)/Recommendation(s): Consider TEE to evaluate for possible PFO associated with the atrial septal aneurysm, especially if the patient is a candidate for device closure.  FINDINGS Left Ventricle: Left ventricular ejection fraction, by estimation, is 60 to 65%. Left ventricular ejection fraction by 3D volume is 62 %. The left ventricle has normal function. The left ventricle has no regional wall motion abnormalities. The average left ventricular global longitudinal strain is -19.0 %. Strain was performed and the global longitudinal strain is normal. The left ventricular internal cavity size was normal in size. There is no left ventricular hypertrophy. Left ventricular diastolic parameters were normal.  Right Ventricle: The right ventricular size is normal. No increase in right ventricular wall thickness. Right ventricular systolic function is normal.  Left  [facility]: Left atrial size was mildly dilated.  Right [facility]: Right atrial size was mildly dilated.  Pericardium: There is no evidence of pericardial effusion.  Mitral Valve: The mitral valve is normal in structure. No evidence of mitral valve regurgitation. No evidence of mitral valve stenosis.  Tricuspid Valve: The tricuspid valve is normal in structure. Tricuspid valve regurgitation is trivial.  Aortic Valve: The aortic valve is tricuspid. Aortic valve regurgitation is trivial. Aortic regurgitation PHT measures 819 msec. No aortic stenosis is present.  Pulmonic Valve: The pulmonic valve was not well visualized. Pulmonic valve regurgitation is not visualized. No evidence of pulmonic stenosis.  Aorta: Aortic dilatation noted. There is mild dilatation of the aortic root, measuring 40 mm. There is mild dilatation of the ascending aorta, measuring 39 mm.  Venous: The inferior vena cava is normal in size with greater than 50% respiratory variability, suggesting right atrial pressure of 3 mmHg.  IAS/Shunts: No atrial level shunt detected by color flow Doppler.  Additional Comments: 3D was performed not requiring image post processing on an independent workstation and was normal.   LEFT VENTRICLE PLAX 2D LVIDd:         5.29 cm         Diastology LVIDs:         2.90 cm         LV e' medial:    8.05 cm/s LV PW:         1.05 cm         LV E/e' medial:  6.8 LV IVS:        0.92 cm         LV e' lateral:   12.60 cm/s LVOT diam:     2.00 cm         LV E/e' lateral: 4.3 LV SV:         81 LV SV Index:   40              2D Longitudinal LVOT Area:     3.14 cm        Strain 2D Strain GLS   -19.0 % Avg:  3D Volume EF LV 3D EF:    Left ventricul ar ejection fraction by 3D volume is 62 %.  3D Volume EF: 3D EF:        62 % LV EDV:       151 ml LV ESV:       57 ml LV SV:        94 ml  RIGHT VENTRICLE RV Basal diam:  4.25 cm     PULMONARY VEINS RV Mid diam:    3.49 cm  A Reversal  Velocity: 52.60 cm/s RV S prime:     14.00 cm/s  Diastolic Velocity:  63.90 cm/s TAPSE (M-mode): 2.9 cm      S/D Velocity:        1.30 Systolic Velocity:   85.30 cm/s  LEFT ATRIUM             Index        RIGHT ATRIUM           Index LA diam:        4.40 cm 2.16 cm/m   RA Area:     19.70 cm LA Vol (A2C):   69.5 ml 34.18 ml/m  RA Volume:   50.70 ml  24.94 ml/m LA Vol (A4C):   52.9 ml 26.02 ml/m LA Biplane Vol: 64.0 ml 31.48 ml/m AORTIC VALVE LVOT Vmax:   131.00 cm/s LVOT Vmean:  81.400 cm/s LVOT VTI:    0.257 m AI PHT:      819 msec  AORTA Ao Root diam: 4.00 cm Ao Asc diam:  3.90 cm  MITRAL VALVE               TRICUSPID VALVE MV Area (PHT): 2.76 cm    TR Peak grad:   14.0 mmHg MV Decel Time: 275 msec    TR Vmax:        187.00 cm/s MV E velocity: 54.40 cm/s MV A velocity: 54.40 cm/s  SHUNTS MV E/A ratio:  1.00        Systemic VTI:  0.26 m Systemic Diam: 2.00 cm  Jerel Croitoru MD Electronically signed by Jerel Balding MD Signature Date/Time: 03/13/2024/3:37:29 PM    Final          ______________________________________________________________________________________________     Recent Labs: 10/11/2023: ALT 11 04/10/2024: BUN 23; Creatinine, Ser 0.80; Hemoglobin 14.3; Platelets 244; Potassium 4.5; Sodium 139  Recent Lipid Panel    Component Value Date/Time   CHOL 141 10/11/2023 1241   TRIG 151 (H) 10/11/2023 1241   HDL 53 10/11/2023 1241   CHOLHDL 2.7 10/11/2023 1241   CHOLHDL 3.9 06/02/2017 0535   VLDL 43 (H) 06/02/2017 0535   LDLCALC 62 10/11/2023 1241    History of Present Illness    63 year old male with the above past medical history including CAD s/p DES-RCA in 2015, chronic HFpEF, NSVT, PVCs, dilation of ascending aorta, hypertension, hyperlipidemia, CVA, anxiety, depression, and prior alcohol use disorder.  Previously followed by Dr. Rolan and Dr. Balding.  He later established with Dr. Verlin in 2024.  History of CAD, s/p DES-RCA in 2015.   Echocardiogram in 2015 showed EF 55 to 60%.  He was hospitalized in 2019 in the setting of chest pain.  Nuclear stress test showed no evidence of ischemia, prior infarction.  He was hospitalized in July 2022 at [facility] in the setting of chest pain.  Echocardiogram showed EF 55 to 60%.  Cardiac catheterization in July 2022 at [facility] showed mild disease in the LAD, LCx, patent RCA stent.  Of note, he has a history of statin intolerance, he had dyspnea with Brilinta .  He has had chronic chest pain.  Echocardiogram in 02/2024 showed EF 60 to 65%, normal LV function, no RWMA, normal RV systolic function, no significant valvular abnormalities, mild dilation of the aortic root measuring 40 mm, mild dilation of ascending aorta measuring 39 mm.  He was last seen in the office on 04/10/2024 and reported more frequent chest pain.  Cardiac catheterization on 04/16/2024  revealed 30% proximal LAD stenosis, 25% proximal RCA ISR, mild nonobstructive CAD, normal LV function, normal LVEDP. Ongoing medical therapy was advised.  He presents today for follow-up. Since his last visit and since his procedure has been stable from a cardiac standpoint.  He has noticed improvement in his chest pain with increased Imdur  dosing.  He denies any worsening dyspnea, denies any palpitations, dizziness, edema, PND, orthopnea, weight gain.  Overall, he reports feeling well.    Home Medications    Current Outpatient Medications  Medication Sig Dispense Refill   aspirin  EC 81 MG tablet Take 81 mg by mouth daily.     escitalopram  (LEXAPRO ) 5 MG tablet Take 1 tablet (5 mg total) by mouth daily. 30 tablet 2   Evolocumab  (REPATHA  SURECLICK) 140 MG/ML SOAJ Inject 140 mg into the skin every 14 (fourteen) days. 6 mL 3   ezetimibe  (ZETIA ) 10 MG tablet Take 1 tablet (10 mg total) by mouth daily. 90 tablet 3   furosemide  (LASIX ) 40 MG tablet Take 1 tablet (40 mg total) by mouth daily as needed. 90 tablet 3   hydrOXYzine   (ATARAX ) 25 MG tablet Take 1 tablet (25 mg total) by mouth 3 (three) times daily as needed. 90 tablet 2   isosorbide  mononitrate (IMDUR ) 30 MG 24 hr tablet Take 1 tablet (30 mg total) by mouth 2 (two) times daily. 180 tablet 2   losartan  (COZAAR ) 50 MG tablet Take 1 tablet (50 mg total) by mouth daily. 90 tablet 2   magnesium  oxide (MAG-OX) 400 (240 Mg) MG tablet TAKE 1 TABLET EVERY DAY 90 tablet 3   metoprolol  succinate (TOPROL -XL) 50 MG 24 hr tablet Take 1 tablet (50 mg total) by mouth daily. Take with or immediately following a meal. 90 tablet 3   nitroGLYCERIN  (NITROSTAT ) 0.4 MG SL tablet Place 1 tablet (0.4 mg total) under the tongue every 5 (five) minutes as needed. 25 tablet 3   traZODone  (DESYREL ) 50 MG tablet Take 0.5-1 tablets (25-50 mg total) by mouth at bedtime. (Patient taking differently: Take 25-50 mg by mouth at bedtime as needed for sleep.) 90 tablet 1   No current facility-administered medications for this visit.     Review of Systems    He denies palpitations, dyspnea, pnd, orthopnea, n, v, dizziness, syncope, edema, weight gain, or early satiety. All other systems reviewed and are otherwise negative except as noted above.   Physical Exam    VS:  BP 137/86 (BP Location: Left Arm, Patient Position: Sitting, Cuff Size: Normal)   Pulse 75   Resp 16   Ht 5' 10 (1.778 m)   Wt 199 lb 12.8 oz (90.6 kg)   SpO2 96%   BMI 28.67 kg/m  GEN: Well nourished, well developed, in no acute distress. HEENT: normal. Neck: Supple, no JVD, carotid bruits, or masses. Cardiac: RRR, no murmurs, rubs, or gallops. No clubbing, cyanosis, edema.  Radials/DP/PT 2+ and equal bilaterally.  Respiratory:  Respirations regular and unlabored, clear to auscultation bilaterally. GI: Soft, nontender, nondistended, BS + x 4. MS: no deformity or atrophy. Skin: warm and dry, no rash. Neuro:  Strength and sensation are intact. Psych: Normal affect.  Accessory Clinical Findings    ECG personally  reviewed by me today -    - no EKG in office today.    Lab Results  Component Value Date   WBC 5.4 04/10/2024   HGB 14.3 04/10/2024   HCT 42.9 04/10/2024   MCV 102 (H) 04/10/2024   PLT 244  04/10/2024   Lab Results  Component Value Date   CREATININE 0.80 04/10/2024   BUN 23 04/10/2024   NA 139 04/10/2024   K 4.5 04/10/2024   CL 105 04/10/2024   CO2 20 04/10/2024   Lab Results  Component Value Date   ALT 11 10/11/2023   AST 19 10/11/2023   ALKPHOS 140 (H) 10/11/2023   BILITOT 0.3 10/11/2023   Lab Results  Component Value Date   CHOL 141 10/11/2023   HDL 53 10/11/2023   LDLCALC 62 10/11/2023   TRIG 151 (H) 10/11/2023   CHOLHDL 2.7 10/11/2023    Lab Results  Component Value Date   HGBA1C 5.5 07/26/2023    Assessment & Plan   1. CAD:  S/p DES-RCA in 2015. Cardiac catheterization on 04/16/2024 the setting of increased chest discomfort revealed 30% proximal LAD stenosis, 25% proximal RCA ISR, mild nonobstructive CAD, normal LV function, normal LVEDP.  Ongoing medical therapy was advised. Continue aspirin , metoprolol , losartan , Imdur , Zetia , Repatha .  2. Chronic HFpEF: Echocardiogram in 02/2024 showed EF 60 to 65%, normal LV function, no RWMA, normal RV systolic function, no significant valvular abnormalities, mild dilation of the aortic root measuring 40 mm, mild dilation of ascending aorta measuring 39 mm. Euvolemic and well compensated on exam. Continue Lasix .  3. NSVT/PVCs: Denies any recent palpitations. Continue metoprolol .  4. Dilation of ascending aorta: Mild on most recent echocardiogram. Continue repeat echocardiogram versus CT chest/aorta for repeat monitoring in 1 year.  5. Hypertension: BP well controlled. Continue current antihypertensive regimen.   6. Hyperlipidemia: LDL was 62 in 09/2023. Continue Zetia , Repatha .  7. History of CVA: No residual. Continue aspirin , Zetia , Repatha .  8. Disposition: Follow-up in 6 months, sooner if needed.       Damien JAYSON Braver, NP 05/09/2024, 2:31 PM       [1]  Allergies Allergen Reactions   Ticagrelor  Shortness Of Breath   Other    Statins Other (See Comments)    Other Reaction(s): Myalgias   "

## 2024-05-26 NOTE — Progress Notes (Signed)
 BH MD/PA/NP OP Progress Note  05/29/2024 9:14 AM Mitchell [name]  MRN:  [id number]  Visit Diagnosis:    ICD-10-CM   1. Generalized anxiety disorder  F41.1 escitalopram  (LEXAPRO ) 5 MG tablet    hydrOXYzine  (ATARAX ) 25 MG tablet      Assessment: Mitchell [name] is a 64 y.o. male with a history of hypertension, hyperlipidemia, TIA 2018 (with L arm numbness blurred vision with neg MRA/MRI except for minimal SVD), heat stroke in 2012, CAD s/p MI, ICM, anxiety, depression, prior alcohol abuse in sustained remission, strong family history of Alzheimer's diseasewho presented to [facility] at [facility] for initial evaluation on 07/18/2022.  During initial evaluation patient reported neurovegetative symptoms of depression including low mood, insomnia, worthlessness, negative self thoughts, fatigue, difficulty concentrating, and passive SI without intent or plan.  He also endorsed significant symptoms of anxiety including constant worry that he is unable to control, difficulty relaxing, restlessness, and increased irritability.  Of note patient had been having memory issues such as forgetting to turn off the stove or water .  Neuropsych testing was performed which showed mild cognitive impairment of unclear etiology, with depression/anxiety being possible causes.  Neuro workup was negative on EEG and MRI showed only minimal progression in small vessel ischemic changes that is less likely to be consistent with the severity and dropping his cognition/memory. Psychosocially patient is struggling with increased isolation and financial stressors.  He met criteria for MDD and generalized anxiety disorder.  Mitchell [name] presents for follow-up evaluation. Today, 05/29/2024, patient is euthymic though had reached out in the interim with increased anxiety and depression.  This was in the context of psychosocial stressors and loss of housing.  Stressors have been partially resolved and patient  is not homeless.  Was started on Lexapro  and hydroxyzine  when he reach out in the interim which patient is been taking with good effect.  He denies any adverse side effects.  Will continue on the current regimen and follow-up in 2 to 3 months.  Of note patient had MRI that showed chronic/subcortical infarct in the right frontal operculum and insula that was new from prior MRI in 2023.  He had endorsed symptoms consistent with these findings back in the spring.  Recommended that he follow-up with his neurologist to discuss this further.  Psychotherapeutic interventions were used during today's session. From 8:35 AM to 8:51 AM . Therapeutic interventions included empathic listening and supportive therapy. Used supportive interviewing techniques to provide emotional validation. Improvement was evidenced by patient's participation and identified commitment to therapy goals.    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 - EEG WNL - Crisis resources reviewed - Follow up in 5-6 months  Chief Complaint:  Chief Complaint  Patient presents with   Follow-up   HPI: Mitchell [name] presents today reporting that he is doing much better than when he reached out in the interim.  His brother did end up selling the house when he came time to move out the brought a moving trailer to help Mitchell [name] pack up before  moving in to his garage.  While it is not the most ideal situation it is much better than the expected homelessness that he thought he was  facing.  Now patient is able to save up some money living with his brother and hopefully move out in the next few months.  He has also talked some with his son about the idea of combining incomes and getting a place together.  Mitchell [name] did express some frustration with the constant dealing with the unknown.  He wants to just get settled somewhere and be able to live his life in a stable manner.  Empathic listening techniques were used and support was provided.  Patient reports that he is getting along with his brother okay and is happy that the bridge between them was not burned.  Outside of his brother and his sons he does not have an interaction with others.  Patient did get to go see his kids and granddaughter for Christmas which he really enjoyed.  Patient started the medication that was prescribed in the interim and reports good effect.  The combination of Lexapro  and hydroxyzine  has helped to lift his melancholic mood and he has been happier day-to-day.  He is also no longer experiencing the extreme anxiety and panic that he had previously.  Patient had been taking hydroxyzine  3 times a day and is now down to once a day as things have stabilized.  With the initiation of hydroxyzine  he has stopped the trazodone  as he has been sleeping well through the night without it.  Past Psychiatric History:  Patient denies any psychiatric care other than a psychiatrist at church once after his divorce who started on Zoloft for a month.  He denies prior suicide attempts or psychiatric hospitalizations.  Has been on Zoloft, Prozac  (fatigue), Effexor  (headaches and irritability), Xanax  and Ambien  in the past.  Started  trazodone  on 07/18/2022.  Hx of alcohol use been in remission for 2 years. Does endorse cravings. Used to smoke marijuana several decades ago. Denies any other substance use.   Past Medical History:  Past Medical History:  Diagnosis Date   Alcohol dependence in sustained full remission    Sober  for past 1 year (05/02/22)   Benign essential hypertension 12/07/2020   Chest pain 06/01/2017   Chronic low back pain 03/11/2014   Constipation 03/06/2014   Coronary artery disease of native artery of native heart with stable angina pectoris 03/04/2014   a. 02/2014 Inf STEMI/PCI: LM nl, LAD min irregs, Diags 1/2/3 min irregs, LCX min irregs, OM1/2/3 min irregs, RI min irregs, RCA 100p (3.5x18 Xience DES), PDA/PL nl, EF 45-50% basal inf HK.   Dizziness, nonspecific 01/10/2021   Generalized anxiety disorder 03/04/2014   History of acute inferior wall MI 02/2014   Tx with DES to RCA   History of COVID-19 06/2018   Hyperlipidemia    Intolerance of drug    dyspnea with Brilinta  >> changed to Effient     Ischemic cardiomyopathy    a. EF 45-50% with inf HK at Inf STEMI // b. Echo in 10/15 with EF 55-60% with inf and inf-lat HK   Major depressive disorder    Mild cognitive impairment of uncertain or unknown etiology 05/02/2022   NSVT (nonsustained ventricular tachycardia)    a. in the setting of inferior STEMI;  b. 02/2014 Echo: EF 55-60%, Gr 1 DD, basal-midinferolateral/inf HK, Gr 1 DD, triv TR.   Statin intolerance 01/09/2021   Status post coronary artery stent placement  inferior STEMI 02/2014 with 100% RCA, (single vessel disease) stented with Xience DES, EF 55-60%.  Patent stent by cath 12/08/2020.   TIA (transient ischemic attack) 01/12/2017   Ventricular ectopy 12/07/2020    Past Surgical History:  Procedure Laterality Date   CARDIAC CATHETERIZATION     02/28/2014 prox RCA single vessel occlusion treated with DES   CORONARY ANGIOPLASTY WITH STENT PLACEMENT     LEFT HEART CATH Bilateral 02/28/2014   Procedure: LEFT HEART CATH;  Surgeon: Deatrice DELENA Cage, MD;  Location: [facility] CATH LAB;  Service: Cardiovascular;  Laterality: Bilateral;   LEFT HEART CATH AND CORONARY ANGIOGRAPHY N/A 04/16/2024   Procedure: LEFT HEART CATH AND CORONARY ANGIOGRAPHY;  Surgeon: Jordan, Peter M, MD;  Location:  [facility] INVASIVE CV LAB;  Service: Cardiovascular;  Laterality: N/A;   TONSILLECTOMY     WRIST SURGERY      Family History:  Family History  Problem Relation Age of Onset   Parkinson's disease Mother    Hypertension Father    Heart attack Brother 70   Dementia Maternal Grandmother    Dementia Paternal Grandmother     Social History:  Social History   Socioeconomic History   Marital status: Divorced    Spouse name: Not on file   Number of children: 2   Years of education: 9   Highest education level: 9th grade  Occupational History   Occupation: Disability    Comment: telephone lineman  Tobacco Use   Smoking status: Former    Passive exposure: Past   Smokeless tobacco: Never  Vaping Use   Vaping status: Never Used  Substance and Sexual Activity   Alcohol use: Not Currently    Comment: hx of significant alcohol abuse; sober since 2022.   Drug use: No   Sexual activity: Not Currently  Other Topics Concern   Not on file  Social History Narrative   ** Merged History Encounter **    Right handed   Drinks caffeine   Single story   Divorced 23 years, one dog   Social Drivers of Health   Tobacco Use: Medium Risk (05/29/2024)   Patient History    Smoking Tobacco Use: Former    Smokeless Tobacco Use: Never    Passive Exposure: Past  Physicist, Medical Strain: High Risk (04/11/2024)   Overall Financial Resource Strain (CARDIA)    Difficulty of Paying Living Expenses: Hard  Food Insecurity: Food Insecurity Present (04/11/2024)   Epic    Worried About Programme Researcher, Broadcasting/film/video in the Last Year: Sometimes true    Ran Out of Food in the Last Year: Sometimes true  Transportation Needs: No Transportation Needs (04/11/2024)   Epic    Lack of Transportation (Medical): No    Lack of Transportation (Non-Medical): No  Physical Activity: Not on file  Stress: Not on file  Social Connections: Not on file  Depression (PHQ2-9): High Risk (12/03/2023)   Depression (PHQ2-9)    PHQ-2 Score:  19  Alcohol Screen: Not on file  Housing: Low Risk (04/11/2024)   Epic    Unable to Pay for Housing in the Last Year: No    Number of Times Moved in the Last Year: 0    Homeless in the Last Year: No  Utilities: Not At Risk (04/11/2024)   Epic    Threatened with loss of utilities: No  Health Literacy: Adequate Health Literacy (04/11/2024)   B1300 Health Literacy    Frequency of need for help with medical instructions: Rarely  Allergies:  Allergies  Allergen Reactions   Ticagrelor  Shortness Of Breath   Other    Statins Other (See Comments)    Other Reaction(s): Myalgias    Current Medications: Current Outpatient Medications  Medication Sig Dispense Refill   aspirin  EC 81 MG tablet Take 81 mg by mouth daily.     escitalopram  (LEXAPRO ) 5 MG tablet Take 1 tablet (5 mg total) by mouth daily. 90 tablet 0   Evolocumab  (REPATHA  SURECLICK) 140 MG/ML SOAJ Inject 140 mg into the skin every 14 (fourteen) days. 6 mL 3   ezetimibe  (ZETIA ) 10 MG tablet Take 1 tablet (10 mg total) by mouth daily. 90 tablet 3   furosemide  (LASIX ) 40 MG tablet Take 1 tablet (40 mg total) by mouth daily as needed. 90 tablet 3   hydrOXYzine  (ATARAX ) 25 MG tablet Take 1 tablet (25 mg total) by mouth 3 (three) times daily as needed. 270 tablet 0   isosorbide  mononitrate (IMDUR ) 30 MG 24 hr tablet Take 1 tablet (30 mg total) by mouth 2 (two) times daily. 180 tablet 2   losartan  (COZAAR ) 50 MG tablet Take 1 tablet (50 mg total) by mouth daily. 90 tablet 2   magnesium  oxide (MAG-OX) 400 (240 Mg) MG tablet TAKE 1 TABLET EVERY DAY 90 tablet 3   metoprolol  succinate (TOPROL -XL) 50 MG 24 hr tablet Take 1 tablet (50 mg total) by mouth daily. Take with or immediately following a meal. 90 tablet 3   nitroGLYCERIN  (NITROSTAT ) 0.4 MG SL tablet Place 1 tablet (0.4 mg total) under the tongue every 5 (five) minutes as needed. 25 tablet 3   No current facility-administered medications for this visit.     Psychiatric Specialty  Exam: Review of Systems  There were no vitals taken for this visit.There is no height or weight on file to calculate BMI.  General Appearance: Well Groomed  Eye Contact:  Good  Speech:  Clear and Coherent  Volume:  Normal  Mood:  Anxious and Depressed  Affect:  Congruent  Thought Process:  Coherent and Linear  Orientation:  Full (Time, Place, and Person)  Thought Content: Logical   Suicidal Thoughts:  No  Homicidal Thoughts:  No  Memory:  Immediate;   Poor  Judgement:  Good  Insight:  Fair  Psychomotor Activity:  Normal  Concentration:  Concentration: Fair  Recall:  Fair  Fund of Knowledge: Fair  Language: Good  Akathisia:  NA    AIMS (if indicated): not done  Assets:  Communication Skills Housing  ADL's:  Intact  Cognition: WNL  Sleep:  Fair   Metabolic Disorder Labs: Lab Results  Component Value Date   HGBA1C 5.5 07/26/2023   MPG 96.8 06/01/2017   MPG 105 03/11/2014   No results found for: PROLACTIN Lab Results  Component Value Date   CHOL 141 10/11/2023   TRIG 151 (H) 10/11/2023   HDL 53 10/11/2023   CHOLHDL 2.7 10/11/2023   VLDL 43 (H) 06/02/2017   LDLCALC 62 10/11/2023   LDLCALC 171 (H) 03/09/2023   Lab Results  Component Value Date   TSH 0.58 04/06/2022   TSH 1.429 06/01/2017    Therapeutic Level Labs: No results found for: LITHIUM No results found for: VALPROATE No results found for: CBMZ   Screenings: GAD-7    Flowsheet Row Office Visit from 12/03/2023 in [facility] at [facility] Visit from 07/26/2023 in [facility] at [facility] Visit from 07/18/2022 in [facility]  Total GAD-7 Score 12 17 14    PHQ2-9    Flowsheet Row Office Visit from 12/03/2023 in [facility] at [facility] Video Visit from 09/03/2023 in [facility] at [facility] Visit from 07/26/2023 in [facility] at [facility] Visit from 07[facility]18/2022  [facility]  PHQ-2 Total Score 4 1 4 2   PHQ-9 Total Score -- 4 23 12    Flowsheet Row Admission (Discharged) from 04/16/2024 in [facility] CARDIAC CATH [facility] UC from 04/09/2024 in [facility]) UC from 01/01/2023 in [facility] at [facility])  C-SSRS RISK CATEGORY No Risk No Risk No Risk    Collaboration of Care: Collaboration of Care: Other provider involved in patient's care AEB PCP and neurology chart review  Patient/Guardian was advised Release of Information must be obtained prior to any record release in order to collaborate their care with an outside provider. Patient/Guardian was advised if they have not already done so to contact the registration department to sign all necessary forms in order for us  to release information regarding their care.   Consent: Patient/Guardian gives verbal consent for treatment and assignment of benefits for services provided during this visit. Patient/Guardian expressed understanding and agreed to proceed.    Arvella CHRISTELLA Finder, MD 05/29/2024, 9:14 AM   Virtual Visit via Video Note  I connected with Mitchell [name] on 05/29/2024 at  8:30 AM EST by a video enabled telemedicine application and verified that I am speaking with the correct person using two identifiers.  Location: Patient: Home Provider: Home Office   I discussed the limitations of evaluation and management by telemedicine and the availability of in person appointments. The patient expressed understanding and agreed to proceed.   I discussed the assessment and treatment plan with the patient. The patient was provided an opportunity to ask questions and all were answered. The patient agreed with the plan and demonstrated an understanding of the instructions.   The patient was advised to call back or seek an in-person evaluation if the symptoms worsen or if the condition  fails to improve as anticipated.  I provided 25 minutes of non-face-to-face time during this encounter.  Arvella CHRISTELLA Finder, MD

## 2024-05-29 ENCOUNTER — Telehealth (HOSPITAL_COMMUNITY): Admitting: Psychiatry

## 2024-05-29 ENCOUNTER — Encounter (HOSPITAL_COMMUNITY): Payer: Self-pay | Admitting: Psychiatry

## 2024-05-29 DIAGNOSIS — F411 Generalized anxiety disorder: Secondary | ICD-10-CM | POA: Diagnosis not present

## 2024-05-29 MED ORDER — HYDROXYZINE HCL 25 MG PO TABS: 25.0000 mg | ORAL_TABLET | Freq: Three times a day (TID) | ORAL | 0 refills | Status: AC | PRN

## 2024-05-29 MED ORDER — ESCITALOPRAM OXALATE 5 MG PO TABS: 5.0000 mg | ORAL_TABLET | Freq: Every day | ORAL | 0 refills | Status: AC

## 2024-06-04 ENCOUNTER — Ambulatory Visit: Admitting: Family Medicine

## 2024-06-04 ENCOUNTER — Telehealth: Admitting: Family Medicine

## 2024-06-04 ENCOUNTER — Encounter: Payer: Self-pay | Admitting: Family Medicine

## 2024-06-04 VITALS — BP 151/79 | HR 63 | Ht 70.0 in | Wt 195.0 lb

## 2024-06-04 DIAGNOSIS — I25118 Atherosclerotic heart disease of native coronary artery with other forms of angina pectoris: Secondary | ICD-10-CM | POA: Diagnosis not present

## 2024-06-04 DIAGNOSIS — F331 Major depressive disorder, recurrent, moderate: Secondary | ICD-10-CM

## 2024-06-04 DIAGNOSIS — I5032 Chronic diastolic (congestive) heart failure: Secondary | ICD-10-CM

## 2024-06-04 DIAGNOSIS — R350 Frequency of micturition: Secondary | ICD-10-CM | POA: Insufficient documentation

## 2024-06-04 DIAGNOSIS — F1021 Alcohol dependence, in remission: Secondary | ICD-10-CM

## 2024-06-04 NOTE — Assessment & Plan Note (Addendum)
 Recent catheterization showed mild blockage. Symptoms improved with increased Imdur . Furosemide  reduced edema. - Continue current cardiac medications. - advised pt he can discuss with cardiologist adjusting furosemide  to as-needed basis if urinary symptoms persist and heart function is stable.

## 2024-06-04 NOTE — Progress Notes (Signed)
" ° °  Established Patient Office Visit  Subjective   Patient ID: Mitchell [name], male    DOB: May 12, 1961  Age: 64 y.o. MRN: [id number]  No chief complaint on file.  Pt location: home Provider location: [facility] Method: video Duration: 22 min  History of Present Illness   Mitchell [name] is a 64 year old male with coronary artery disease who presents with urinary frequency and incontinence.  Since his last visit, he underwent a cardiac catheterization that showed mild blockage. His chest pain and shortness of breath have improved, though he still has mild intermittent pain. He reports his Imdur  dose was increased.  For about a year he has had urinary urgency, frequency, and incontinence that have recently worsened. He has sudden urges with dribbling and wetting his clothes and sometimes cannot reach the bathroom in time. He wakes multiple times at night to urinate. He takes furosemide  every morning, and his urinary frequency is similar day and night.  A PSA test 10 months ago was normal. He notes his maternal grandfather had similar urinary frequency, especially at night.  He continues furosemide  each morning after breakfast, which controls his lower leg swelling, the original indication for the medication.          The ASCVD Risk score (Arnett DK, et al., 2019) failed to calculate for the following reasons:   Risk score cannot be calculated because patient has a medical history suggesting prior/existing ASCVD   * - Cholesterol units were assumed  Health Maintenance Due  Topic Date Due   Medicare Annual Wellness (AWV)  Never done      Objective:     BP (!) 151/79   Pulse 63   Ht 5' 10 (1.778 m)   Wt 195 lb (88.5 kg)   BMI 27.98 kg/m    Physical Exam     Gen: alert, oriented Pulm: no respiratory distress Psych: pleasant affect       No results found for any visits on 06/04/24.      Assessment & Plan:   Urinary frequency Assessment &  Plan: Benign prostatic hyperplasia with lower urinary tract symptoms Symptoms consistent with BPH. Normal PSA reduces prostate cancer likelihood. Differential includes overactive bladder, but BPH is most common. - Prescribed Cardura  or Flomax once daily in the morning. - Monitor response and adjust dosage if necessary. - Consider alternative treatments for overactive bladder if symptoms persist or worsen.   Coronary artery disease of native artery of native heart with stable angina pectoris Assessment & Plan: Recent catheterization showed mild blockage. Symptoms improved with increased Imdur . Furosemide  reduced edema. - Continue current cardiac medications. - Discuss with cardiologist adjusting furosemide  to as-needed basis once fluid status is stable.        No follow-ups on file.    Toribio MARLA Slain, MD   "

## 2024-06-04 NOTE — Assessment & Plan Note (Addendum)
 Benign prostatic hyperplasia with lower urinary tract symptoms Symptoms consistent with BPH. Normal PSA reduces prostate cancer likelihood. Consider OAB or medication (furosemide ) as alternative causes if not improving with cardura   - Prescribed Cardura   once daily in the morning. - Monitor response and adjust dosage if necessary. - Consider alternative treatments for overactive bladder if symptoms persist or worsen.

## 2024-06-05 DIAGNOSIS — F331 Major depressive disorder, recurrent, moderate: Secondary | ICD-10-CM | POA: Insufficient documentation

## 2024-06-05 DIAGNOSIS — I5032 Chronic diastolic (congestive) heart failure: Secondary | ICD-10-CM | POA: Insufficient documentation

## 2024-06-05 MED ORDER — DOXAZOSIN MESYLATE 2 MG PO TABS: 2.0000 mg | ORAL_TABLET | Freq: Every day | ORAL | 1 refills | Status: AC

## 2024-06-05 NOTE — Assessment & Plan Note (Signed)
 Followed by cardiology.  Continue metoprolol , arb, lasix .

## 2024-06-05 NOTE — Assessment & Plan Note (Signed)
 Continue lexapro  5mg .  Mood overall seems improved from recent visits.

## 2024-06-05 NOTE — Assessment & Plan Note (Signed)
 Continues to remain abstinent from alcohol

## 2024-08-12 ENCOUNTER — Telehealth (HOSPITAL_COMMUNITY): Admitting: Psychiatry
